# Patient Record
Sex: Male | Born: 1935 | Race: White | Hispanic: No | Smoking: Former smoker
Health system: Southern US, Community
[De-identification: ages and names within clinical notes are randomized; demographics above are authoritative.]

## PROBLEM LIST (undated history)

## (undated) DIAGNOSIS — I639 Cerebral infarction, unspecified: Secondary | ICD-10-CM

## (undated) DIAGNOSIS — M1990 Unspecified osteoarthritis, unspecified site: Secondary | ICD-10-CM

## (undated) DIAGNOSIS — C61 Malignant neoplasm of prostate: Secondary | ICD-10-CM

## (undated) DIAGNOSIS — F0390 Unspecified dementia without behavioral disturbance: Secondary | ICD-10-CM

## (undated) DIAGNOSIS — K819 Cholecystitis, unspecified: Secondary | ICD-10-CM

## (undated) DIAGNOSIS — K810 Acute cholecystitis: Secondary | ICD-10-CM

## (undated) DIAGNOSIS — C4491 Basal cell carcinoma of skin, unspecified: Secondary | ICD-10-CM

## (undated) DIAGNOSIS — S0990XA Unspecified injury of head, initial encounter: Secondary | ICD-10-CM

## (undated) DIAGNOSIS — F329 Major depressive disorder, single episode, unspecified: Secondary | ICD-10-CM

## (undated) DIAGNOSIS — D4989 Neoplasm of unspecified behavior of other specified sites: Secondary | ICD-10-CM

## (undated) DIAGNOSIS — F32A Depression, unspecified: Secondary | ICD-10-CM

## (undated) DIAGNOSIS — D150 Benign neoplasm of thymus: Secondary | ICD-10-CM

## (undated) DIAGNOSIS — E213 Hyperparathyroidism, unspecified: Secondary | ICD-10-CM

## (undated) HISTORY — DX: Benign neoplasm of thymus: D15.0

## (undated) HISTORY — DX: Neoplasm of unspecified behavior of other specified sites: D49.89

## (undated) HISTORY — PX: PROSTATECTOMY: SHX69

## (undated) HISTORY — DX: Acute cholecystitis: K81.0

---

## 1992-07-26 HISTORY — PX: COLON SURGERY: SHX602

## 2007-10-29 ENCOUNTER — Inpatient Hospital Stay (HOSPITAL_COMMUNITY): Admission: EM | Admit: 2007-10-29 | Discharge: 2007-11-01 | Payer: Self-pay | Admitting: Emergency Medicine

## 2007-10-29 ENCOUNTER — Ambulatory Visit: Payer: Self-pay | Admitting: Psychiatry

## 2007-10-30 ENCOUNTER — Encounter (INDEPENDENT_AMBULATORY_CARE_PROVIDER_SITE_OTHER): Payer: Self-pay | Admitting: Internal Medicine

## 2007-11-01 ENCOUNTER — Ambulatory Visit: Payer: Self-pay | Admitting: Psychiatry

## 2010-12-08 NOTE — Discharge Summary (Signed)
NAME:  Jose Crawford, Jose Crawford NO.:  [id number]   MEDICAL RECORD NO.:  [id number]          PATIENT TYPE:  INP   LOCATION:  1436                         FACILITY:  [facility]   PHYSICIAN:  Madaline Savage, MD        DATE OF BIRTH:  1925/12/16   DATE OF ADMISSION:  10/29/2007  DATE OF DISCHARGE:  11/01/2007                               DISCHARGE SUMMARY   ADDENDUM:  Please see the last discharge summary for a complete list of  discharge diagnoses and discharge medications.   Mr. Rodgers was admitted here with altered mental status and depression.  He was initially seen by psychiatry, Dr. Electa Sniff, and at that time he  agreed for a voluntary inpatient psych admission, but at this time the  patient has changed his mind and he would like to go home.  I discussed  this with Dr. Electa Sniff, the psychiatrist, who recommended that he can be  discharged home if he is not suicidal.  I have discussed it with the  patient.  He states his depression has improved tremendously.  He states  he has a lot of support at home and he will be watched closely at home.  He denies being suicidal.  He feels he will be all right if he goes  home.  I have also discussed it with his daughter, [name], and she also  agrees to keep a close eye on him.  He has an appointment to see the  psychiatrist on April 14.  He is now being discharged to home in stable  condition.      Madaline Savage, MD  Electronically Signed     PKN/MEDQ  D:  11/01/2007  T:  11/01/2007  Job:  [id number]

## 2010-12-08 NOTE — Discharge Summary (Signed)
NAMEMarland Kitchen  Jose Crawford, Jose Crawford NO.:  [id number]   MEDICAL RECORD NO.:  [id number]          PATIENT TYPE:  INP   LOCATION:  1436                         FACILITY:  [facility]   PHYSICIAN:  Ladell Pier, M.D.   DATE OF BIRTH:  05-Aug-1925   DATE OF ADMISSION:  10/29/2007  DATE OF DISCHARGE:  11/01/2007                               DISCHARGE SUMMARY   DISCHARGE DIAGNOSIS:  1. Confusion - altered mental status.  2. Severe depression.  3. History of prostate cancer in the past.  4. Previous head injury.  5. Mildly elevated relative index on the cardiac enzymes.  No chest      pain, no shortness of breath.   DISCHARGE MEDICATIONS:  1. Lexapro discontinued. Discharge psych meds to be determined by      psychiatry.  2. Oxybutynin 5 mg t.i.d.  3. Zantac 150 mg daily.  4. Aspirin 81 mg daily.  5. Trazodone 100 mg q.h.s.   FOLLOW-UP APPOINTMENTS:  The patient had an appointment to follow up  with his psychiatrist on April 14. The patient to follow up with primary  care physician in 1 week.  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   PROCEDURES:  None.   CONSULTANTS:  Psychiatry.   HISTORY OF PRESENT ILLNESS:  The patient is an 77 year old white male  that was brought to the emergency department secondary to worsening  confusion and depressed mood.  His family reports that he has had  progressive symptoms since he was placed on Lexapro when he was seen at  [facility] in [place].  The patient reports feeling  severely depressed and reports hurting all over and cannot localize the  pain.  The patient's wife is very ill with cancer and since then the  patient was the primary caretaker for his wife and his wife is now in  the hospital at [facility].  Please see admission note for remainder of  history, past medical history, family history, social history, meds,  allergies, review of systems per admission H&P.   PHYSICAL EXAMINATION ON DISCHARGE:  Temperature  98.1, pulse 80,  respirations 20, blood pressure 106/69, pulse ox 95% on room air.  HEENT:  Normocephalic atraumatic.  Pupils reactive to light. Throat  without erythema.  CARDIOVASCULAR:  Regular rate and rhythm.  LUNGS:  Clear bilaterally.  ABDOMEN:  Positive bowel sounds.  EXTREMITIES:  No edema.  NEUROLOGIC:  Nonfocal.   HOSPITAL COURSE:  Confusion/severe depression:  The patient was admitted  to the hospital and he had labs work done to rule out metabolic causes  for his mental status changes.  He also had a CT scan that recommended  follow-up MRI.  The followup MRI/MRA did not show any acute stroke but  old cerebellar infarct.  He had mild __________ .  Discussed with  daughter to follow up with this as outpatient.  The patient does not  have any chest pain or shortness of breath or any cardiac symptoms.  The  patient will also continue taking his oxybutynin.  Will continue the  trazodone.  Will defer  what psych meds to discharge the patient on with  psychiatry. Urinalysis shows 20,000 colonies of multi bacterial  morphogens present. Folate level 938, PT 12.9, INR 1.0.  Alcohol level  less than 5.  CMP, sodium 135, potassium 3.5, chloride 101,  CO2 27,  glucose 114, BUN 14, creatinine 0.95.  LFTs normal.  WBC 6.8, hemoglobin  13.3, platelets 260.  TSH 1.029, troponin 0.03, CK 111, MB 3.3, relative  index of 3.0, RPR nonreactive.  Vitamin B12 level 302.  Troponin level  0.02. CK 126, MB 4.4, relative index 3.5. First set of enzymes, CK 121,  MB 4.6. Relative index 3.8. Urinalysis negative. MRI/MRA of the brain  showed no acute abnormality, remote infarct involving the right  cerebellum.      Ladell Pier, M.D.  Electronically Signed     NJ/MEDQ  D:  10/31/2007  T:  10/31/2007  Job:  [id number]

## 2010-12-08 NOTE — H&P (Signed)
NAME:  Jose Crawford, Jose Crawford NO.:  [id number]   MEDICAL RECORD NO.:  [id number]          PATIENT TYPE:  INP   LOCATION:  1436                         FACILITY:  [facility]   PHYSICIAN:  Della Goo, M.D. DATE OF BIRTH:  [birth year]   DATE OF ADMISSION:  10/29/2007  DATE OF DISCHARGE:                              HISTORY & PHYSICAL   PRIMARY CARE PHYSICIAN:  Unassigned.  This patient receives his medical  care at the [facility].   CHIEF COMPLAINT:  Increased confusion.   HISTORY OF PRESENT ILLNESS:  This is an 77 year old male who was brought  to the emergency department secondary to worsening confusion and  depressed mood.  His family reports he has had progressive symptoms  since he was placed on Lexapro medication when he was seen at a  [facility] in [place].  The patient, himself,  reports feeling severely depressed and reports hurting all over and he  cannot localized pain, but reports he hurts in the inside.  Also of  note, the patient's wife has become severely ill recently and this  patient was the primary caretaker for his wife, who is now hospitalized  in [facility].  The patient also reports having decreased p.o.  intake of food and fluids secondary to his depressed mood.  The patient  denies being suicidal or having any kind of suicidal ideation.   PAST MEDICAL HISTORY:  Significant for:  1. History of prostate cancer in the past.  2. Previous head injury.  3. History of depression.   MEDICATIONS:  Will be further verified.   ALLERGIES:  NO KNOWN DRUG ALLERGIES.   SOCIAL HISTORY:  The patient is a nonsmoker, nondrinker   FAMILY HISTORY:  Noncontributory.   REVIEW OF SYSTEMS:  The patient denies having any shortness of breath.  He does report having weakness.  He denies having any nausea, vomiting,  diarrhea or constipation.  He denies chest pain, but does report having  pain all over which has not been quantified or  qualified.   PHYSICAL EXAMINATION:  GENERAL:  This is an 77 year old, well-nourished,  well-developed male who is tearful, but in no acute distress.  VITAL SIGNS:  Temperature 97.6, blood pressure 118/65, heart rate 68,  respirations 20, O2 saturations 97% on room air. HEENT:  Normocephalic,  atraumatic.  There is no scleral icterus.  Pupils are equally round and  reactive to light.  Extraocular muscles are intact.  Funduscopic benign.  Oropharynx is clear.  NECK:  Supple.  Full range of motion.  No thyromegaly, adenopathy or  jugulovenous distention.  CARDIOVASCULAR:  Regular rate and rhythm.  No murmurs, gallops or rubs.  LUNGS:  Clear to auscultation bilaterally.  ABDOMEN:  Positive bowel sounds.  Soft, nontender and nondistended.  EXTREMITIES:  Without cyanosis, clubbing or edema.  NEUROLOGIC:  The patient's mood is severely depressed.  His speech is  clear.  There are signs of psychomotor retardation, but otherwise his  examination is nonfocal.   LABORATORY STUDIES:  Chemistry with a sodium of 135, potassium 3.5,  chloride 101, bicarb 27, BUN 14,  creatinine 0.95 and glucose 114.  Albumin 3.6, AST 21, ALT 14.  Pro time 12.9, INR 1.0.  Urinalysis  negative.  Cardiac enzymes with a myoglobin of 114, CK-MB of 2.2 and  troponin less than 0.05.   ASSESSMENT:  This is an 77 year old male being admitted with:  1. Confusion/altered mental status.  2. Severe depression.  3. Mild dehydration.   PLAN:  The patient will be admitted to a telemetry area and cardiac  enzymes will continue to be performed.  IV fluids have been ordered for  rehydration and maintenance therapy.  His medications will be placed on  hold for now.  No further Lexapro will be ordered until further  psychiatric evaluation.  A delirium dementia workup will also be  initiated.  DVT and GI prophylaxis have been ordered.      Della Goo, M.D.  Electronically Signed     HJ/MEDQ  D:  10/29/2007  T:   10/30/2007  Job:  [id number]

## 2010-12-08 NOTE — Consult Note (Signed)
NAME:  Jose Crawford, Jose Crawford NO.:  [id number]   MEDICAL RECORD NO.:  [id number]          PATIENT TYPE:  INP   LOCATION:  1436                         FACILITY:  [facility]   PHYSICIAN:  Anselm Jungling, MD  DATE OF BIRTH:  November 14, 1925   DATE OF CONSULTATION:  11/01/2007  DATE OF DISCHARGE:                                 CONSULTATION   IDENTIFYING DATA AND REASON FOR REFERRAL:  The patient is an 77 year old  married Caucasian male currently being cared for by the [facility]  here at [facility].  He was admitted due to confusion and  altered mental status.  Psychiatric consultation is requested to assess  mental status and make recommendations.   HISTORY OF THE PRESENTING PROBLEMS:  The patient, for unclear reasons,  was started on a trial of Lexapro, the antidepressant, some time in the  recent past, at a behavioral health center in [facility].  This may  have been the [employer].  He had been feeling depressed in  relation to his wife being hospitalized with a severe illness.  He had  had decreased intake of food and fluid.  Apparently, the patient  experienced further deterioration in mental status changes in relation  to Lexapro therapy.  He was admitted here to [facility]  because of his alterations in mental status, and failure to thrive.   He also has a history of prostate cancer in the past, old head injury,  but otherwise is in reasonably good health for a gentleman his age.  Consultation was requested to assess mental status and make  recommendations around the issue of depression and medication for  depression.   MENTAL STATUS AND OBSERVATIONS:  I met the patient on the third hospital  day, as he was preparing to be discharged home.  He was getting dressed,  and a sitter and was nearby.  He is alert, fully oriented, and very  pleasant and friendly.  He was an extremely pleasant gentleman to talk  to.  He appeared to be  in good spirits, and stated that he felt very  well.  He talked about how he believes that the Lexapro was a medication  that had detrimental effects, and that he feels much better now that he  is off it.   We talked about his background and his life.  He lives with his wife and  other family.  He is retired, but still has a lot of energy, and wishes  he could still work.   There was nothing to suggest any underlying psychosis, thought disorder,  confusion, delirium or any other cognitive abnormality.  He is looking  forward to going home.  He denies current depression and suicidal  ideation.   Gentleman who apparently became somewhat depressed in relation to his  wife's severe illness.  He then seemed to have a adverse response to  Lexapro, with alterations in mental status and failure to thrive.  He  appears to be doing very well right now.  I do not see any indication  for any further medication for  depression, both on the basis of his  clinical appearance, and the worrisome reaction that he had to Lexapro.   He does not really appear to need any particular outpatient psychiatric  followup at this time, although it could certainly be an option if his  depression were to return.   CT and MRI scan did show evidence of old cerebellar infarct, but no  acute changes.  There is nothing to suggest any kind of acute process  neurological or psychological.  As such, I am comfortable with the  patient proceeding with his discharge home today.   DIAGNOSTIC IMPRESSION:  AXIS I:  Adjustment disorder with depressed  mood, status post confusion, secondary to medication.  AXIS II:  Deferred.  AXIS III:  History of prostate cancer.  AXIS IV:  Stressors, severe.  AXIS V:  GAF 75.   RECOMMENDATIONS:  As above.  Thank you for involving me in this  patient's care.  Cell phone [phone].      Anselm Jungling, MD  Electronically Signed     SPB/MEDQ  D:  11/01/2007  T:  11/01/2007  Job:   [id number]

## 2011-04-20 LAB — COMPREHENSIVE METABOLIC PANEL
ALT: 14
AST: 21
Albumin: 3.6
Alkaline Phosphatase: 60
GFR calc Af Amer: >60
Potassium: 3.5
Sodium: 135
Total Protein: 5.8 — ABNORMAL LOW

## 2011-04-20 LAB — CK TOTAL AND CKMB (NOT AT ARMC)
CK, MB: 3.3
CK, MB: 4.4 — ABNORMAL HIGH
Relative Index: 3 — ABNORMAL HIGH
Relative Index: 3.8 — ABNORMAL HIGH

## 2011-04-20 LAB — FOLATE RBC: RBC Folate: 938 — ABNORMAL HIGH

## 2011-04-20 LAB — DIFFERENTIAL
Basophils Relative: 0
Monocytes Absolute: 0.5
Monocytes Relative: 8
Neutro Abs: 5.1

## 2011-04-20 LAB — ETHANOL: Alcohol, Ethyl (B): <5

## 2011-04-20 LAB — URINALYSIS, ROUTINE W REFLEX MICROSCOPIC
Protein, ur: NEGATIVE
Urobilinogen, UA: 1

## 2011-04-20 LAB — URINE CULTURE

## 2011-04-20 LAB — CBC
Platelets: 260
RDW: 13.1

## 2011-04-20 LAB — POCT CARDIAC MARKERS: CKMB, poc: 2.2

## 2011-04-20 LAB — TROPONIN I
Troponin I: 0.02
Troponin I: 0.03

## 2011-04-20 LAB — RPR: RPR Ser Ql: NONREACTIVE

## 2011-11-24 DIAGNOSIS — K810 Acute cholecystitis: Secondary | ICD-10-CM

## 2011-11-24 HISTORY — DX: Acute cholecystitis: K81.0

## 2011-12-02 ENCOUNTER — Emergency Department (HOSPITAL_COMMUNITY): Payer: Medicare Other

## 2011-12-02 ENCOUNTER — Inpatient Hospital Stay (HOSPITAL_COMMUNITY)
Admission: EM | Admit: 2011-12-02 | Discharge: 2011-12-09 | DRG: 444 | Disposition: A | Discharge status: Home / Self Care | Payer: Medicare Other | Attending: Internal Medicine | Admitting: Internal Medicine

## 2011-12-02 ENCOUNTER — Inpatient Hospital Stay (HOSPITAL_COMMUNITY): Payer: Medicare Other

## 2011-12-02 ENCOUNTER — Encounter (HOSPITAL_COMMUNITY): Payer: Self-pay

## 2011-12-02 DIAGNOSIS — N200 Calculus of kidney: Secondary | ICD-10-CM | POA: Diagnosis present

## 2011-12-02 DIAGNOSIS — K810 Acute cholecystitis: Secondary | ICD-10-CM

## 2011-12-02 DIAGNOSIS — I509 Heart failure, unspecified: Secondary | ICD-10-CM | POA: Diagnosis present

## 2011-12-02 DIAGNOSIS — R222 Localized swelling, mass and lump, trunk: Secondary | ICD-10-CM | POA: Diagnosis present

## 2011-12-02 DIAGNOSIS — C61 Malignant neoplasm of prostate: Secondary | ICD-10-CM | POA: Diagnosis present

## 2011-12-02 DIAGNOSIS — R918 Other nonspecific abnormal finding of lung field: Secondary | ICD-10-CM | POA: Diagnosis present

## 2011-12-02 DIAGNOSIS — A419 Sepsis, unspecified organism: Secondary | ICD-10-CM

## 2011-12-02 DIAGNOSIS — F0390 Unspecified dementia without behavioral disturbance: Secondary | ICD-10-CM | POA: Diagnosis present

## 2011-12-02 DIAGNOSIS — I5042 Chronic combined systolic (congestive) and diastolic (congestive) heart failure: Secondary | ICD-10-CM | POA: Diagnosis present

## 2011-12-02 DIAGNOSIS — D72829 Elevated white blood cell count, unspecified: Secondary | ICD-10-CM | POA: Diagnosis present

## 2011-12-02 DIAGNOSIS — Z8546 Personal history of malignant neoplasm of prostate: Secondary | ICD-10-CM

## 2011-12-02 DIAGNOSIS — K8000 Calculus of gallbladder with acute cholecystitis without obstruction: Secondary | ICD-10-CM

## 2011-12-02 DIAGNOSIS — Z87891 Personal history of nicotine dependence: Secondary | ICD-10-CM

## 2011-12-02 HISTORY — DX: Basal cell carcinoma of skin, unspecified: C44.91

## 2011-12-02 HISTORY — DX: Malignant neoplasm of prostate: C61

## 2011-12-02 LAB — CBC
HCT: 40.2 % (ref 39.0–52.0)
Hemoglobin: 13.7 g/dL (ref 13.0–17.0)
MCH: 31.4 pg (ref 26.0–34.0)
MCHC: 34.1 g/dL (ref 30.0–36.0)
MCV: 92 fL (ref 78.0–100.0)

## 2011-12-02 LAB — URINALYSIS, ROUTINE W REFLEX MICROSCOPIC
Glucose, UA: NEGATIVE mg/dL
Leukocytes, UA: NEGATIVE
Protein, ur: 30 mg/dL — AB

## 2011-12-02 LAB — DIFFERENTIAL
Basophils Relative: 0 % (ref 0–1)
Eosinophils Absolute: 0 10*3/uL (ref 0.0–0.7)
Eosinophils Relative: 0 % (ref 0–5)
Monocytes Absolute: 1.4 10*3/uL — ABNORMAL HIGH (ref 0.1–1.0)
Monocytes Relative: 7 % (ref 3–12)

## 2011-12-02 LAB — COMPREHENSIVE METABOLIC PANEL
Albumin: 3.6 g/dL (ref 3.5–5.2)
BUN: 18 mg/dL (ref 6–23)
Chloride: 99 mEq/L (ref 96–112)
Creatinine, Ser: 1.05 mg/dL (ref 0.50–1.35)
Total Bilirubin: 1.3 mg/dL — ABNORMAL HIGH (ref 0.3–1.2)
Total Protein: 6.6 g/dL (ref 6.0–8.3)

## 2011-12-02 LAB — URINE MICROSCOPIC-ADD ON

## 2011-12-02 LAB — LACTIC ACID, PLASMA: Lactic Acid, Venous: 1.5 mmol/L (ref 0.5–2.2)

## 2011-12-02 LAB — LIPASE, BLOOD: Lipase: 13 U/L (ref 11–59)

## 2011-12-02 MED ORDER — ONDANSETRON HCL 4 MG/2ML IJ SOLN: 4.0000 mg | Freq: Four times a day (QID) | INTRAMUSCULAR | Status: DC | PRN

## 2011-12-02 MED ORDER — KCL IN DEXTROSE-NACL 20-5-0.45 MEQ/L-%-% IV SOLN
INTRAVENOUS | Status: DC
  Administered 2011-12-02 – 2011-12-05 (×3): via INTRAVENOUS
  Filled 2011-12-02 (×9): qty 1000

## 2011-12-02 MED ORDER — DIPHENHYDRAMINE HCL 12.5 MG/5ML PO ELIX
12.5000 mg | ORAL_SOLUTION | Freq: Four times a day (QID) | ORAL | Status: DC | PRN
  Filled 2011-12-02: qty 5

## 2011-12-02 MED ORDER — DIPHENHYDRAMINE HCL 50 MG/ML IJ SOLN: 12.5000 mg | Freq: Four times a day (QID) | INTRAMUSCULAR | Status: DC | PRN

## 2011-12-02 MED ORDER — SODIUM CHLORIDE 0.9 % IV BOLUS (SEPSIS)
1000.0000 mL | Freq: Once | INTRAVENOUS | Status: AC
  Administered 2011-12-02: 1000 mL via INTRAVENOUS

## 2011-12-02 MED ORDER — PANTOPRAZOLE SODIUM 40 MG IV SOLR
40.0000 mg | Freq: Every day | INTRAVENOUS | Status: DC
  Administered 2011-12-02 – 2011-12-05 (×4): 40 mg via INTRAVENOUS
  Filled 2011-12-02 (×6): qty 40

## 2011-12-02 MED ORDER — PIPERACILLIN-TAZOBACTAM 3.375 G IVPB
3.3750 g | Freq: Once | INTRAVENOUS | Status: AC
  Administered 2011-12-02: 3.375 g via INTRAVENOUS
  Filled 2011-12-02: qty 50

## 2011-12-02 MED ORDER — ACETAMINOPHEN 325 MG PO TABS
650.0000 mg | ORAL_TABLET | Freq: Once | ORAL | Status: AC
  Administered 2011-12-02: 650 mg via ORAL
  Filled 2011-12-02: qty 2

## 2011-12-02 MED ORDER — ONDANSETRON HCL 4 MG/2ML IJ SOLN
4.0000 mg | Freq: Once | INTRAMUSCULAR | Status: AC
  Administered 2011-12-02: 4 mg via INTRAVENOUS
  Filled 2011-12-02: qty 2

## 2011-12-02 MED ORDER — IOHEXOL 300 MG/ML  SOLN
100.0000 mL | Freq: Once | INTRAMUSCULAR | Status: AC | PRN
  Administered 2011-12-02: 100 mL via INTRAVENOUS

## 2011-12-02 MED ORDER — MORPHINE SULFATE 2 MG/ML IJ SOLN
1.0000 mg | INTRAMUSCULAR | Status: DC | PRN
  Administered 2011-12-02 – 2011-12-08 (×8): 2 mg via INTRAVENOUS
  Filled 2011-12-02 (×8): qty 1

## 2011-12-02 MED ORDER — IOHEXOL 300 MG/ML  SOLN
20.0000 mL | INTRAMUSCULAR | Status: AC
  Administered 2011-12-02 (×2): 20 mL via ORAL

## 2011-12-02 MED ORDER — SODIUM CHLORIDE 0.9 % IV SOLN
Freq: Once | INTRAVENOUS | Status: AC
  Administered 2011-12-02: 17:00:00 via INTRAVENOUS

## 2011-12-02 MED ORDER — PIPERACILLIN-TAZOBACTAM 3.375 G IVPB
3.3750 g | Freq: Three times a day (TID) | INTRAVENOUS | Status: DC
  Administered 2011-12-02 – 2011-12-05 (×9): 3.375 g via INTRAVENOUS
  Filled 2011-12-02 (×11): qty 50

## 2011-12-02 NOTE — ED Provider Notes (Signed)
History     CSN: [id number]  Arrival date & time 12/02/11  1231   First MD Initiated Contact with Patient 12/02/11 1243      Chief Complaint  Patient presents with  . Shortness of Breath    (Consider location/radiation/quality/duration/timing/severity/associated sxs/prior treatment) HPI Comments: Patient presents with 2 days of abdominal pain, shortness of breath, vomiting. Had 2 episodes of vomiting yesterday morning but none since. He complains of right-sided abdominal pain shortness of breath and cough. He denies any chest pain. Do not think any fevers at home. He is a history of prostate cancer and skin cancers. He denies any leg pain or swelling. He admits to poor by mouth intake denies any diarrhea or constipation.  The history is provided by the patient and a relative.    Past Medical History  Diagnosis Date  . Basal cell carcinoma   . Prostate cancer     History reviewed. No pertinent past surgical history.  History reviewed. No pertinent family history.  History  Substance Use Topics  . Smoking status: Never Smoker   . Smokeless tobacco: Not on file  . Alcohol Use: No      Review of Systems  Constitutional: Positive for fever, activity change, appetite change and fatigue.  HENT: Negative for congestion and rhinorrhea.   Eyes: Negative for visual disturbance.  Respiratory: Positive for cough, chest tightness and shortness of breath.   Cardiovascular: Negative for chest pain.  Gastrointestinal: Positive for nausea, vomiting and abdominal pain.  Genitourinary: Negative for dysuria and hematuria.  Musculoskeletal: Negative for back pain.  Skin: Negative for rash.  Neurological: Negative for dizziness and headaches.    Allergies  Review of patient's allergies indicates no known allergies.  Home Medications   Current Outpatient Rx  Name Route Sig Dispense Refill  . ACETAMINOPHEN 500 MG PO TABS Oral Take 1,000 mg by mouth every 6 (six) hours as needed. For  pain      BP 110/62  Pulse 89  Temp(Src) 98 F (36.7 C) (Oral)  Resp 20  SpO2 95%  Physical Exam  Constitutional: He is oriented to person, place, and time. He appears well-developed and well-nourished. No distress.  HENT:  Head: Normocephalic and atraumatic.  Mouth/Throat: Oropharynx is clear and moist. No oropharyngeal exudate.  Eyes: Conjunctivae are normal. Pupils are equal, round, and reactive to light.  Neck: Normal range of motion. Neck supple.       No meningismus  Cardiovascular: Normal rate, regular rhythm and normal heart sounds.   Pulmonary/Chest: Effort normal and breath sounds normal. No respiratory distress.  Abdominal: Soft. There is tenderness. There is guarding. There is no rebound.       TTP RUQ, periumbilical, RLQ  Musculoskeletal: He exhibits no edema and no tenderness.  Neurological: He is alert and oriented to person, place, and time. No cranial nerve deficit.  Skin: Skin is warm.    ED Course  Procedures (including critical care time)  Labs Reviewed  CBC - Abnormal; Notable for the following:    WBC 20.9 (*)    All other components within normal limits  DIFFERENTIAL - Abnormal; Notable for the following:    Neutrophils Relative 89 (*)    Neutro Abs 18.5 (*)    Lymphocytes Relative 5 (*)    Monocytes Absolute 1.4 (*)    All other components within normal limits  COMPREHENSIVE METABOLIC PANEL - Abnormal; Notable for the following:    Sodium 134 (*)    Glucose, Bld 118 (*)  Calcium 11.5 (*)    Total Bilirubin 1.3 (*)    GFR calc non Af Amer 63 (*)    GFR calc Af Amer 73 (*)    All other components within normal limits  URINALYSIS, ROUTINE W REFLEX MICROSCOPIC - Abnormal; Notable for the following:    Color, Urine AMBER (*) BIOCHEMICALS MAY BE AFFECTED BY COLOR   APPearance HAZY (*)    Hgb urine dipstick LARGE (*)    Ketones, ur 15 (*)    Protein, ur 30 (*)    All other components within normal limits  URINE MICROSCOPIC-ADD ON - Abnormal;  Notable for the following:    Casts HYALINE CASTS (*)    All other components within normal limits  TROPONIN I  LACTIC ACID, PLASMA  PROTIME-INR  LIPASE, BLOOD   US Abdomen Complete  12/02/2011  *RADIOLOGY REPORT*  Clinical Data:  Right upper quadrant pain and fever  ABDOMINAL ULTRASOUND COMPLETE  Comparison:  None.  Findings:  Gallbladder:   There are multiple small gallstones and tumefactive sludge within the gallbladder lumen.  There is gallbladder wall thickening and pericholecystic fluid.  There is dirty shadowing from a portion of the gallbladder wall which is worrisome for gas.  Common Bile Duct:  Within normal limits in caliber.  Measures 4 mm.  Liver: No focal mass lesion identified.  Within normal limits in parenchymal echogenicity.  IVC:  Appears normal.  Pancreas:  No abnormality identified.  Spleen:  Within normal limits in size and echotexture.  Right kidney:  Normal in size and parenchymal echogenicity.  No evidence of mass or hydronephrosis.  Left kidney:  Normal in size and parenchymal echogenicity.  No evidence of mass or hydronephrosis.  Abdominal Aorta:  No aneurysm identified.  IMPRESSION: Cholelithiasis with cholecystitis.  Gas within a portion of the gallbladder wall is also suspected, consistent with emphysematous cholecystitis. Discussed with Dr. Manus Gunning.  Original Report Authenticated By: Brandon Melnick, M.D.   Dg Chest Portable 1 View  12/02/2011  *RADIOLOGY REPORT*  Clinical Data: Shortness of breath.  PORTABLE CHEST - 1 VIEW 12/02/2011 1324 hours:  Comparison: None.  Findings: Cardiac silhouette mildly enlarged even allowing for the AP portable technique, with mildly prominent central pulmonary arteries.  Thoracic aorta atherosclerotic.  Elevation of the right hemidiaphragm.  Linear opacities in both lung bases.  Lungs otherwise clear.  Pulmonary vascularity normal.  No visible pleural effusions.  IMPRESSION: Mild cardiomegaly.  Linear scar or atelectasis in the lung bases. No  acute cardiopulmonary disease otherwise.  Original Report Authenticated By: Arnell Sieving, M.D.     1. Emphysematous cholecystitis       MDM  Fever, abdominal pain, shortness of breath and vomiting. Right upper quadrant tenderness on exam.  Abnormal appearing mediastinum on chest x-ray.  Ultrasound shows cholecystitis with gas the gallbladder wall worrisome for emphysematous cholecystitis. Patient is given antibiotics, pain medication, surgical consult.  CT abdomen and chest pending at time of admission.    Date: 12/02/2011  Rate: 93  Rhythm: normal sinus rhythm  QRS Axis: normal  Intervals: normal  ST/T Wave abnormalities: normal  Conduction Disutrbances:none  Narrative Interpretation:   Old EKG Reviewed: unchanged  CRITICAL CARE Performed by: Glynn Octave   Total critical care time: 40  Critical care time was exclusive of separately billable procedures and treating other patients.  Critical care was necessary to treat or prevent imminent or life-threatening deterioration.  Critical care was time spent personally by me on the following activities: development  of treatment plan with patient and/or surrogate as well as nursing, discussions with consultants, evaluation of patient's response to treatment, examination of patient, obtaining history from patient or surrogate, ordering and performing treatments and interventions, ordering and review of laboratory studies, ordering and review of radiographic studies, pulse oximetry and re-evaluation of patient's condition.   Glynn Octave, MD 12/03/11 [phone]

## 2011-12-02 NOTE — Progress Notes (Signed)
ANTIBIOTIC CONSULT NOTE - INITIAL  Pharmacy Consult for Zosyn Indication: Cholecystitis  No Known Allergies  Patient Measurements:   Adjusted Body Weight  Vital Signs: Temp: 99 F (37.2 C) (05/09 1920) Temp src: Oral (05/09 1920) BP: 124/70 mmHg (05/09 1920) Pulse Rate: 91  (05/09 1920) Intake/Output from previous day:   Intake/Output from this shift:    Labs:  Basename 12/02/11 1300  WBC 20.9*  HGB 13.7  PLT 206  LABCREA --  CREATININE 1.05   CrCl is unknown because there is no height on file for the current visit. No results found for this basename: VANCOTROUGH:2,VANCOPEAK:2,VANCORANDOM:2,GENTTROUGH:2,GENTPEAK:2,GENTRANDOM:2,TOBRATROUGH:2,TOBRAPEAK:2,TOBRARND:2,AMIKACINPEAK:2,AMIKACINTROU:2,AMIKACIN:2, in the last 72 hours  Est CrCl ~50-55 ml/min  Microbiology: No results found for this or any previous visit (from the past 720 hour(s)).  Medical History: Past Medical History  Diagnosis Date  . Basal cell carcinoma   . Prostate cancer     Medications:  Anti-infectives     Start     Dose/Rate Route Frequency Ordered Stop   12/02/11 1545   piperacillin-tazobactam (ZOSYN) IVPB 3.375 g        3.375 g 12.5 mL/hr over 240 Minutes Intravenous  Once 12/02/11 1543           Assessment: Cholecystitis:  To continue empiric antibiotic therapy with Zosyn.  Renal function is normal and no dosage adjustments are anticipated.  Goal of Therapy:  Appropriate antimicrobial therapy  Plan:  Zosyn 3.375gm IV q8h extended infusion As no dosage adjustments are anticipated pharmacy will sign off.  Thank you for the consult.  Estella Husk, Pharm.D., BCPS Clinical Pharmacist  Pager [phone] 12/02/2011, 8:36 PM

## 2011-12-02 NOTE — ED Notes (Signed)
[id number] Ready 

## 2011-12-02 NOTE — ED Notes (Signed)
Complains of abd pain, labored breathing, vomiting onset Tuesday morning.

## 2011-12-02 NOTE — ED Notes (Signed)
Family at bedside. 

## 2011-12-02 NOTE — ED Notes (Signed)
MD at bedside. General Surgeon MD at bedside

## 2011-12-02 NOTE — ED Notes (Signed)
Patient transported to CT 

## 2011-12-02 NOTE — H&P (Signed)
Jose Crawford is an 78 y.o. male.   Chief Complaint: stomach pain HPI: This is an 78 year old Caucasian male who was in his normal state of health until around midnight on Wednesday. At that time he complained of stomach pains, nausea, and vomiting. His son-in-law also states that the patient had decreased mobility secondary to the abdominal discomfort. The patient denies any prior symptoms. He denies any fevers or chills. He denies any melena or hematochezia. He reports having a loose bowel movement today. He also reported some shortness of breath. He denies any chest pressure. He denies any weight loss. He lives with his daughter and son-in-law. He generally gets around with a walker. He denies any jaundice. He denies any difficulty swallowing. He denies any heartburn. He doesn't take any scheduled daily medication. Because the patient continued to complain of stomach pains as well as having intermittent episodes of nausea he was brought to the emergency room for further evaluation.  Past Medical History  Diagnosis Date  . Basal cell carcinoma   . Prostate cancer     Past Surgical History  Procedure Date  . Prostatectomy     History reviewed. No pertinent family history. Social History:  reports that he has never smoked. He does not have any smokeless tobacco history on file. He reports that he uses illicit drugs. He reports that he does not drink alcohol.  Allergies: No Known Allergies   (Not in a hospital admission)  Results for orders placed during the hospital encounter of 12/02/11 (from the past 48 hour(s))  TROPONIN I     Status: Normal   Collection Time   12/02/11 12:55 PM      Component Value Range Comment   Troponin I <0.30  <0.30 (ng/mL)   LACTIC ACID, PLASMA     Status: Normal   Collection Time   12/02/11 12:56 PM      Component Value Range Comment   Lactic Acid, Venous 1.5  0.5 - 2.2 (mmol/L)   CBC     Status: Abnormal   Collection Time   12/02/11  1:00 PM      Component  Value Range Comment   WBC 20.9 (*) 4.0 - 10.5 (K/uL)    RBC 4.37  4.22 - 5.81 (MIL/uL)    Hemoglobin 13.7  13.0 - 17.0 (g/dL)    HCT 91.4  78.2 - 95.6 (%)    MCV 92.0  78.0 - 100.0 (fL)    MCH 31.4  26.0 - 34.0 (pg)    MCHC 34.1  30.0 - 36.0 (g/dL)    RDW 21.3  08.6 - 57.8 (%)    Platelets 206  150 - 400 (K/uL)   DIFFERENTIAL     Status: Abnormal   Collection Time   12/02/11  1:00 PM      Component Value Range Comment   Neutrophils Relative 89 (*) 43 - 77 (%)    Neutro Abs 18.5 (*) 1.7 - 7.7 (K/uL)    Lymphocytes Relative 5 (*) 12 - 46 (%)    Lymphs Abs 1.0  0.7 - 4.0 (K/uL)    Monocytes Relative 7  3 - 12 (%)    Monocytes Absolute 1.4 (*) 0.1 - 1.0 (K/uL)    Eosinophils Relative 0  0 - 5 (%)    Eosinophils Absolute 0.0  0.0 - 0.7 (K/uL)    Basophils Relative 0  0 - 1 (%)    Basophils Absolute 0.0  0.0 - 0.1 (K/uL)   COMPREHENSIVE METABOLIC PANEL  Status: Abnormal   Collection Time   12/02/11  1:00 PM      Component Value Range Comment   Sodium 134 (*) 135 - 145 (mEq/L)    Potassium 4.7  3.5 - 5.1 (mEq/L)    Chloride 99  96 - 112 (mEq/L)    CO2 24  19 - 32 (mEq/L)    Glucose, Bld 118 (*) 70 - 99 (mg/dL)    BUN 18  6 - 23 (mg/dL)    Creatinine, Ser 1.61  0.50 - 1.35 (mg/dL)    Calcium 09.6 (*) 8.4 - 10.5 (mg/dL)    Total Protein 6.6  6.0 - 8.3 (g/dL)    Albumin 3.6  3.5 - 5.2 (g/dL)    AST 23  0 - 37 (U/L)    ALT 10  0 - 53 (U/L)    Alkaline Phosphatase 60  39 - 117 (U/L)    Total Bilirubin 1.3 (*) 0.3 - 1.2 (mg/dL)    GFR calc non Af Amer 63 (*) >90 (mL/min)    GFR calc Af Amer 73 (*) >90 (mL/min)   PROTIME-INR     Status: Normal   Collection Time   12/02/11  1:00 PM      Component Value Range Comment   Prothrombin Time 15.0  11.6 - 15.2 (seconds)    INR 1.16  0.00 - 1.49    LIPASE, BLOOD     Status: Normal   Collection Time   12/02/11  1:00 PM      Component Value Range Comment   Lipase 13  11 - 59 (U/L)   URINALYSIS, ROUTINE W REFLEX MICROSCOPIC     Status:  Abnormal   Collection Time   12/02/11  2:42 PM      Component Value Range Comment   Color, Urine AMBER (*) YELLOW  BIOCHEMICALS MAY BE AFFECTED BY COLOR   APPearance HAZY (*) CLEAR     Specific Gravity, Urine 1.019  1.005 - 1.030     pH 6.0  5.0 - 8.0     Glucose, UA NEGATIVE  NEGATIVE (mg/dL)    Hgb urine dipstick LARGE (*) NEGATIVE     Bilirubin Urine NEGATIVE  NEGATIVE     Ketones, ur 15 (*) NEGATIVE (mg/dL)    Protein, ur 30 (*) NEGATIVE (mg/dL)    Urobilinogen, UA 1.0  0.0 - 1.0 (mg/dL)    Nitrite NEGATIVE  NEGATIVE     Leukocytes, UA NEGATIVE  NEGATIVE    URINE MICROSCOPIC-ADD ON     Status: Abnormal   Collection Time   12/02/11  2:42 PM      Component Value Range Comment   Squamous Epithelial / LPF RARE  RARE     WBC, UA 0-2  <3 (WBC/hpf)    RBC / HPF 7-10  <3 (RBC/hpf)    Bacteria, UA RARE  RARE     Casts HYALINE CASTS (*) NEGATIVE     Urine-Other MUCOUS PRESENT      US Abdomen Complete  12/02/2011  *RADIOLOGY REPORT*  Clinical Data:  Right upper quadrant pain and fever  ABDOMINAL ULTRASOUND COMPLETE  Comparison:  None.  Findings:  Gallbladder:   There are multiple small gallstones and tumefactive sludge within the gallbladder lumen.  There is gallbladder wall thickening and pericholecystic fluid.  There is dirty shadowing from a portion of the gallbladder wall which is worrisome for gas.  Common Bile Duct:  Within normal limits in caliber.  Measures 4 mm.  Liver: No focal  mass lesion identified.  Within normal limits in parenchymal echogenicity.  IVC:  Appears normal.  Pancreas:  No abnormality identified.  Spleen:  Within normal limits in size and echotexture.  Right kidney:  Normal in size and parenchymal echogenicity.  No evidence of mass or hydronephrosis.  Left kidney:  Normal in size and parenchymal echogenicity.  No evidence of mass or hydronephrosis.  Abdominal Aorta:  No aneurysm identified.  IMPRESSION: Cholelithiasis with cholecystitis.  Gas within a portion of the  gallbladder wall is also suspected, consistent with emphysematous cholecystitis. Discussed with Dr. Manus Gunning.  Original Report Authenticated By: Brandon Melnick, M.D.   Dg Chest Portable 1 View  12/02/2011  *RADIOLOGY REPORT*  Clinical Data: Shortness of breath.  PORTABLE CHEST - 1 VIEW 12/02/2011 1324 hours:  Comparison: None.  Findings: Cardiac silhouette mildly enlarged even allowing for the AP portable technique, with mildly prominent central pulmonary arteries.  Thoracic aorta atherosclerotic.  Elevation of the right hemidiaphragm.  Linear opacities in both lung bases.  Lungs otherwise clear.  Pulmonary vascularity normal.  No visible pleural effusions.  IMPRESSION: Mild cardiomegaly.  Linear scar or atelectasis in the lung bases. No acute cardiopulmonary disease otherwise.  Original Report Authenticated By: Arnell Sieving, M.D.    Review of Systems  Constitutional: Negative for fever, chills and weight loss.       Lives with daughter and son-in-law  HENT: Negative for hearing loss.   Eyes: Negative for double vision.  Respiratory: Positive for shortness of breath. Negative for cough and wheezing.   Cardiovascular: Negative for palpitations, orthopnea, claudication, leg swelling and PND.  Gastrointestinal: Positive for nausea, vomiting and abdominal pain. Negative for constipation and blood in stool.  Genitourinary:       Wears diaper. +incontience. Weak stream.  Musculoskeletal: Negative for back pain and joint pain.  Skin: Negative for rash.  Neurological: Negative for tremors, speech change, seizures, loss of consciousness and headaches.       Denies amaurosis fugax, TIAs  Psychiatric/Behavioral: Negative for substance abuse.    Blood pressure 110/62, pulse 89, temperature 98 F (36.7 C), temperature source Oral, resp. rate 20, SpO2 95.00%. Physical Exam  Vitals reviewed. Constitutional: He appears well-developed and well-nourished. No distress.  HENT:  Head: Normocephalic and  atraumatic.  Right Ear: External ear normal.  Left Ear: External ear normal.  Eyes: Conjunctivae are normal. Pupils are equal, round, and reactive to light. No scleral icterus.  Neck: Normal range of motion. Neck supple. No tracheal deviation present.  Cardiovascular: Normal rate, regular rhythm, normal heart sounds and intact distal pulses.   Respiratory: Effort normal and breath sounds normal. No respiratory distress. He has no wheezes. He exhibits no tenderness.  GI: Soft. Bowel sounds are normal. He exhibits no distension. There is tenderness in the right upper quadrant. There is no rigidity, no rebound and no guarding. No hernia.         No crepitus  Musculoskeletal: Normal range of motion. He exhibits no edema and no tenderness.  Lymphadenopathy:    He has no cervical adenopathy.  Neurological: He is alert. No cranial nerve deficit. He exhibits normal muscle tone.       Knows person, year, president, month, "[place]". Has to think for several secs before answering  Skin: Skin is warm and dry. No rash noted. He is not diaphoretic. No erythema.  Psychiatric: He has a normal mood and affect. His behavior is normal.     Assessment/Plan Acute cholecystitis with calculous, possible emphysematous  cholecystitis  Will admit pt.  Start broad spectrum IV abx NPO, IVF Pt has already drank contrast, (ED has ordered a CT a/p) - will proceed with CT to confirm whether or not there is air in gallbladder - which will help determine timing of surgery. Pt will need cholecystectomy soon. Will discuss case with Dr Janee Morn who is my partner on call tonight.   We discussed gallbladder disease.  We discussed non-operative and operative management.   I discussed laparoscopic cholecystectomy with ioc and OPEN cholecystectomy in detail with the pt and his sons-in-laws..  The patient was shown a diagram detailing the procedure.  We discussed the risks and benefits of a cholecystectomy including, but not  limited to bleeding, infection, injury to surrounding structures such as the intestine or liver, bile leak, retained gallstones, need to convert to an open procedure, prolonged diarrhea, blood clots such as  DVT, common bile duct injury, anesthesia risks (such as heart and lung), and possible need for additional procedures.  We discussed the typical post-operative recovery course. I explained that given the nature of his infection he is higher risk for complications. Mary Sella. Andrey Campanile, MD, FACS General, Bariatric, & Minimally Invasive Surgery [facility], [place]   [facility] M 12/02/2011, 5:31 PM

## 2011-12-03 ENCOUNTER — Inpatient Hospital Stay (HOSPITAL_COMMUNITY): Payer: Medicare Other

## 2011-12-03 ENCOUNTER — Encounter (HOSPITAL_COMMUNITY): Payer: Self-pay | Admitting: Radiology

## 2011-12-03 DIAGNOSIS — F0390 Unspecified dementia without behavioral disturbance: Secondary | ICD-10-CM

## 2011-12-03 DIAGNOSIS — C61 Malignant neoplasm of prostate: Secondary | ICD-10-CM | POA: Diagnosis present

## 2011-12-03 DIAGNOSIS — R918 Other nonspecific abnormal finding of lung field: Secondary | ICD-10-CM | POA: Diagnosis present

## 2011-12-03 DIAGNOSIS — F05 Delirium due to known physiological condition: Secondary | ICD-10-CM

## 2011-12-03 DIAGNOSIS — K810 Acute cholecystitis: Secondary | ICD-10-CM

## 2011-12-03 DIAGNOSIS — K8000 Calculus of gallbladder with acute cholecystitis without obstruction: Secondary | ICD-10-CM | POA: Diagnosis present

## 2011-12-03 DIAGNOSIS — D381 Neoplasm of uncertain behavior of trachea, bronchus and lung: Secondary | ICD-10-CM

## 2011-12-03 LAB — CBC
HCT: 35.6 % — ABNORMAL LOW (ref 39.0–52.0)
Hemoglobin: 12 g/dL — ABNORMAL LOW (ref 13.0–17.0)
MCHC: 33.7 g/dL (ref 30.0–36.0)
MCV: 92 fL (ref 78.0–100.0)
RDW: 14.1 % (ref 11.5–15.5)

## 2011-12-03 LAB — COMPREHENSIVE METABOLIC PANEL
ALT: 10 U/L (ref 0–53)
AST: 25 U/L (ref 0–37)
Alkaline Phosphatase: 62 U/L (ref 39–117)
CO2: 22 mEq/L (ref 19–32)
Chloride: 101 mEq/L (ref 96–112)
GFR calc non Af Amer: 74 mL/min — ABNORMAL LOW (ref >90)
Potassium: 3.9 mEq/L (ref 3.5–5.1)
Sodium: 131 mEq/L — ABNORMAL LOW (ref 135–145)
Total Bilirubin: 0.9 mg/dL (ref 0.3–1.2)

## 2011-12-03 MED ORDER — IOHEXOL 300 MG/ML  SOLN
80.0000 mL | Freq: Once | INTRAMUSCULAR | Status: AC | PRN
  Administered 2011-12-03: 80 mL via INTRAVENOUS

## 2011-12-03 MED ORDER — IOHEXOL 300 MG/ML  SOLN
50.0000 mL | Freq: Once | INTRAMUSCULAR | Status: AC | PRN
  Administered 2011-12-03: 5 mL

## 2011-12-03 MED ORDER — BIOTENE DRY MOUTH MT LIQD
15.0000 mL | Freq: Two times a day (BID) | OROMUCOSAL | Status: DC
  Administered 2011-12-03 – 2011-12-09 (×11): 15 mL via OROMUCOSAL

## 2011-12-03 NOTE — Care Management Note (Signed)
    Page 1 of 2   12/09/2011     1:44:11 PM   CARE MANAGEMENT NOTE 12/09/2011  Patient:  Jose Crawford, Jose Crawford   Account Number:  [id number]  Date Initiated:  12/03/2011  Documentation initiated by:  Donn Pierini  Subjective/Objective Assessment:   Pt admitted with abd pain, cholecystitis     Action/Plan:   PTA pt lived at home with children, surgical consult for lung mass   Anticipated DC Date:  12/09/2011   Anticipated DC Plan:  HOME W HOME HEALTH SERVICES      DC Planning Services  CM consult      [facility] Choice  HOME HEALTH   Choice offered to / List presented to:  C-4 Adult Children   DME arranged  HOSPITAL BED  WALKER - ROLLING  WHEELCHAIR - MANUAL      DME agency  [facility]     HH arranged  HH-1 RN  HH-2 PT  HH-3 OT  HH-4 NURSE'S AIDE  HH-6 SOCIAL WORKER      HH agency  [facility]   Status of service:  Completed, signed off Medicare Important Message given?   (If response is "NO", the following Medicare IM given date fields will be blank) Date Medicare IM given:   Date Additional Medicare IM given:    Discharge Disposition:  HOME W HOME HEALTH SERVICES  Per UR Regulation:    If discussed at Long Length of Stay Meetings, dates discussed:    Comments:  PCP- Benedetto Goad  12/09/11 13:43 Letha Cape RN, BSN [phone] patient dc to home today.  12/08/11 11:48 Letha Cape RN, BSN [phone] patient for dc tomorrow via ambulance transport, CSW aware for am transport.  [facility] will deliver equipment to daughter's home today.  NCM will continue to follow for dc needs. Daughter called PA and would like to see if patient would be candidate for LTAC.  Boneta Lucks with [facility] will come to take a look and let me know if patient would be a candidate.  12/07/11 16:46 Letha Cape RN, BSN [phone] Daughter has decided for patient to go home with [facility] services, she chose [facility] for RN, PT, OT, Aide , SW and rolling walker, w/chair and hospital bed.  Referral  givent to [facility], [facility] and [facility] notified.  Soc will begin 24-48 hrs post dc.  NCM will continue to follow for dc needs.  12/03/11- 1045- Donn Pierini RN, BSN [phone] Attempted to speak with pt at bedside- pt very sleepy. Pt did confirm he lives at home. NCM will continue to follow for potential d/c needs as pt progresses through workups.

## 2011-12-03 NOTE — H&P (View-Only) (Signed)
Subjective: No major issues overnight. C/o of some abd pain  Objective: Vital signs in last 24 hours: Temp:  [97.5 F (36.4 C)-101.1 F (38.4 C)] 97.5 F (36.4 C) (05/10 0351) Pulse Rate:  [83-102] 91  (05/10 0351) Resp:  [16-22] 20  (05/10 0351) BP: (101-130)/(59-75) 116/61 mmHg (05/10 0351) SpO2:  [92 %-97 %] 94 % (05/10 0351) Weight:  [173 lb 8 oz (78.7 kg)] 173 lb 8 oz (78.7 kg) (05/09 2010) Last BM Date: 12/02/11  Intake/Output from previous day: 05/09 0701 - 05/10 0700 In: 1000 [I.V.:1000] Out: 525 [Urine:525] Intake/Output this shift:    Alert, nad. Appriopate. Takes a little while to answer questions.  cta Reg Soft, nd, +RUQ TTP.  Lab Results:   Basename 12/03/11 0420 12/02/11 1300  WBC 17.3* 20.9*  HGB 12.0* 13.7  HCT 35.6* 40.2  PLT 196 206   BMET  Basename 12/03/11 0420 12/02/11 1300  NA 131* 134*  K 3.9 4.7  CL 101 99  CO2 22 24  GLUCOSE 112* 118*  BUN 15 18  CREATININE 0.97 1.05  CALCIUM 10.5 11.5*   PT/INR  Basename 12/02/11 1300  LABPROT 15.0  INR 1.16   ABG No results found for this basename: PHART:2,PCO2:2,PO2:2,HCO3:2 in the last 72 hours  Studies/Results: Us Abdomen Complete  12/02/2011  *RADIOLOGY REPORT*  Clinical Data:  Right upper quadrant pain and fever  ABDOMINAL ULTRASOUND COMPLETE  Comparison:  None.  Findings:  Gallbladder:   There are multiple small gallstones and tumefactive sludge within the gallbladder lumen.  There is gallbladder wall thickening and pericholecystic fluid.  There is dirty shadowing from a portion of the gallbladder wall which is worrisome for gas.  Common Bile Duct:  Within normal limits in caliber.  Measures 4 mm.  Liver: No focal mass lesion identified.  Within normal limits in parenchymal echogenicity.  IVC:  Appears normal.  Pancreas:  No abnormality identified.  Spleen:  Within normal limits in size and echotexture.  Right kidney:  Normal in size and parenchymal echogenicity.  No evidence of mass or  hydronephrosis.  Left kidney:  Normal in size and parenchymal echogenicity.  No evidence of mass or hydronephrosis.  Abdominal Aorta:  No aneurysm identified.  IMPRESSION: Cholelithiasis with cholecystitis.  Gas within a portion of the gallbladder wall is also suspected, consistent with emphysematous cholecystitis. Discussed with Dr. Rancour.  Original Report Authenticated By: CARON B. DOVER, Crawford.D.   Ct Abdomen Pelvis W Contrast  12/02/2011  *RADIOLOGY REPORT*  Clinical Data: Short of breath.  Pain all over.  History of prostate cancer.  CT ABDOMEN AND PELVIS WITH CONTRAST  Technique:  Multidetector CT imaging of the abdomen and pelvis was performed following the standard protocol during bolus administration of intravenous contrast.  Contrast: 100mL OMNIPAQUE IOHEXOL 300 MG/ML  SOLN  Comparison: 12/02/2011 abdominal ultrasound.  Findings: Lung Bases: There is a mass in the inferior left chest abutting the pericardium.  Fat plane between the mass and the left ventricle is effaced.  Severe coronary artery atherosclerosis is present.  The dependent atelectasis is present in the right lung.  Liver:  Tiny left hepatic lobe low density lesions probably representing cysts or hemangioma.  No mass lesion.  Small diaphragmatic lymph node incidentally noted (image 20 series 2).  Spleen:  Normal.  Gallbladder:  Inflamed.  Dependently layering high-density material most compatible with cholelithiasis.  Pericholecystic fluid and fat stranding.  Likely reactive mural thickening of the hepatic flexure of the colon.  Common bile duct:    No intra or extrahepatic biliary ductal dilation.  No common duct stone.  Pancreas:  Normal.  Adrenal glands:  Mild thickening of the left adrenal gland, likely representing hyperplasia.  Kidneys:  Nonobstructing bilateral renal collecting system calculi. Normal renal enhancement.  Largest cluster of calculi in the right inferior renal pole measuring 10 mm x 9 mm.  13 mm left interpolar simple renal  cyst.  Retroaortic left renal vein incidentally noted.  Stomach:  Within normal limits.  Small bowel:  No small bowel obstruction. Penile prosthesis reservoir in the right lower quadrant.  Colon:   Diverticulosis.  Inflammatory changes of the hepatic flexure.  Pelvic Genitourinary:  Urinary bladder appears within normal limits.  Surgical clips in the anatomic pelvis compatible with prostatectomy.  Tubing is present in the right inguinal region compatible with a prosthesis.  Bones:  Lumbar spondylosis and scoliosis.  Degenerative endplate changes.  Lower lumbar facet arthrosis.  No aggressive osseous lesions are identified.  There is no retroperitoneal adenopathy.  No inguinal adenopathy.  Vasculature: There are right iliac atherosclerosis without aneurysm.  No acute vascular abnormality.  IMPRESSION: 1.  Acute cholecystitis with inflammatory changes extending to the hepatic flexure of the colon.  Cholelithiasis.  2.  Nephrolithiasis.  No hydronephrosis.  Renal cyst.  3.  Atherosclerosis and coronary artery disease.  4.  9.5 cm x 6.8 cm mass is partially visualized in the inferior chest.  This would be atypical presentation for metastatic prostate cancer in the absence of bone lesions or retroperitoneal adenopathy.  Primary bronchogenic carcinoma is the top consideration although the appearance is nonspecific.  Original Report Authenticated By: GEOFFREY LAMKE, Crawford.D.   Dg Chest Portable 1 View  12/02/2011  *RADIOLOGY REPORT*  Clinical Data: Shortness of breath.  PORTABLE CHEST - 1 VIEW 12/02/2011 1324 hours:  Comparison: None.  Findings: Cardiac silhouette mildly enlarged even allowing for the AP portable technique, with mildly prominent central pulmonary arteries.  Thoracic aorta atherosclerotic.  Elevation of the right hemidiaphragm.  Linear opacities in both lung bases.  Lungs otherwise clear.  Pulmonary vascularity normal.  No visible pleural effusions.  IMPRESSION: Mild cardiomegaly.  Linear scar or atelectasis  in the lung bases. No acute cardiopulmonary disease otherwise.  Original Report Authenticated By: THOMAS E. LAWRENCE, Crawford.D.    Anti-infectives: Anti-infectives     Start     Dose/Rate Route Frequency Ordered Stop   12/02/11 2200   piperacillin-tazobactam (ZOSYN) IVPB 3.375 g        3.375 g 12.5 mL/hr over 240 Minutes Intravenous 3 times per day 12/02/11 2036     12/02/11 1545   piperacillin-tazobactam (ZOSYN) IVPB 3.375 g        3.375 g 12.5 mL/hr over 240 Minutes Intravenous  Once 12/02/11 1543 12/02/11 2102          Assessment/Plan: Acute cholecystitis - iv abx; given new lung mass, will place cholecystostomy tube to work up lung mass L Lung mass - thoracic surgery consult. Will ask what type of ct they want  Spoke with daughter on phone & informed her lung mass and new plan.   Jose Shellhammer Crawford. Tyresha Fede, Jose Crawford, Jose Crawford General, Bariatric, & Minimally Invasive Surgery [facility]   LOS: 1 day    Jose Crawford 12/03/2011  

## 2011-12-03 NOTE — Progress Notes (Signed)
Patient ID: Jose Crawford  male  MVH:[id number]    DOB: 1926/04/12    DOA: 12/02/2011  PCP: Pamelia Hoit, MD, MD  Interim summary and history of present illness:  Per history of present illness:  This is an 78 year old Caucasian male who was in his normal state of health until around midnight on Wednesday. At that time he complained of stomach pains, nausea, and vomiting. His son-in-law also states that the patient had decreased mobility secondary to the abdominal discomfort. The patient denies any prior symptoms. He denies any fevers or chills. He denies any melena or hematochezia. He reports having a loose bowel movement today. He also reported some shortness of breath. He denies any chest pressure. He denies any weight loss. He lives with his daughter and son-in-law. He generally gets around with a walker. He denies any jaundice. He denies any difficulty swallowing. He denies any heartburn. He doesn't take any scheduled daily medication. Because the patient continued to complain of stomach pains as well as having intermittent episodes of nausea he was brought to the emergency room for further evaluation. Patient was admitted by surgery service on 12/02/2011, he was placed on NPO status, IV fluids and broad-spectrum IV antibiotics. Initial abdominal ultrasound showed calculus cholecystitis with possible emphysematous cholecystitis. CT abdomen pelvis was obtained which confirmed acute cholecystitis however also showed new finding of 9.5 cm x 6.8 cm mass in the inferior chest. Due to new finding of the large mass in the chest, IR was consulted and rather than open or laparoscopic cholecystectomy, patient underwent percutaneous cholecystostomy today. Called by general surgery service, Dr. Andrey Campanile, for hospitalist service to take over care given the new findings of a lung mass and further workup needed.  Subjective: Status post perc cholecystostomy, patient stable, currently denies any complaints, somewhat  confused (per daughter, at baseline)  Objective: Weight change:   Intake/Output Summary (Last 24 hours) at 12/03/11 1458 Last data filed at 12/03/11 0600  Gross per 24 hour  Intake   1000 ml  Output    525 ml  Net    475 ml   Blood pressure 106/65, pulse 86, temperature 98.7 F (37.1 C), temperature source Oral, resp. rate 17, height 6\' 1"  (1.854 m), weight 78.7 kg (173 lb 8 oz), SpO2 100.00%.  Physical Exam: General: Alert and awake, not in any acute distress. HEENT: anicteric sclera, pupils reactive to light and accommodation, EOMI CVS: S1-S2 clear, no murmur rubs or gallops Chest: Decreased breath sound at the bases, no wheezing, rales or rhonchi Abdomen: Right upper quadrant perc drain + Extremities: no cyanosis, clubbing or edema noted bilaterally   Lab Results: Basic Metabolic Panel:  Lab 12/03/11 8413 12/02/11 1300  NA 131* 134*  K 3.9 4.7  CL 101 99  CO2 22 24  GLUCOSE 112* 118*  BUN 15 18  CREATININE 0.97 1.05  CALCIUM 10.5 11.5*  MG 1.6 --  PHOS 1.8* --   Liver Function Tests:  Lab 12/03/11 0420 12/02/11 1300  AST 25 23  ALT 10 10  ALKPHOS 62 60  BILITOT 0.9 1.3*  PROT 5.6* 6.6  ALBUMIN 2.9* 3.6    Lab 12/02/11 1300  LIPASE 13  AMYLASE --   No results found for this basename: AMMONIA:2 in the last 168 hours CBC:  Lab 12/03/11 0420 12/02/11 1300  WBC 17.3* 20.9*  NEUTROABS -- 18.5*  HGB 12.0* 13.7  HCT 35.6* 40.2  MCV 92.0 92.0  PLT 196 206   Cardiac Enzymes:  Lab 12/02/11  1255  CKTOTAL --  CKMB --  CKMBINDEX --  TROPONINI <0.30     Micro Results: Recent Results (from the past 240 hour(s))  MRSA PCR SCREENING     Status: Normal   Collection Time   12/02/11 11:36 PM      Component Value Range Status Comment   MRSA by PCR NEGATIVE  NEGATIVE  Final     Studies/Results: US Abdomen Complete  12/02/2011  *RADIOLOGY REPORT*  Clinical Data:  Right upper quadrant pain and fever  ABDOMINAL ULTRASOUND COMPLETE  Comparison:  None.   Findings:  Gallbladder:   There are multiple small gallstones and tumefactive sludge within the gallbladder lumen.  There is gallbladder wall thickening and pericholecystic fluid.  There is dirty shadowing from a portion of the gallbladder wall which is worrisome for gas.  Common Bile Duct:  Within normal limits in caliber.  Measures 4 mm.  Liver: No focal mass lesion identified.  Within normal limits in parenchymal echogenicity.  IVC:  Appears normal.  Pancreas:  No abnormality identified.  Spleen:  Within normal limits in size and echotexture.  Right kidney:  Normal in size and parenchymal echogenicity.  No evidence of mass or hydronephrosis.  Left kidney:  Normal in size and parenchymal echogenicity.  No evidence of mass or hydronephrosis.  Abdominal Aorta:  No aneurysm identified.  IMPRESSION: Cholelithiasis with cholecystitis.  Gas within a portion of the gallbladder wall is also suspected, consistent with emphysematous cholecystitis. Discussed with Dr. Manus Gunning.  Original Report Authenticated By: Brandon Melnick, M.D.   Ct Abdomen Pelvis W Contrast  12/02/2011  *RADIOLOGY REPORT*  Clinical Data: Short of breath.  Pain all over.  History of prostate cancer.  CT ABDOMEN AND PELVIS WITH CONTRAST  Technique:  Multidetector CT imaging of the abdomen and pelvis was performed following the standard protocol during bolus administration of intravenous contrast.  Contrast: OMNIPAQUE IOHEXOL 300 MG/ML  SOLN  Comparison: 12/02/2011 abdominal ultrasound.  Findings: Lung Bases: There is a mass in the inferior left chest abutting the pericardium.  Fat plane between the mass and the left ventricle is effaced.  Severe coronary artery atherosclerosis is present.  The dependent atelectasis is present in the right lung.  Liver:  Tiny left hepatic lobe low density lesions probably representing cysts or hemangioma.  No mass lesion.  Small diaphragmatic lymph node incidentally noted (image 20 series 2).  Spleen:  Normal.   Gallbladder:  Inflamed.  Dependently layering high-density material most compatible with cholelithiasis.  Pericholecystic fluid and fat stranding.  Likely reactive mural thickening of the hepatic flexure of the colon.  Common bile duct:  No intra or extrahepatic biliary ductal dilation.  No common duct stone.  Pancreas:  Normal.  Adrenal glands:  Mild thickening of the left adrenal gland, likely representing hyperplasia.  Kidneys:  Nonobstructing bilateral renal collecting system calculi. Normal renal enhancement.  Largest cluster of calculi in the right inferior renal pole measuring 10 mm x 9 mm.  13 mm left interpolar simple renal cyst.  Retroaortic left renal vein incidentally noted.  Stomach:  Within normal limits.  Small bowel:  No small bowel obstruction. Penile prosthesis reservoir in the right lower quadrant.  Colon:   Diverticulosis.  Inflammatory changes of the hepatic flexure.  Pelvic Genitourinary:  Urinary bladder appears within normal limits.  Surgical clips in the anatomic pelvis compatible with prostatectomy.  Tubing is present in the right inguinal region compatible with a prosthesis.  Bones:  Lumbar spondylosis and scoliosis.  Degenerative endplate changes.  Lower lumbar facet arthrosis.  No aggressive osseous lesions are identified.  There is no retroperitoneal adenopathy.  No inguinal adenopathy.  Vasculature: There are right iliac atherosclerosis without aneurysm.  No acute vascular abnormality.  IMPRESSION: 1.  Acute cholecystitis with inflammatory changes extending to the hepatic flexure of the colon.  Cholelithiasis.  2.  Nephrolithiasis.  No hydronephrosis.  Renal cyst.  3.  Atherosclerosis and coronary artery disease.  4.  9.5 cm x 6.8 cm mass is partially visualized in the inferior chest.  This would be atypical presentation for metastatic prostate cancer in the absence of bone lesions or retroperitoneal adenopathy.  Primary bronchogenic carcinoma is the top consideration although the  appearance is nonspecific.  Original Report Authenticated By: Andreas Newport, M.D.   Dg Chest Portable 1 View  12/02/2011  *RADIOLOGY REPORT*  Clinical Data: Shortness of breath.  PORTABLE CHEST - 1 VIEW 12/02/2011 1324 hours:  Comparison: None.  Findings: Cardiac silhouette mildly enlarged even allowing for the AP portable technique, with mildly prominent central pulmonary arteries.  Thoracic aorta atherosclerotic.  Elevation of the right hemidiaphragm.  Linear opacities in both lung bases.  Lungs otherwise clear.  Pulmonary vascularity normal.  No visible pleural effusions.  IMPRESSION: Mild cardiomegaly.  Linear scar or atelectasis in the lung bases. No acute cardiopulmonary disease otherwise.  Original Report Authenticated By: Arnell Sieving, M.D.    Medications: Scheduled Meds:    . sodium chloride   Intravenous Once  . antiseptic oral rinse  15 mL Mouth Rinse BID  . pantoprazole (PROTONIX) IV  40 mg Intravenous QHS  . piperacillin-tazobactam (ZOSYN)  IV  3.375 g Intravenous Once  . piperacillin-tazobactam (ZOSYN)  IV  3.375 g Intravenous Q8H   Continuous Infusions:    . dextrose 5 % and 0.45 % NaCl with KCl 20 mEq/L 100 mL (12/03/11 0819)     Assessment/Plan: Principal Problem:  *Cholecystitis, acute - Status post percutaneous cholecystostomy done today by IR, on n.p.o. status, IV fluids, Zosyn. - Management per general surgery and IR, advance diet per surgery recommendations  Active Problems:  Lung mass: New diagnosis - I discussed in detail with the patient (who does appear to be somewhat confused) and patient's daughter, Hurley Cisco [facility]) on the phone who was not aware of known lung mass. She did relate to me that patient had a history of prostrate cancer 17 years ago, patient had followed urology at [facility]  and had completed the treatment  - Per surgery, Dr. Andrey Campanile, he had consulted CT surgery, Dr. Laneta Simmers today, patient will likely need biopsy for further workup. Given  the mass is also abutting the pericardium and the patient had dyspnea on presentation with severe coronary artery atherosclerosis on the CT, I will also order 2-D echo for further workup. - I ordered a PSA and CEA, CT chest has been ordered as well. Pending biopsy results, will consult oncology.    History of Prostate cancer: - Obtain PSA,  No bony metastasis on the CT abdomen pelvis, follow CT chest   Dementia: Stable  DVT Prophylaxis: SCDs  Code Status: Full code   LOS: 1 day   Keyana Guevara M.D. [facility] 12/03/2011, 2:58 PM Pager: [phone]

## 2011-12-03 NOTE — Procedures (Signed)
Successful RUQ transhepatic cholecystostomy No comp Sample for GS CX Full report in PACS

## 2011-12-03 NOTE — Progress Notes (Signed)
Subjective: No major issues overnight. C/o of some abd pain  Objective: Vital signs in last 24 hours: Temp:  [97.5 F (36.4 C)-101.1 F (38.4 C)] 97.5 F (36.4 C) (05/10 0351) Pulse Rate:  [83-102] 91  (05/10 0351) Resp:  [16-22] 20  (05/10 0351) BP: (101-130)/(59-75) 116/61 mmHg (05/10 0351) SpO2:  [92 %-97 %] 94 % (05/10 0351) Weight:  [173 lb 8 oz (78.7 kg)] 173 lb 8 oz (78.7 kg) (05/09 2010) Last BM Date: 12/02/11  Intake/Output from previous day: 05/09 0701 - 05/10 0700 In: 1000 [I.V.:1000] Out: 525 [Urine:525] Intake/Output this shift:    Alert, nad. Appriopate. Takes a little while to answer questions.  cta Reg Soft, nd, +RUQ TTP.  Lab Results:   Basename 12/03/11 0420 12/02/11 1300  WBC 17.3* 20.9*  HGB 12.0* 13.7  HCT 35.6* 40.2  PLT 196 206   BMET  Basename 12/03/11 0420 12/02/11 1300  NA 131* 134*  K 3.9 4.7  CL 101 99  CO2 22 24  GLUCOSE 112* 118*  BUN 15 18  CREATININE 0.97 1.05  CALCIUM 10.5 11.5*   PT/INR  Basename 12/02/11 1300  LABPROT 15.0  INR 1.16   ABG No results found for this basename: PHART:2,PCO2:2,PO2:2,HCO3:2 in the last 72 hours  Studies/Results: US Abdomen Complete  12/02/2011  *RADIOLOGY REPORT*  Clinical Data:  Right upper quadrant pain and fever  ABDOMINAL ULTRASOUND COMPLETE  Comparison:  None.  Findings:  Gallbladder:   There are multiple small gallstones and tumefactive sludge within the gallbladder lumen.  There is gallbladder wall thickening and pericholecystic fluid.  There is dirty shadowing from a portion of the gallbladder wall which is worrisome for gas.  Common Bile Duct:  Within normal limits in caliber.  Measures 4 mm.  Liver: No focal mass lesion identified.  Within normal limits in parenchymal echogenicity.  IVC:  Appears normal.  Pancreas:  No abnormality identified.  Spleen:  Within normal limits in size and echotexture.  Right kidney:  Normal in size and parenchymal echogenicity.  No evidence of mass or  hydronephrosis.  Left kidney:  Normal in size and parenchymal echogenicity.  No evidence of mass or hydronephrosis.  Abdominal Aorta:  No aneurysm identified.  IMPRESSION: Cholelithiasis with cholecystitis.  Gas within a portion of the gallbladder wall is also suspected, consistent with emphysematous cholecystitis. Discussed with Dr. Manus Gunning.  Original Report Authenticated By: Brandon Melnick, M.D.   Ct Abdomen Pelvis W Contrast  12/02/2011  *RADIOLOGY REPORT*  Clinical Data: Short of breath.  Pain all over.  History of prostate cancer.  CT ABDOMEN AND PELVIS WITH CONTRAST  Technique:  Multidetector CT imaging of the abdomen and pelvis was performed following the standard protocol during bolus administration of intravenous contrast.  Contrast: OMNIPAQUE IOHEXOL 300 MG/ML  SOLN  Comparison: 12/02/2011 abdominal ultrasound.  Findings: Lung Bases: There is a mass in the inferior left chest abutting the pericardium.  Fat plane between the mass and the left ventricle is effaced.  Severe coronary artery atherosclerosis is present.  The dependent atelectasis is present in the right lung.  Liver:  Tiny left hepatic lobe low density lesions probably representing cysts or hemangioma.  No mass lesion.  Small diaphragmatic lymph node incidentally noted (image 20 series 2).  Spleen:  Normal.  Gallbladder:  Inflamed.  Dependently layering high-density material most compatible with cholelithiasis.  Pericholecystic fluid and fat stranding.  Likely reactive mural thickening of the hepatic flexure of the colon.  Common bile duct:  No intra or extrahepatic biliary ductal dilation.  No common duct stone.  Pancreas:  Normal.  Adrenal glands:  Mild thickening of the left adrenal gland, likely representing hyperplasia.  Kidneys:  Nonobstructing bilateral renal collecting system calculi. Normal renal enhancement.  Largest cluster of calculi in the right inferior renal pole measuring 10 mm x 9 mm.  13 mm left interpolar simple renal  cyst.  Retroaortic left renal vein incidentally noted.  Stomach:  Within normal limits.  Small bowel:  No small bowel obstruction. Penile prosthesis reservoir in the right lower quadrant.  Colon:   Diverticulosis.  Inflammatory changes of the hepatic flexure.  Pelvic Genitourinary:  Urinary bladder appears within normal limits.  Surgical clips in the anatomic pelvis compatible with prostatectomy.  Tubing is present in the right inguinal region compatible with a prosthesis.  Bones:  Lumbar spondylosis and scoliosis.  Degenerative endplate changes.  Lower lumbar facet arthrosis.  No aggressive osseous lesions are identified.  There is no retroperitoneal adenopathy.  No inguinal adenopathy.  Vasculature: There are right iliac atherosclerosis without aneurysm.  No acute vascular abnormality.  IMPRESSION: 1.  Acute cholecystitis with inflammatory changes extending to the hepatic flexure of the colon.  Cholelithiasis.  2.  Nephrolithiasis.  No hydronephrosis.  Renal cyst.  3.  Atherosclerosis and coronary artery disease.  4.  9.5 cm x 6.8 cm mass is partially visualized in the inferior chest.  This would be atypical presentation for metastatic prostate cancer in the absence of bone lesions or retroperitoneal adenopathy.  Primary bronchogenic carcinoma is the top consideration although the appearance is nonspecific.  Original Report Authenticated By: Andreas Newport, M.D.   Dg Chest Portable 1 View  12/02/2011  *RADIOLOGY REPORT*  Clinical Data: Shortness of breath.  PORTABLE CHEST - 1 VIEW 12/02/2011 1324 hours:  Comparison: None.  Findings: Cardiac silhouette mildly enlarged even allowing for the AP portable technique, with mildly prominent central pulmonary arteries.  Thoracic aorta atherosclerotic.  Elevation of the right hemidiaphragm.  Linear opacities in both lung bases.  Lungs otherwise clear.  Pulmonary vascularity normal.  No visible pleural effusions.  IMPRESSION: Mild cardiomegaly.  Linear scar or atelectasis  in the lung bases. No acute cardiopulmonary disease otherwise.  Original Report Authenticated By: Arnell Sieving, M.D.    Anti-infectives: Anti-infectives     Start     Dose/Rate Route Frequency Ordered Stop   12/02/11 2200   piperacillin-tazobactam (ZOSYN) IVPB 3.375 g        3.375 g 12.5 mL/hr over 240 Minutes Intravenous 3 times per day 12/02/11 2036     12/02/11 1545   piperacillin-tazobactam (ZOSYN) IVPB 3.375 g        3.375 g 12.5 mL/hr over 240 Minutes Intravenous  Once 12/02/11 1543 12/02/11 2102          Assessment/Plan: Acute cholecystitis - iv abx; given new lung mass, will place cholecystostomy tube to work up lung mass L Lung mass - thoracic surgery consult. Will ask what type of ct they want  Spoke with daughter on phone & informed her lung mass and new plan.   Jose Crawford. Andrey Campanile, MD, FACS General, Bariatric, & Minimally Invasive Surgery [facility], [place]   LOS: 1 day    Jose Crawford 12/03/2011

## 2011-12-03 NOTE — Progress Notes (Signed)
Utilization review completed.  

## 2011-12-03 NOTE — Interval H&P Note (Cosign Needed)
History and Physical Interval Note:  12/03/2011 10:57 AM  Jose Crawford  Is scheduled for a percutaneous cholecystostomy today secondary to cholecystitis. The various methods of treatment have been discussed with the pt's daughter, Hurley Cisco (pt confused). After consideration of risks, benefits and other options for treatment, the patient's daughter has consented to the above procedure for her father.  The patients' history has been reviewed, patient examined, no change in status, stable for the above procedure.Chest- CTA ant., heart-RRR, abd.- soft, few BS, + RUQ tenderness to palpation.  I have reviewed the patients' chart and labs.  Questions were answered to the patient's satisfaction.   Past Medical History  Diagnosis Date  . Basal cell carcinoma   . Prostate cancer    Past Surgical History  Procedure Date  . Prostatectomy    US Abdomen Complete  12/02/2011  *RADIOLOGY REPORT*  Clinical Data:  Right upper quadrant pain and fever  ABDOMINAL ULTRASOUND COMPLETE  Comparison:  None.  Findings:  Gallbladder:   There are multiple small gallstones and tumefactive sludge within the gallbladder lumen.  There is gallbladder wall thickening and pericholecystic fluid.  There is dirty shadowing from a portion of the gallbladder wall which is worrisome for gas.  Common Bile Duct:  Within normal limits in caliber.  Measures 4 mm.  Liver: No focal mass lesion identified.  Within normal limits in parenchymal echogenicity.  IVC:  Appears normal.  Pancreas:  No abnormality identified.  Spleen:  Within normal limits in size and echotexture.  Right kidney:  Normal in size and parenchymal echogenicity.  No evidence of mass or hydronephrosis.  Left kidney:  Normal in size and parenchymal echogenicity.  No evidence of mass or hydronephrosis.  Abdominal Aorta:  No aneurysm identified.  IMPRESSION: Cholelithiasis with cholecystitis.  Gas within a portion of the gallbladder wall is also suspected, consistent with  emphysematous cholecystitis. Discussed with Dr. Manus Gunning.  Original Report Authenticated By: Brandon Melnick, M.D.   Ct Abdomen Pelvis W Contrast  12/02/2011  *RADIOLOGY REPORT*  Clinical Data: Short of breath.  Pain all over.  History of prostate cancer.  CT ABDOMEN AND PELVIS WITH CONTRAST  Technique:  Multidetector CT imaging of the abdomen and pelvis was performed following the standard protocol during bolus administration of intravenous contrast.  Contrast: OMNIPAQUE IOHEXOL 300 MG/ML  SOLN  Comparison: 12/02/2011 abdominal ultrasound.  Findings: Lung Bases: There is a mass in the inferior left chest abutting the pericardium.  Fat plane between the mass and the left ventricle is effaced.  Severe coronary artery atherosclerosis is present.  The dependent atelectasis is present in the right lung.  Liver:  Tiny left hepatic lobe low density lesions probably representing cysts or hemangioma.  No mass lesion.  Small diaphragmatic lymph node incidentally noted (image 20 series 2).  Spleen:  Normal.  Gallbladder:  Inflamed.  Dependently layering high-density material most compatible with cholelithiasis.  Pericholecystic fluid and fat stranding.  Likely reactive mural thickening of the hepatic flexure of the colon.  Common bile duct:  No intra or extrahepatic biliary ductal dilation.  No common duct stone.  Pancreas:  Normal.  Adrenal glands:  Mild thickening of the left adrenal gland, likely representing hyperplasia.  Kidneys:  Nonobstructing bilateral renal collecting system calculi. Normal renal enhancement.  Largest cluster of calculi in the right inferior renal pole measuring 10 mm x 9 mm.  13 mm left interpolar simple renal cyst.  Retroaortic left renal vein incidentally noted.  Stomach:  Within  normal limits.  Small bowel:  No small bowel obstruction. Penile prosthesis reservoir in the right lower quadrant.  Colon:   Diverticulosis.  Inflammatory changes of the hepatic flexure.  Pelvic Genitourinary:   Urinary bladder appears within normal limits.  Surgical clips in the anatomic pelvis compatible with prostatectomy.  Tubing is present in the right inguinal region compatible with a prosthesis.  Bones:  Lumbar spondylosis and scoliosis.  Degenerative endplate changes.  Lower lumbar facet arthrosis.  No aggressive osseous lesions are identified.  There is no retroperitoneal adenopathy.  No inguinal adenopathy.  Vasculature: There are right iliac atherosclerosis without aneurysm.  No acute vascular abnormality.  IMPRESSION: 1.  Acute cholecystitis with inflammatory changes extending to the hepatic flexure of the colon.  Cholelithiasis.  2.  Nephrolithiasis.  No hydronephrosis.  Renal cyst.  3.  Atherosclerosis and coronary artery disease.  4.  9.5 cm x 6.8 cm mass is partially visualized in the inferior chest.  This would be atypical presentation for metastatic prostate cancer in the absence of bone lesions or retroperitoneal adenopathy.  Primary bronchogenic carcinoma is the top consideration although the appearance is nonspecific.  Original Report Authenticated By: Andreas Newport, M.D.   Dg Chest Portable 1 View  12/02/2011  *RADIOLOGY REPORT*  Clinical Data: Shortness of breath.  PORTABLE CHEST - 1 VIEW 12/02/2011 1324 hours:  Comparison: None.  Findings: Cardiac silhouette mildly enlarged even allowing for the AP portable technique, with mildly prominent central pulmonary arteries.  Thoracic aorta atherosclerotic.  Elevation of the right hemidiaphragm.  Linear opacities in both lung bases.  Lungs otherwise clear.  Pulmonary vascularity normal.  No visible pleural effusions.  IMPRESSION: Mild cardiomegaly.  Linear scar or atelectasis in the lung bases. No acute cardiopulmonary disease otherwise.  Original Report Authenticated By: Arnell Sieving, M.D.  Results for orders placed during the hospital encounter of 12/02/11  TROPONIN I      Component Value Range   Troponin I <0.30  <0.30 (ng/mL)  CBC       Component Value Range   WBC 20.9 (*) 4.0 - 10.5 (K/uL)   RBC 4.37  4.22 - 5.81 (MIL/uL)   Hemoglobin 13.7  13.0 - 17.0 (g/dL)   HCT 91.4  78.2 - 95.6 (%)   MCV 92.0  78.0 - 100.0 (fL)   MCH 31.4  26.0 - 34.0 (pg)   MCHC 34.1  30.0 - 36.0 (g/dL)   RDW 21.3  08.6 - 57.8 (%)   Platelets 206  150 - 400 (K/uL)  DIFFERENTIAL      Component Value Range   Neutrophils Relative 89 (*) 43 - 77 (%)   Neutro Abs 18.5 (*) 1.7 - 7.7 (K/uL)   Lymphocytes Relative 5 (*) 12 - 46 (%)   Lymphs Abs 1.0  0.7 - 4.0 (K/uL)   Monocytes Relative 7  3 - 12 (%)   Monocytes Absolute 1.4 (*) 0.1 - 1.0 (K/uL)   Eosinophils Relative 0  0 - 5 (%)   Eosinophils Absolute 0.0  0.0 - 0.7 (K/uL)   Basophils Relative 0  0 - 1 (%)   Basophils Absolute 0.0  0.0 - 0.1 (K/uL)  COMPREHENSIVE METABOLIC PANEL      Component Value Range   Sodium 134 (*) 135 - 145 (mEq/L)   Potassium 4.7  3.5 - 5.1 (mEq/L)   Chloride 99  96 - 112 (mEq/L)   CO2 24  19 - 32 (mEq/L)   Glucose, Bld 118 (*) 70 - 99 (mg/dL)  BUN 18  6 - 23 (mg/dL)   Creatinine, Ser 1.61  0.50 - 1.35 (mg/dL)   Calcium 09.6 (*) 8.4 - 10.5 (mg/dL)   Total Protein 6.6  6.0 - 8.3 (g/dL)   Albumin 3.6  3.5 - 5.2 (g/dL)   AST 23  0 - 37 (U/L)   ALT 10  0 - 53 (U/L)   Alkaline Phosphatase 60  39 - 117 (U/L)   Total Bilirubin 1.3 (*) 0.3 - 1.2 (mg/dL)   GFR calc non Af Amer 63 (*) >90 (mL/min)   GFR calc Af Amer 73 (*) >90 (mL/min)  URINALYSIS, ROUTINE W REFLEX MICROSCOPIC      Component Value Range   Color, Urine AMBER (*) YELLOW    APPearance HAZY (*) CLEAR    Specific Gravity, Urine 1.019  1.005 - 1.030    pH 6.0  5.0 - 8.0    Glucose, UA NEGATIVE  NEGATIVE (mg/dL)   Hgb urine dipstick LARGE (*) NEGATIVE    Bilirubin Urine NEGATIVE  NEGATIVE    Ketones, ur 15 (*) NEGATIVE (mg/dL)   Protein, ur 30 (*) NEGATIVE (mg/dL)   Urobilinogen, UA 1.0  0.0 - 1.0 (mg/dL)   Nitrite NEGATIVE  NEGATIVE    Leukocytes, UA NEGATIVE  NEGATIVE   LACTIC ACID, PLASMA       Component Value Range   Lactic Acid, Venous 1.5  0.5 - 2.2 (mmol/L)  PROTIME-INR      Component Value Range   Prothrombin Time 15.0  11.6 - 15.2 (seconds)   INR 1.16  0.00 - 1.49   LIPASE, BLOOD      Component Value Range   Lipase 13  11 - 59 (U/L)  URINE MICROSCOPIC-ADD ON      Component Value Range   Squamous Epithelial / LPF RARE  RARE    WBC, UA 0-2  <3 (WBC/hpf)   RBC / HPF 7-10  <3 (RBC/hpf)   Bacteria, UA RARE  RARE    Casts HYALINE CASTS (*) NEGATIVE    Urine-Other MUCOUS PRESENT    COMPREHENSIVE METABOLIC PANEL      Component Value Range   Sodium 131 (*) 135 - 145 (mEq/L)   Potassium 3.9  3.5 - 5.1 (mEq/L)   Chloride 101  96 - 112 (mEq/L)   CO2 22  19 - 32 (mEq/L)   Glucose, Bld 112 (*) 70 - 99 (mg/dL)   BUN 15  6 - 23 (mg/dL)   Creatinine, Ser 0.45  0.50 - 1.35 (mg/dL)   Calcium 40.9  8.4 - 10.5 (mg/dL)   Total Protein 5.6 (*) 6.0 - 8.3 (g/dL)   Albumin 2.9 (*) 3.5 - 5.2 (g/dL)   AST 25  0 - 37 (U/L)   ALT 10  0 - 53 (U/L)   Alkaline Phosphatase 62  39 - 117 (U/L)   Total Bilirubin 0.9  0.3 - 1.2 (mg/dL)   GFR calc non Af Amer 74 (*) >90 (mL/min)   GFR calc Af Amer 85 (*) >90 (mL/min)  MAGNESIUM      Component Value Range   Magnesium 1.6  1.5 - 2.5 (mg/dL)  PHOSPHORUS      Component Value Range   Phosphorus 1.8 (*) 2.3 - 4.6 (mg/dL)  CBC      Component Value Range   WBC 17.3 (*) 4.0 - 10.5 (K/uL)   RBC 3.87 (*) 4.22 - 5.81 (MIL/uL)   Hemoglobin 12.0 (*) 13.0 - 17.0 (g/dL)   HCT 81.1 (*) 91.4 -  52.0 (%)   MCV 92.0  78.0 - 100.0 (fL)   MCH 31.0  26.0 - 34.0 (pg)   MCHC 33.7  30.0 - 36.0 (g/dL)   RDW 62.1  30.8 - 65.7 (%)   Platelets 196  150 - 400 (K/uL)  MRSA PCR SCREENING      Component Value Range   MRSA by PCR NEGATIVE  NEGATIVE      Gabrielle Mester,D [facility]

## 2011-12-04 DIAGNOSIS — R222 Localized swelling, mass and lump, trunk: Secondary | ICD-10-CM

## 2011-12-04 DIAGNOSIS — K810 Acute cholecystitis: Secondary | ICD-10-CM

## 2011-12-04 DIAGNOSIS — A413 Sepsis due to Hemophilus influenzae: Secondary | ICD-10-CM

## 2011-12-04 DIAGNOSIS — N200 Calculus of kidney: Secondary | ICD-10-CM | POA: Diagnosis present

## 2011-12-04 DIAGNOSIS — C61 Malignant neoplasm of prostate: Secondary | ICD-10-CM

## 2011-12-04 DIAGNOSIS — D381 Neoplasm of uncertain behavior of trachea, bronchus and lung: Secondary | ICD-10-CM

## 2011-12-04 LAB — CBC
HCT: 34.3 % — ABNORMAL LOW (ref 39.0–52.0)
MCH: 31 pg (ref 26.0–34.0)
MCHC: 33.5 g/dL (ref 30.0–36.0)
MCV: 92.5 fL (ref 78.0–100.0)
Platelets: 195 10*3/uL (ref 150–400)
RDW: 13.9 % (ref 11.5–15.5)
WBC: 10.9 10*3/uL — ABNORMAL HIGH (ref 4.0–10.5)

## 2011-12-04 LAB — COMPREHENSIVE METABOLIC PANEL
ALT: 13 U/L (ref 0–53)
Albumin: 2.7 g/dL — ABNORMAL LOW (ref 3.5–5.2)
Alkaline Phosphatase: 85 U/L (ref 39–117)
BUN: 11 mg/dL (ref 6–23)
Chloride: 103 mEq/L (ref 96–112)
Potassium: 4 mEq/L (ref 3.5–5.1)
Sodium: 133 mEq/L — ABNORMAL LOW (ref 135–145)
Total Bilirubin: 0.8 mg/dL (ref 0.3–1.2)
Total Protein: 5.6 g/dL — ABNORMAL LOW (ref 6.0–8.3)

## 2011-12-04 LAB — DIFFERENTIAL
Basophils Absolute: 0 10*3/uL (ref 0.0–0.1)
Basophils Relative: 0 % (ref 0–1)
Eosinophils Absolute: 0.3 10*3/uL (ref 0.0–0.7)
Eosinophils Relative: 3 % (ref 0–5)
Lymphocytes Relative: 10 % — ABNORMAL LOW (ref 12–46)
Monocytes Absolute: 0.8 10*3/uL (ref 0.1–1.0)

## 2011-12-04 LAB — PSA: PSA: <0.01 ng/mL — ABNORMAL LOW (ref <=4.00)

## 2011-12-04 NOTE — Progress Notes (Signed)
  Echocardiogram 2D Echocardiogram has been performed.  Cathie Beams Deneen 12/04/2011, 9:39 AM

## 2011-12-04 NOTE — Progress Notes (Signed)
[facility]  Interim history: 78 y/o presenting with abd pain and found to have acute cholecystitis and admitted to surgery. Also, found to have a large mass in left chest. Therefore, a cholecystectomy was not done. A cholecystostomy tube was placed by IR on 5/10. He was transferred over to the hospitalist service on 5/10.   Subjective: Abd pain is much improved. Wants to eat. Anxious to know what the lung mass is.   Objective: Blood pressure 129/69, pulse 73, temperature 98 F (36.7 C), temperature source Oral, resp. rate 18, height 6\' 1"  (1.854 m), weight 78.7 kg (173 lb 8 oz), SpO2 97.00%. Weight change:   Intake/Output Summary (Last 24 hours) at 12/04/11 1612 Last data filed at 12/04/11 1453  Gross per 24 hour  Intake 3476.25 ml  Output   2456 ml  Net 1020.25 ml    Physical Exam: General appearance: alert, cooperative and no distress Lungs: clear to auscultation bilaterally and decreased breath sounds in left lower chest Heart: regular rate and rhythm, S1, S2 normal, no murmur, click, rub or gallop Abdomen: chole tube draining bilious fluid, mild tenderness in RUQ, bowel sounds postitive Extremities: extremities normal, atraumatic, no cyanosis or edema  Lab Results:  [facility] 12/04/11 0422 12/03/11 0420  NA 133* 131*  K 4.0 3.9  CL 103 101  CO2 23 22  GLUCOSE 109* 112*  BUN 11 15  CREATININE 1.01 0.97  CALCIUM 10.7* 10.5  MG -- 1.6  PHOS -- 1.8*    Basename 12/04/11 0422 12/03/11 0420  AST 22 25  ALT 13 10  ALKPHOS 85 62  BILITOT 0.8 0.9  PROT 5.6* 5.6*  ALBUMIN 2.7* 2.9*    Basename 12/02/11 1300  LIPASE 13  AMYLASE --    Basename 12/04/11 0422 12/03/11 0420 12/02/11 1300  WBC 10.9* 17.3* --  NEUTROABS 8.7* -- 18.5*  HGB 11.5* 12.0* --  HCT 34.3* 35.6* --  MCV 92.5 92.0 --  PLT 195 196 --    Basename 12/02/11 1255  CKTOTAL --  CKMB --  CKMBINDEX --  TROPONINI <0.30   No components found with this basename: POCBNP:3 No results found  for this basename: DDIMER:2 in the last 72 hours No results found for this basename: HGBA1C:2 in the last 72 hours No results found for this basename: CHOL:2,HDL:2,LDLCALC:2,TRIG:2,CHOLHDL:2,LDLDIRECT:2 in the last 72 hours No results found for this basename: TSH,T4TOTAL,FREET3,T3FREE,THYROIDAB in the last 72 hours No results found for this basename: VITAMINB12:2,FOLATE:2,FERRITIN:2,TIBC:2,IRON:2,RETICCTPCT:2 in the last 72 hours  Micro Results: Recent Results (from the past 240 hour(s))  MRSA PCR SCREENING     Status: Normal   Collection Time   12/02/11 11:36 PM      Component Value Range Status Comment   MRSA by PCR NEGATIVE  NEGATIVE  Final   BODY FLUID CULTURE     Status: Normal (Preliminary result)   Collection Time   12/03/11  1:58 PM      Component Value Range Status Comment   Specimen Description FLUID GALL BLADDER   Final    Special Requests NONE   Final    Gram Stain     Final    Value: NO WBC SEEN     MODERATE GRAM POSITIVE COCCI IN PAIRS     IN CLUSTERS   Culture Culture reincubated for better growth   Final    Report Status PENDING   Incomplete     Studies/Results: Ct Chest W Contrast  12/03/2011  *RADIOLOGY REPORT*  Clinical Data: Chest mass.  CT CHEST WITH CONTRAST  Technique:  Multidetector CT imaging of the chest was performed following the standard protocol during bolus administration of intravenous contrast.  Contrast: 80mL OMNIPAQUE IOHEXOL 300 MG/ML  SOLN  Comparison: CT abdomen and pelvis 12/02/2011.  Findings: There is a large mass immediately adjacent to the left heart border measuring 6.2 cm transverse by 9.3 cm AP by 8.7 cm cranial-caudal. It is contiguous with the left ventricle but there appears to be a fat plane between the lesion and the pulmonary outflow tract and left ventricle.  The lesion appears to within the mediastinum and could be lymphoma or less likely thymoma. Bronchogenic carcinoma cannot be excluded.  The patient has a very small right pleural  effusion and basilar airspace disease.  Heart size is normal.  Coronary and aortic atherosclerotic vascular disease is noted.  Dependent atelectasis is seen on the right.  Incidentally imaged upper abdomen shows partial visualization of a drain within the gallbladder.  No focal bony abnormality.  IMPRESSION: Large mass along the left heart border appears to be within the mediastinum and could be due to lymphoma or less likely thymoma. The lesion could be within lung and due to bronchogenic carcinoma.  Original Report Authenticated By: Bernadene Bell. Maricela Curet, M.D.   US Abdomen Complete  12/02/2011  *RADIOLOGY REPORT*  Clinical Data:  Right upper quadrant pain and fever  ABDOMINAL ULTRASOUND COMPLETE  Comparison:  None.  Findings:  Gallbladder:   There are multiple small gallstones and tumefactive sludge within the gallbladder lumen.  There is gallbladder wall thickening and pericholecystic fluid.  There is dirty shadowing from a portion of the gallbladder wall which is worrisome for gas.  Common Bile Duct:  Within normal limits in caliber.  Measures 4 mm.  Liver: No focal mass lesion identified.  Within normal limits in parenchymal echogenicity.  IVC:  Appears normal.  Pancreas:  No abnormality identified.  Spleen:  Within normal limits in size and echotexture.  Right kidney:  Normal in size and parenchymal echogenicity.  No evidence of mass or hydronephrosis.  Left kidney:  Normal in size and parenchymal echogenicity.  No evidence of mass or hydronephrosis.  Abdominal Aorta:  No aneurysm identified.  IMPRESSION: Cholelithiasis with cholecystitis.  Gas within a portion of the gallbladder wall is also suspected, consistent with emphysematous cholecystitis. Discussed with Dr. Manus Gunning.  Original Report Authenticated By: Brandon Melnick, M.D.   Ct Abdomen Pelvis W Contrast  12/02/2011  *RADIOLOGY REPORT*  Clinical Data: Short of breath.  Pain all over.  History of prostate cancer.  CT ABDOMEN AND PELVIS WITH CONTRAST   Technique:  Multidetector CT imaging of the abdomen and pelvis was performed following the standard protocol during bolus administration of intravenous contrast.  Contrast: OMNIPAQUE IOHEXOL 300 MG/ML  SOLN  Comparison: 12/02/2011 abdominal ultrasound.  Findings: Lung Bases: There is a mass in the inferior left chest abutting the pericardium.  Fat plane between the mass and the left ventricle is effaced.  Severe coronary artery atherosclerosis is present.  The dependent atelectasis is present in the right lung.  Liver:  Tiny left hepatic lobe low density lesions probably representing cysts or hemangioma.  No mass lesion.  Small diaphragmatic lymph node incidentally noted (image 20 series 2).  Spleen:  Normal.  Gallbladder:  Inflamed.  Dependently layering high-density material most compatible with cholelithiasis.  Pericholecystic fluid and fat stranding.  Likely reactive mural thickening of the hepatic flexure of the colon.  Common bile duct:  No intra  or extrahepatic biliary ductal dilation.  No common duct stone.  Pancreas:  Normal.  Adrenal glands:  Mild thickening of the left adrenal gland, likely representing hyperplasia.  Kidneys:  Nonobstructing bilateral renal collecting system calculi. Normal renal enhancement.  Largest cluster of calculi in the right inferior renal pole measuring 10 mm x 9 mm.  13 mm left interpolar simple renal cyst.  Retroaortic left renal vein incidentally noted.  Stomach:  Within normal limits.  Small bowel:  No small bowel obstruction. Penile prosthesis reservoir in the right lower quadrant.  Colon:   Diverticulosis.  Inflammatory changes of the hepatic flexure.  Pelvic Genitourinary:  Urinary bladder appears within normal limits.  Surgical clips in the anatomic pelvis compatible with prostatectomy.  Tubing is present in the right inguinal region compatible with a prosthesis.  Bones:  Lumbar spondylosis and scoliosis.  Degenerative endplate changes.  Lower lumbar facet  arthrosis.  No aggressive osseous lesions are identified.  There is no retroperitoneal adenopathy.  No inguinal adenopathy.  Vasculature: There are right iliac atherosclerosis without aneurysm.  No acute vascular abnormality.  IMPRESSION: 1.  Acute cholecystitis with inflammatory changes extending to the hepatic flexure of the colon.  Cholelithiasis.  2.  Nephrolithiasis.  No hydronephrosis.  Renal cyst.  3.  Atherosclerosis and coronary artery disease.  4.  9.5 cm x 6.8 cm mass is partially visualized in the inferior chest.  This would be atypical presentation for metastatic prostate cancer in the absence of bone lesions or retroperitoneal adenopathy.  Primary bronchogenic carcinoma is the top consideration although the appearance is nonspecific.  Original Report Authenticated By: Andreas Newport, M.D.   Ir Perc Cholecystostomy  12/03/2011  *RADIOLOGY REPORT*  Clinical Data: Acute cholecystitis, nonoperative candidate  ULTRASOUND FLUOROSCOPIC PERCUTANEOUS TRANSHEPATIC CHOLECYSTOSTOMY  Date:  12/03/2011 14:15:00  Radiologist:  M. Ruel Favors, M.D.  Medications:  The patient is already receiving daily antibiotics, 1% lidocaine locally.  Conscious sedation was not necessary.  Guidance:  Ultrasound fluoroscopic  Fluoroscopy time:  0.5 minutes  Sedation time:  None.  Contrast volume:  5 ml Omnipaque-300  Complications:  No immediate  PROCEDURE/FINDINGS:  Informed consent was obtained from the patient following explanation of the procedure, risks, benefits and alternatives. The patient understands, agrees and consents for the procedure. All questions were addressed.  A time out was performed.  Maximal barrier sterile technique utilized including caps, mask, sterile gowns, sterile gloves, large sterile drape, hand hygiene, and betadine  Previous CT reviewed.  Acute calculus cholecystitis noted. Preliminary ultrasound performed demonstrating a distended thick walled gallbladder containing gallstones.  Under sterile  conditions and local anesthesia, a percutaneous access needle was advanced from a right upper quadrant transhepatic approach into the gallbladder with ultrasound.  Images obtained for documentation. There was return of bile.  Guide wire advanced followed by the Accustick dilator.  This allowed exchange for an Amplatz guide wire.  10-French drain advanced over the Amplatz guide wire. Retention loop formed in the gallbladder.  Syringe aspiration yielded 80 ml bile.  Sample sent for Gram stain and culture. Contrast injection confirms position.  Filling defects in the gallbladder noted compatible with gallstones.  Catheter secured with a Prolene suture and connected to external drainage.  Sterile dressing applied.  No immediate complication.  The patient tolerated the procedure well.  IMPRESSION: Successful ultrasound and fluoroscopic transhepatic 10-French cholecystostomy.  Original Report Authenticated By: Judie Petit. Ruel Favors, M.D.   Dg Chest Portable 1 View  12/02/2011  *RADIOLOGY REPORT*  Clinical Data: Shortness of  breath.  PORTABLE CHEST - 1 VIEW 12/02/2011 1324 hours:  Comparison: None.  Findings: Cardiac silhouette mildly enlarged even allowing for the AP portable technique, with mildly prominent central pulmonary arteries.  Thoracic aorta atherosclerotic.  Elevation of the right hemidiaphragm.  Linear opacities in both lung bases.  Lungs otherwise clear.  Pulmonary vascularity normal.  No visible pleural effusions.  IMPRESSION: Mild cardiomegaly.  Linear scar or atelectasis in the lung bases. No acute cardiopulmonary disease otherwise.  Original Report Authenticated By: Arnell Sieving, M.D.    Medications: Scheduled Meds:   . antiseptic oral rinse  15 mL Mouth Rinse BID  . pantoprazole (PROTONIX) IV  40 mg Intravenous QHS  . piperacillin-tazobactam (ZOSYN)  IV  3.375 g Intravenous Q8H   Continuous Infusions:   . dextrose 5 % and 0.45 % NaCl with KCl 20 mEq/L 100 mL/hr at 12/04/11 1453   PRN  Meds:.diphenhydrAMINE, diphenhydrAMINE, iohexol, morphine injection, ondansetron  Assessment/Plan:  Acute calculous cholecystitis S/p cholecystostomy tube.    Lung mass Appreciate CVTS consult- biopsy by IR next week   Prostate cancer PSA has been checked and is quite low   Nephrolithiasis without hydronephrosis Noted on imaging  Dementia?   LOS: 2 days   [facility] [phone] 12/04/2011, 4:12 PM

## 2011-12-04 NOTE — Progress Notes (Signed)
[facility] Progress Note    Subjective: Patient has lots of questions.  Seems a bit confused about what is going on.  Objective: Vital signs in last 24 hours: Temp:  [97.4 F (36.3 C)-98.7 F (37.1 C)] 98 F (36.7 C) (05/11 1245) Pulse Rate:  [66-81] 73  (05/11 0800) Resp:  [14-18] 18  (05/11 0800) BP: (97-129)/(57-69) 129/69 mmHg (05/11 1245) SpO2:  [92 %-97 %] 97 % (05/11 0800) Last BM Date: 12/03/11  Intake/Output from previous day: 05/10 0701 - 05/11 0700 In: 2706.3 [I.V.:2656.3; IV Piggyback:50] Out: 2180 [Urine:1950; Drains:230] Intake/Output this shift: Total I/O In: 720 [P.O.:120; I.V.:600] Out: 575 [Urine:500; Drains:75]  General: No acute distress  Lungs: Clear  Abd: soft, nontender, good bowel sounds.  RUQ percutaneous drain in place.  Draining bile and some debris  Extremities: No complaints  Neuro: Intact  Lab Results:  @LABLAST2 (wbc:2,hgb:2,hct:2,plt:2) BMET  Basename 12/04/11 0422 12/03/11 0420  NA 133* 131*  K 4.0 3.9  CL 103 101  CO2 23 22  GLUCOSE 109* 112*  BUN 11 15  CREATININE 1.01 0.97  CALCIUM 10.7* 10.5   PT/INR  Basename 12/02/11 1300  LABPROT 15.0  INR 1.16   ABG No results found for this basename: PHART:2,PCO2:2,PO2:2,HCO3:2 in the last 72 hours  Studies/Results: Ct Chest W Contrast  12/03/2011  *RADIOLOGY REPORT*  Clinical Data: Chest mass.  CT CHEST WITH CONTRAST  Technique:  Multidetector CT imaging of the chest was performed following the standard protocol during bolus administration of intravenous contrast.  Contrast: 80mL OMNIPAQUE IOHEXOL 300 MG/ML  SOLN  Comparison: CT abdomen and pelvis 12/02/2011.  Findings: There is a large mass immediately adjacent to the left heart border measuring 6.2 cm transverse by 9.3 cm AP by 8.7 cm cranial-caudal. It is contiguous with the left ventricle but there appears to be a fat plane between the lesion and the pulmonary outflow tract and left ventricle.  The lesion appears to within the  mediastinum and could be lymphoma or less likely thymoma. Bronchogenic carcinoma cannot be excluded.  The patient has a very small right pleural effusion and basilar airspace disease.  Heart size is normal.  Coronary and aortic atherosclerotic vascular disease is noted.  Dependent atelectasis is seen on the right.  Incidentally imaged upper abdomen shows partial visualization of a drain within the gallbladder.  No focal bony abnormality.  IMPRESSION: Large mass along the left heart border appears to be within the mediastinum and could be due to lymphoma or less likely thymoma. The lesion could be within lung and due to bronchogenic carcinoma.  Original Report Authenticated By: Bernadene Bell. Maricela Curet, M.D.   US Abdomen Complete  12/02/2011  *RADIOLOGY REPORT*  Clinical Data:  Right upper quadrant pain and fever  ABDOMINAL ULTRASOUND COMPLETE  Comparison:  None.  Findings:  Gallbladder:   There are multiple small gallstones and tumefactive sludge within the gallbladder lumen.  There is gallbladder wall thickening and pericholecystic fluid.  There is dirty shadowing from a portion of the gallbladder wall which is worrisome for gas.  Common Bile Duct:  Within normal limits in caliber.  Measures 4 mm.  Liver: No focal mass lesion identified.  Within normal limits in parenchymal echogenicity.  IVC:  Appears normal.  Pancreas:  No abnormality identified.  Spleen:  Within normal limits in size and echotexture.  Right kidney:  Normal in size and parenchymal echogenicity.  No evidence of mass or hydronephrosis.  Left kidney:  Normal in size and parenchymal echogenicity.  No evidence  of mass or hydronephrosis.  Abdominal Aorta:  No aneurysm identified.  IMPRESSION: Cholelithiasis with cholecystitis.  Gas within a portion of the gallbladder wall is also suspected, consistent with emphysematous cholecystitis. Discussed with Dr. Manus Gunning.  Original Report Authenticated By: Brandon Melnick, M.D.   Ct Abdomen Pelvis W  Contrast  12/02/2011  *RADIOLOGY REPORT*  Clinical Data: Short of breath.  Pain all over.  History of prostate cancer.  CT ABDOMEN AND PELVIS WITH CONTRAST  Technique:  Multidetector CT imaging of the abdomen and pelvis was performed following the standard protocol during bolus administration of intravenous contrast.  Contrast: OMNIPAQUE IOHEXOL 300 MG/ML  SOLN  Comparison: 12/02/2011 abdominal ultrasound.  Findings: Lung Bases: There is a mass in the inferior left chest abutting the pericardium.  Fat plane between the mass and the left ventricle is effaced.  Severe coronary artery atherosclerosis is present.  The dependent atelectasis is present in the right lung.  Liver:  Tiny left hepatic lobe low density lesions probably representing cysts or hemangioma.  No mass lesion.  Small diaphragmatic lymph node incidentally noted (image 20 series 2).  Spleen:  Normal.  Gallbladder:  Inflamed.  Dependently layering high-density material most compatible with cholelithiasis.  Pericholecystic fluid and fat stranding.  Likely reactive mural thickening of the hepatic flexure of the colon.  Common bile duct:  No intra or extrahepatic biliary ductal dilation.  No common duct stone.  Pancreas:  Normal.  Adrenal glands:  Mild thickening of the left adrenal gland, likely representing hyperplasia.  Kidneys:  Nonobstructing bilateral renal collecting system calculi. Normal renal enhancement.  Largest cluster of calculi in the right inferior renal pole measuring 10 mm x 9 mm.  13 mm left interpolar simple renal cyst.  Retroaortic left renal vein incidentally noted.  Stomach:  Within normal limits.  Small bowel:  No small bowel obstruction. Penile prosthesis reservoir in the right lower quadrant.  Colon:   Diverticulosis.  Inflammatory changes of the hepatic flexure.  Pelvic Genitourinary:  Urinary bladder appears within normal limits.  Surgical clips in the anatomic pelvis compatible with prostatectomy.  Tubing is present in the  right inguinal region compatible with a prosthesis.  Bones:  Lumbar spondylosis and scoliosis.  Degenerative endplate changes.  Lower lumbar facet arthrosis.  No aggressive osseous lesions are identified.  There is no retroperitoneal adenopathy.  No inguinal adenopathy.  Vasculature: There are right iliac atherosclerosis without aneurysm.  No acute vascular abnormality.  IMPRESSION: 1.  Acute cholecystitis with inflammatory changes extending to the hepatic flexure of the colon.  Cholelithiasis.  2.  Nephrolithiasis.  No hydronephrosis.  Renal cyst.  3.  Atherosclerosis and coronary artery disease.  4.  9.5 cm x 6.8 cm mass is partially visualized in the inferior chest.  This would be atypical presentation for metastatic prostate cancer in the absence of bone lesions or retroperitoneal adenopathy.  Primary bronchogenic carcinoma is the top consideration although the appearance is nonspecific.  Original Report Authenticated By: Andreas Newport, M.D.   Ir Perc Cholecystostomy  12/03/2011  *RADIOLOGY REPORT*  Clinical Data: Acute cholecystitis, nonoperative candidate  ULTRASOUND FLUOROSCOPIC PERCUTANEOUS TRANSHEPATIC CHOLECYSTOSTOMY  Date:  12/03/2011 14:15:00  Radiologist:  M. Ruel Favors, M.D.  Medications:  The patient is already receiving daily antibiotics, 1% lidocaine locally.  Conscious sedation was not necessary.  Guidance:  Ultrasound fluoroscopic  Fluoroscopy time:  0.5 minutes  Sedation time:  None.  Contrast volume:  5 ml Omnipaque-300  Complications:  No immediate  PROCEDURE/FINDINGS:  Informed  consent was obtained from the patient following explanation of the procedure, risks, benefits and alternatives. The patient understands, agrees and consents for the procedure. All questions were addressed.  A time out was performed.  Maximal barrier sterile technique utilized including caps, mask, sterile gowns, sterile gloves, large sterile drape, hand hygiene, and betadine  Previous CT reviewed.  Acute calculus  cholecystitis noted. Preliminary ultrasound performed demonstrating a distended thick walled gallbladder containing gallstones.  Under sterile conditions and local anesthesia, a percutaneous access needle was advanced from a right upper quadrant transhepatic approach into the gallbladder with ultrasound.  Images obtained for documentation. There was return of bile.  Guide wire advanced followed by the Accustick dilator.  This allowed exchange for an Amplatz guide wire.  10-French drain advanced over the Amplatz guide wire. Retention loop formed in the gallbladder.  Syringe aspiration yielded 80 ml bile.  Sample sent for Gram stain and culture. Contrast injection confirms position.  Filling defects in the gallbladder noted compatible with gallstones.  Catheter secured with a Prolene suture and connected to external drainage.  Sterile dressing applied.  No immediate complication.  The patient tolerated the procedure well.  IMPRESSION: Successful ultrasound and fluoroscopic transhepatic 10-French cholecystostomy.  Original Report Authenticated By: Judie Petit. Ruel Favors, M.D.    Anti-infectives: Anti-infectives     Start     Dose/Rate Route Frequency Ordered Stop   12/02/11 2200  piperacillin-tazobactam (ZOSYN) IVPB 3.375 g       3.375 g 12.5 mL/hr over 240 Minutes Intravenous 3 times per day 12/02/11 2036     12/02/11 1545  piperacillin-tazobactam (ZOSYN) IVPB 3.375 g       3.375 g 12.5 mL/hr over 240 Minutes Intravenous  Once 12/02/11 1543 12/02/11 2102          Assessment/Plan: s/p  Advance diet Continue ABX therapy due to Post-op infection The patient will need his drain in place for at least 6 weeks prior to consider ation of Cholecystectomy.  Will need outpatient follow-up with Dr. Gaynelle Adu.  Diet as tolerated.  LOS: 2 days   Marta Lamas. Gae Bon, MD, FACS [phone] [phone] [facility] 12/04/2011

## 2011-12-04 NOTE — Consult Note (Signed)
[address]            [place] [address]          [phone]      Reason for Consult: Left chest mass Referring Physician: Gaynelle Adu, MD  Jose Crawford is an 78 y.o. male.  HPI:  The patient is an 78 year old gentleman who was admitted with nausea, vomiting, abdominal pain and diagnosed with acute cholecystitis. His initial chest x-ray was not read as showing a lung mass but when he had an abdominal CT scan performed the uppermost cuts clearly showed a large mass adjacent to the left ventricle. His acute cholecystitis was treated with a percutaneous cholecystostomy tube with quick resolution of his leukocytosis. A complete chest CT scan showed a 6.2 x 9.3 x 8.7 cm mass adjacent to the left heart border. This looked like it ma be within the mediastinum but is not possible to completely rule out that it is a bronchogenic carcinoma.  Past Medical History  Diagnosis Date  . Basal cell carcinoma   . Prostate cancer     Past Surgical History  Procedure Date  . Prostatectomy     History reviewed. No pertinent family history.  Social History:  reports that he was a remote smoker but quit in the 1950's. He does not have any smokeless tobacco history on file. He reports that he uses illicit drugs. He reports that he does not drink alcohol.  Allergies: No Known Allergies  Medications:  I have reviewed the patient's current medications. Prior to Admission:  Prescriptions prior to admission  Medication Sig Dispense Refill  . acetaminophen (TYLENOL) 500 MG tablet Take 1,000 mg by mouth every 6 (six) hours as needed. For pain       Scheduled:   . antiseptic oral rinse  15 mL Mouth Rinse BID  . pantoprazole (PROTONIX) IV  40 mg Intravenous QHS  . piperacillin-tazobactam (ZOSYN)  IV  3.375 g Intravenous Q8H   Continuous:   . dextrose 5 % and 0.45 % NaCl with KCl 20 mEq/L 100 mL/hr at 12/04/11 1453   BJY:[id number], diphenhydrAMINE, iohexol,  morphine injection, ondansetron  Results for orders placed during the hospital encounter of 12/02/11 (from the past 48 hour(s))  MRSA PCR SCREENING     Status: Normal   Collection Time   12/02/11 11:36 PM      Component Value Range Comment   MRSA by PCR NEGATIVE  NEGATIVE    COMPREHENSIVE METABOLIC PANEL     Status: Abnormal   Collection Time   12/03/11  4:20 AM      Component Value Range Comment   Sodium 131 (*) 135 - 145 (mEq/L)    Potassium 3.9  3.5 - 5.1 (mEq/L)    Chloride 101  96 - 112 (mEq/L)    CO2 22  19 - 32 (mEq/L)    Glucose, Bld 112 (*) 70 - 99 (mg/dL)    BUN 15  6 - 23 (mg/dL)    Creatinine, Ser 4.69  0.50 - 1.35 (mg/dL)    Calcium 62.9  8.4 - 10.5 (mg/dL)    Total Protein 5.6 (*) 6.0 - 8.3 (g/dL)    Albumin 2.9 (*) 3.5 - 5.2 (g/dL)    AST 25  0 - 37 (U/L)    ALT 10  0 - 53 (U/L)    Alkaline Phosphatase 62  39 - 117 (U/L)  Total Bilirubin 0.9  0.3 - 1.2 (mg/dL)    GFR calc non Af Amer 74 (*) >90 (mL/min)    GFR calc Af Amer 85 (*) >90 (mL/min)   MAGNESIUM     Status: Normal   Collection Time   12/03/11  4:20 AM      Component Value Range Comment   Magnesium 1.6  1.5 - 2.5 (mg/dL)   PHOSPHORUS     Status: Abnormal   Collection Time   12/03/11  4:20 AM      Component Value Range Comment   Phosphorus 1.8 (*) 2.3 - 4.6 (mg/dL)   CBC     Status: Abnormal   Collection Time   12/03/11  4:20 AM      Component Value Range Comment   WBC 17.3 (*) 4.0 - 10.5 (K/uL)    RBC 3.87 (*) 4.22 - 5.81 (MIL/uL)    Hemoglobin 12.0 (*) 13.0 - 17.0 (g/dL)    HCT 16.1 (*) 09.6 - 52.0 (%)    MCV 92.0  78.0 - 100.0 (fL)    MCH 31.0  26.0 - 34.0 (pg)    MCHC 33.7  30.0 - 36.0 (g/dL)    RDW 04.5  40.9 - 81.1 (%)    Platelets 196  150 - 400 (K/uL)   BODY FLUID CULTURE     Status: Normal (Preliminary result)   Collection Time   12/03/11  1:58 PM      Component Value Range Comment   Specimen Description FLUID GALL BLADDER      Special Requests NONE      Gram Stain        Value: NO  WBC SEEN     MODERATE GRAM POSITIVE COCCI IN PAIRS     IN CLUSTERS   Culture Culture reincubated for better growth      Report Status PENDING     PSA     Status: Abnormal   Collection Time   12/03/11  2:58 PM      Component Value Range Comment   PSA <0.01 (*) <=4.00 (ng/mL)   CEA     Status: Normal   Collection Time   12/03/11  2:58 PM      Component Value Range Comment   CEA <0.5  0.0 - 5.0 (ng/mL)   COMPREHENSIVE METABOLIC PANEL     Status: Abnormal   Collection Time   12/04/11  4:22 AM      Component Value Range Comment   Sodium 133 (*) 135 - 145 (mEq/L)    Potassium 4.0  3.5 - 5.1 (mEq/L)    Chloride 103  96 - 112 (mEq/L)    CO2 23  19 - 32 (mEq/L)    Glucose, Bld 109 (*) 70 - 99 (mg/dL)    BUN 11  6 - 23 (mg/dL)    Creatinine, Ser 9.14  0.50 - 1.35 (mg/dL)    Calcium 78.2 (*) 8.4 - 10.5 (mg/dL)    Total Protein 5.6 (*) 6.0 - 8.3 (g/dL)    Albumin 2.7 (*) 3.5 - 5.2 (g/dL)    AST 22  0 - 37 (U/L)    ALT 13  0 - 53 (U/L)    Alkaline Phosphatase 85  39 - 117 (U/L)    Total Bilirubin 0.8  0.3 - 1.2 (mg/dL)    GFR calc non Af Amer 66 (*) >90 (mL/min)    GFR calc Af Amer 77 (*) >90 (mL/min)   CBC     Status: Abnormal  Collection Time   12/04/11  4:22 AM      Component Value Range Comment   WBC 10.9 (*) 4.0 - 10.5 (K/uL)    RBC 3.71 (*) 4.22 - 5.81 (MIL/uL)    Hemoglobin 11.5 (*) 13.0 - 17.0 (g/dL)    HCT 19.1 (*) 47.8 - 52.0 (%)    MCV 92.5  78.0 - 100.0 (fL)    MCH 31.0  26.0 - 34.0 (pg)    MCHC 33.5  30.0 - 36.0 (g/dL)    RDW 29.5  62.1 - 30.8 (%)    Platelets 195  150 - 400 (K/uL)   DIFFERENTIAL     Status: Abnormal   Collection Time   12/04/11  4:22 AM      Component Value Range Comment   Neutrophils Relative 80 (*) 43 - 77 (%)    Neutro Abs 8.7 (*) 1.7 - 7.7 (K/uL)    Lymphocytes Relative 10 (*) 12 - 46 (%)    Lymphs Abs 1.1  0.7 - 4.0 (K/uL)    Monocytes Relative 7  3 - 12 (%)    Monocytes Absolute 0.8  0.1 - 1.0 (K/uL)    Eosinophils Relative 3  0 - 5 (%)      Eosinophils Absolute 0.3  0.0 - 0.7 (K/uL)    Basophils Relative 0  0 - 1 (%)    Basophils Absolute 0.0  0.0 - 0.1 (K/uL)     Ct Chest W Contrast  12/03/2011  *RADIOLOGY REPORT*  Clinical Data: Chest mass.  CT CHEST WITH CONTRAST  Technique:  Multidetector CT imaging of the chest was performed following the standard protocol during bolus administration of intravenous contrast.  Contrast: 80mL OMNIPAQUE IOHEXOL 300 MG/ML  SOLN  Comparison: CT abdomen and pelvis 12/02/2011.  Findings: There is a large mass immediately adjacent to the left heart border measuring 6.2 cm transverse by 9.3 cm AP by 8.7 cm cranial-caudal. It is contiguous with the left ventricle but there appears to be a fat plane between the lesion and the pulmonary outflow tract and left ventricle.  The lesion appears to within the mediastinum and could be lymphoma or less likely thymoma. Bronchogenic carcinoma cannot be excluded.  The patient has a very small right pleural effusion and basilar airspace disease.  Heart size is normal.  Coronary and aortic atherosclerotic vascular disease is noted.  Dependent atelectasis is seen on the right.  Incidentally imaged upper abdomen shows partial visualization of a drain within the gallbladder.  No focal bony abnormality.  IMPRESSION: Large mass along the left heart border appears to be within the mediastinum and could be due to lymphoma or less likely thymoma. The lesion could be within lung and due to bronchogenic carcinoma.  Original Report Authenticated By: Bernadene Bell. Maricela Curet, M.D.   US Abdomen Complete  12/02/2011  *RADIOLOGY REPORT*  Clinical Data:  Right upper quadrant pain and fever  ABDOMINAL ULTRASOUND COMPLETE  Comparison:  None.  Findings:  Gallbladder:   There are multiple small gallstones and tumefactive sludge within the gallbladder lumen.  There is gallbladder wall thickening and pericholecystic fluid.  There is dirty shadowing from a portion of the gallbladder wall which is  worrisome for gas.  Common Bile Duct:  Within normal limits in caliber.  Measures 4 mm.  Liver: No focal mass lesion identified.  Within normal limits in parenchymal echogenicity.  IVC:  Appears normal.  Pancreas:  No abnormality identified.  Spleen:  Within normal limits in size and echotexture.  Right kidney:  Normal in size and parenchymal echogenicity.  No evidence of mass or hydronephrosis.  Left kidney:  Normal in size and parenchymal echogenicity.  No evidence of mass or hydronephrosis.  Abdominal Aorta:  No aneurysm identified.  IMPRESSION: Cholelithiasis with cholecystitis.  Gas within a portion of the gallbladder wall is also suspected, consistent with emphysematous cholecystitis. Discussed with Dr. Manus Gunning.  Original Report Authenticated By: Brandon Melnick, M.D.   Ct Abdomen Pelvis W Contrast  12/02/2011  *RADIOLOGY REPORT*  Clinical Data: Short of breath.  Pain all over.  History of prostate cancer.  CT ABDOMEN AND PELVIS WITH CONTRAST  Technique:  Multidetector CT imaging of the abdomen and pelvis was performed following the standard protocol during bolus administration of intravenous contrast.  Contrast: OMNIPAQUE IOHEXOL 300 MG/ML  SOLN  Comparison: 12/02/2011 abdominal ultrasound.  Findings: Lung Bases: There is a mass in the inferior left chest abutting the pericardium.  Fat plane between the mass and the left ventricle is effaced.  Severe coronary artery atherosclerosis is present.  The dependent atelectasis is present in the right lung.  Liver:  Tiny left hepatic lobe low density lesions probably representing cysts or hemangioma.  No mass lesion.  Small diaphragmatic lymph node incidentally noted (image 20 series 2).  Spleen:  Normal.  Gallbladder:  Inflamed.  Dependently layering high-density material most compatible with cholelithiasis.  Pericholecystic fluid and fat stranding.  Likely reactive mural thickening of the hepatic flexure of the colon.  Common bile duct:  No intra or  extrahepatic biliary ductal dilation.  No common duct stone.  Pancreas:  Normal.  Adrenal glands:  Mild thickening of the left adrenal gland, likely representing hyperplasia.  Kidneys:  Nonobstructing bilateral renal collecting system calculi. Normal renal enhancement.  Largest cluster of calculi in the right inferior renal pole measuring 10 mm x 9 mm.  13 mm left interpolar simple renal cyst.  Retroaortic left renal vein incidentally noted.  Stomach:  Within normal limits.  Small bowel:  No small bowel obstruction. Penile prosthesis reservoir in the right lower quadrant.  Colon:   Diverticulosis.  Inflammatory changes of the hepatic flexure.  Pelvic Genitourinary:  Urinary bladder appears within normal limits.  Surgical clips in the anatomic pelvis compatible with prostatectomy.  Tubing is present in the right inguinal region compatible with a prosthesis.  Bones:  Lumbar spondylosis and scoliosis.  Degenerative endplate changes.  Lower lumbar facet arthrosis.  No aggressive osseous lesions are identified.  There is no retroperitoneal adenopathy.  No inguinal adenopathy.  Vasculature: There are right iliac atherosclerosis without aneurysm.  No acute vascular abnormality.  IMPRESSION: 1.  Acute cholecystitis with inflammatory changes extending to the hepatic flexure of the colon.  Cholelithiasis.  2.  Nephrolithiasis.  No hydronephrosis.  Renal cyst.  3.  Atherosclerosis and coronary artery disease.  4.  9.5 cm x 6.8 cm mass is partially visualized in the inferior chest.  This would be atypical presentation for metastatic prostate cancer in the absence of bone lesions or retroperitoneal adenopathy.  Primary bronchogenic carcinoma is the top consideration although the appearance is nonspecific.  Original Report Authenticated By: Andreas Newport, M.D.   Ir Perc Cholecystostomy  12/03/2011  *RADIOLOGY REPORT*  Clinical Data: Acute cholecystitis, nonoperative candidate  ULTRASOUND FLUOROSCOPIC PERCUTANEOUS TRANSHEPATIC  CHOLECYSTOSTOMY  Date:  12/03/2011 14:15:00  Radiologist:  M. Ruel Favors, M.D.  Medications:  The patient is already receiving daily antibiotics, 1% lidocaine locally.  Conscious sedation was not necessary.  Guidance:  Ultrasound fluoroscopic  Fluoroscopy time:  0.5 minutes  Sedation time:  None.  Contrast volume:  5 ml Omnipaque-300  Complications:  No immediate  PROCEDURE/FINDINGS:  Informed consent was obtained from the patient following explanation of the procedure, risks, benefits and alternatives. The patient understands, agrees and consents for the procedure. All questions were addressed.  A time out was performed.  Maximal barrier sterile technique utilized including caps, mask, sterile gowns, sterile gloves, large sterile drape, hand hygiene, and betadine  Previous CT reviewed.  Acute calculus cholecystitis noted. Preliminary ultrasound performed demonstrating a distended thick walled gallbladder containing gallstones.  Under sterile conditions and local anesthesia, a percutaneous access needle was advanced from a right upper quadrant transhepatic approach into the gallbladder with ultrasound.  Images obtained for documentation. There was return of bile.  Guide wire advanced followed by the Accustick dilator.  This allowed exchange for an Amplatz guide wire.  10-French drain advanced over the Amplatz guide wire. Retention loop formed in the gallbladder.  Syringe aspiration yielded 80 ml bile.  Sample sent for Gram stain and culture. Contrast injection confirms position.  Filling defects in the gallbladder noted compatible with gallstones.  Catheter secured with a Prolene suture and connected to external drainage.  Sterile dressing applied.  No immediate complication.  The patient tolerated the procedure well.  IMPRESSION: Successful ultrasound and fluoroscopic transhepatic 10-French cholecystostomy.  Original Report Authenticated By: Judie Petit. Ruel Favors, M.D.    Review of Systems  Constitutional: Positive  for malaise/fatigue and diaphoresis. Negative for fever, chills and weight loss.  HENT: Negative.   Eyes: Negative.   Respiratory: Negative for cough, hemoptysis and shortness of breath.   Cardiovascular: Positive for chest pain. Negative for palpitations, orthopnea, claudication, leg swelling and PND.  Gastrointestinal: Positive for nausea, vomiting, abdominal pain and diarrhea. Negative for blood in stool and melena.  Genitourinary: Negative.   Musculoskeletal: Negative.   Skin: Negative.   Neurological: Negative.  Negative for weakness.  Endo/Heme/Allergies: Negative.   Psychiatric/Behavioral: Negative.    Blood pressure 129/69, pulse 73, temperature 98 F (36.7 C), temperature source Oral, resp. rate 18, height 6\' 1"  (1.854 m), weight 78.7 kg (173 lb 8 oz), SpO2 97.00%. Physical Exam  Constitutional: He is oriented to person, place, and time. He appears well-developed and well-nourished. No distress.  HENT:  Head: Normocephalic and atraumatic.  Mouth/Throat: Oropharynx is clear and moist.  Eyes: Conjunctivae and EOM are normal. Pupils are equal, round, and reactive to light.  Neck: Neck supple. No JVD present. No tracheal deviation present. No thyromegaly present.  Cardiovascular: Regular rhythm, normal heart sounds and intact distal pulses.  Exam reveals no gallop and no friction rub.   No murmur heard. Respiratory: Effort normal and breath sounds normal. He exhibits no tenderness.  GI: Soft. Bowel sounds are normal. He exhibits no distension and no mass. There is no tenderness.  Musculoskeletal: Normal range of motion. He exhibits no edema.  Lymphadenopathy:    He has no cervical adenopathy.  Neurological: He is alert and oriented to person, place, and time. He has normal strength. No cranial nerve deficit or sensory deficit.       Slow to respond to questions. Acts like he has some degree of dementia.  Skin: Skin is warm and dry.  Psychiatric: His affect is blunt. His speech is  delayed. He is slowed.   CT CHEST WITH CONTRAST   Technique:  Multidetector CT imaging of the chest was performed following the standard protocol during bolus administration  of intravenous contrast.   Contrast: 80mL OMNIPAQUE IOHEXOL 300 MG/ML  SOLN   Comparison: CT abdomen and pelvis 12/02/2011.   Findings: There is a large mass immediately adjacent to the left heart border measuring 6.2 cm transverse by 9.3 cm AP by 8.7 cm cranial-caudal. It is contiguous with the left ventricle but there appears to be a fat plane between the lesion and the pulmonary outflow tract and left ventricle.  The lesion appears to within the mediastinum and could be lymphoma or less likely thymoma. Bronchogenic carcinoma cannot be excluded.  The patient has a very small right pleural effusion and basilar airspace disease.  Heart size is normal.  Coronary and aortic atherosclerotic vascular disease is noted.  Dependent atelectasis is seen on the right.   Incidentally imaged upper abdomen shows partial visualization of a drain within the gallbladder.  No focal bony abnormality.   IMPRESSION: Large mass along the left heart border appears to be within the mediastinum and could be due to lymphoma or less likely thymoma. The lesion could be within lung and due to bronchogenic carcinoma.   Original Report Authenticated By: Bernadene Bell. Maricela Curet, M.D.  Assessment/Plan:  Patient has a large mass in his left chest adjacent to the left ventricle that could be a mediastinal tumor but could also be within the lung itself and could be a bronchogenic carcinoma. This should be amenable to CT-guided needle biopsy and I think that would be the best initial method of biopsy and would avoid general anesthesia. If it turns out that this looks like it may be a lymphoma the he may need an open biopsy to get enough tissue for further testing. Interventional radiology should be able to biopsy this early next week. I will put in  the order for that. He is 63 and it is not clear to me what his functional status is so I would be as conservative as possible for now.  Eulalio Reamy K 12/04/2011, 2:54 PM

## 2011-12-05 DIAGNOSIS — K810 Acute cholecystitis: Secondary | ICD-10-CM

## 2011-12-05 DIAGNOSIS — C61 Malignant neoplasm of prostate: Secondary | ICD-10-CM

## 2011-12-05 DIAGNOSIS — A413 Sepsis due to Hemophilus influenzae: Secondary | ICD-10-CM

## 2011-12-05 DIAGNOSIS — R222 Localized swelling, mass and lump, trunk: Secondary | ICD-10-CM

## 2011-12-05 LAB — CBC
MCV: 92.2 fL (ref 78.0–100.0)
Platelets: 256 10*3/uL (ref 150–400)
RBC: 4.1 MIL/uL — ABNORMAL LOW (ref 4.22–5.81)
RDW: 13.4 % (ref 11.5–15.5)
WBC: 6.7 10*3/uL (ref 4.0–10.5)

## 2011-12-05 LAB — BASIC METABOLIC PANEL
CO2: 21 mEq/L (ref 19–32)
Calcium: 11.3 mg/dL — ABNORMAL HIGH (ref 8.4–10.5)
Creatinine, Ser: 0.86 mg/dL (ref 0.50–1.35)
GFR calc Af Amer: 90 mL/min — ABNORMAL LOW (ref >90)
GFR calc non Af Amer: 77 mL/min — ABNORMAL LOW (ref >90)
Sodium: 135 mEq/L (ref 135–145)

## 2011-12-05 MED ORDER — SIMETHICONE 40 MG/0.6ML PO SUSP: 40.0000 mg | Freq: Four times a day (QID) | ORAL | Status: DC

## 2011-12-05 MED ORDER — AMOXICILLIN-POT CLAVULANATE 875-125 MG PO TABS
1.0000 | ORAL_TABLET | Freq: Two times a day (BID) | ORAL | Status: DC
  Administered 2011-12-05 – 2011-12-09 (×8): 1 via ORAL
  Filled 2011-12-05 (×10): qty 1

## 2011-12-05 MED ORDER — SODIUM CHLORIDE 0.9 % IJ SOLN
INTRAMUSCULAR | Status: AC
  Administered 2011-12-05: 10 mL
  Filled 2011-12-05: qty 10

## 2011-12-05 MED ORDER — SIMETHICONE 40 MG/0.6ML PO SUSP
40.0000 mg | Freq: Three times a day (TID) | ORAL | Status: DC
  Administered 2011-12-05 – 2011-12-06 (×5): 40 mg via ORAL
  Filled 2011-12-05 (×9): qty 0.6

## 2011-12-05 NOTE — Progress Notes (Signed)
[facility]  Interim history: 78 y/o with h/o prostate cancer presenting with abd pain (no vomiting) and found to have acute cholecystitis and admitted to surgery. Also, found to have a large mass in left chest. Therefore, a cholecystectomy was not done. A cholecystostomy tube was placed by IR on 5/10. He was transferred over to the hospitalist service on 5/10.   Consultants: Surgery  Antibiotics Zosyn  Subjective: C/o pain in RUQ- Just given Morphine- slightly confused today- per RN he did not sleep last night and was restless.  Tolerating diet without exacerbation in pain after meals.   Objective: Blood pressure 117/66, pulse 71, temperature 98.2 F (36.8 C), temperature source Oral, resp. rate 17, height 6\' 1"  (1.854 m), weight 78.7 kg (173 lb 8 oz), SpO2 96.00%. Weight change:   Intake/Output Summary (Last 24 hours) at 12/05/11 1735 Last data filed at 12/05/11 1600  Gross per 24 hour  Intake   2320 ml  Output   3495 ml  Net  -1175 ml    Physical Exam: General appearance: alert, cooperative and no distress- confused Lungs: clear to auscultation bilaterally and decreased breath sounds in left lower chest Heart: regular rate and rhythm, S1, S2 normal, no murmur, click, rub or gallop Abdomen: chole tube draining bilious fluid, mild tenderness in RUQ- not around tube insertion but lower- bowel sounds positive abdomen mildly distended and tympanic Extremities: extremities normal, atraumatic, no cyanosis or edema  Lab Results:  Basename 12/05/11 1000 12/04/11 0422 12/03/11 0420  NA 135 133* --  K 4.0 4.0 --  CL 104 103 --  CO2 21 23 --  GLUCOSE 138* 109* --  BUN 10 11 --  CREATININE 0.86 1.01 --  CALCIUM 11.3* 10.7* --  MG -- -- 1.6  PHOS -- -- 1.8*    Basename 12/04/11 0422 12/03/11 0420  AST 22 25  ALT 13 10  ALKPHOS 85 62  BILITOT 0.8 0.9  PROT 5.6* 5.6*  ALBUMIN 2.7* 2.9*   No results found for this basename: LIPASE:2,AMYLASE:2 in the last 72  hours  Basename 12/05/11 1000 12/04/11 0422  WBC 6.7 10.9*  NEUTROABS -- 8.7*  HGB 12.6* 11.5*  HCT 37.8* 34.3*  MCV 92.2 92.5  PLT 256 195   No results found for this basename: CKTOTAL:3,CKMB:3,CKMBINDEX:3,TROPONINI:3 in the last 72 hours No components found with this basename: POCBNP:3 No results found for this basename: DDIMER:2 in the last 72 hours No results found for this basename: HGBA1C:2 in the last 72 hours No results found for this basename: CHOL:2,HDL:2,LDLCALC:2,TRIG:2,CHOLHDL:2,LDLDIRECT:2 in the last 72 hours No results found for this basename: TSH,T4TOTAL,FREET3,T3FREE,THYROIDAB in the last 72 hours No results found for this basename: VITAMINB12:2,FOLATE:2,FERRITIN:2,TIBC:2,IRON:2,RETICCTPCT:2 in the last 72 hours  Micro Results: Recent Results (from the past 240 hour(s))  MRSA PCR SCREENING     Status: Normal   Collection Time   12/02/11 11:36 PM      Component Value Range Status Comment   MRSA by PCR NEGATIVE  NEGATIVE  Final   BODY FLUID CULTURE     Status: Normal (Preliminary result)   Collection Time   12/03/11  1:58 PM      Component Value Range Status Comment   Specimen Description FLUID GALL BLADDER   Final    Special Requests NONE   Final    Gram Stain     Final    Value: NO WBC SEEN     MODERATE GRAM POSITIVE COCCI IN PAIRS     IN CLUSTERS  Culture ABUNDANT GROUP B STREP(S.AGALACTIAE)ISOLATED   Final    Report Status PENDING   Incomplete     Studies/Results: Ct Chest W Contrast  12/03/2011  *RADIOLOGY REPORT*  Clinical Data: Chest mass.  CT CHEST WITH CONTRAST  Technique:  Multidetector CT imaging of the chest was performed following the standard protocol during bolus administration of intravenous contrast.  Contrast: 80mL OMNIPAQUE IOHEXOL 300 MG/ML  SOLN  Comparison: CT abdomen and pelvis 12/02/2011.  Findings: There is a large mass immediately adjacent to the left heart border measuring 6.2 cm transverse by 9.3 cm AP by 8.7 cm cranial-caudal. It is  contiguous with the left ventricle but there appears to be a fat plane between the lesion and the pulmonary outflow tract and left ventricle.  The lesion appears to within the mediastinum and could be lymphoma or less likely thymoma. Bronchogenic carcinoma cannot be excluded.  The patient has a very small right pleural effusion and basilar airspace disease.  Heart size is normal.  Coronary and aortic atherosclerotic vascular disease is noted.  Dependent atelectasis is seen on the right.  Incidentally imaged upper abdomen shows partial visualization of a drain within the gallbladder.  No focal bony abnormality.  IMPRESSION: Large mass along the left heart border appears to be within the mediastinum and could be due to lymphoma or less likely thymoma. The lesion could be within lung and due to bronchogenic carcinoma.  Original Report Authenticated By: Bernadene Bell. Maricela Curet, M.D.   US Abdomen Complete  12/02/2011  *RADIOLOGY REPORT*  Clinical Data:  Right upper quadrant pain and fever  ABDOMINAL ULTRASOUND COMPLETE  Comparison:  None.  Findings:  Gallbladder:   There are multiple small gallstones and tumefactive sludge within the gallbladder lumen.  There is gallbladder wall thickening and pericholecystic fluid.  There is dirty shadowing from a portion of the gallbladder wall which is worrisome for gas.  Common Bile Duct:  Within normal limits in caliber.  Measures 4 mm.  Liver: No focal mass lesion identified.  Within normal limits in parenchymal echogenicity.  IVC:  Appears normal.  Pancreas:  No abnormality identified.  Spleen:  Within normal limits in size and echotexture.  Right kidney:  Normal in size and parenchymal echogenicity.  No evidence of mass or hydronephrosis.  Left kidney:  Normal in size and parenchymal echogenicity.  No evidence of mass or hydronephrosis.  Abdominal Aorta:  No aneurysm identified.  IMPRESSION: Cholelithiasis with cholecystitis.  Gas within a portion of the gallbladder wall is also  suspected, consistent with emphysematous cholecystitis. Discussed with Dr. Manus Gunning.  Original Report Authenticated By: Brandon Melnick, M.D.   Ct Abdomen Pelvis W Contrast  12/02/2011  *RADIOLOGY REPORT*  Clinical Data: Short of breath.  Pain all over.  History of prostate cancer.  CT ABDOMEN AND PELVIS WITH CONTRAST  Technique:  Multidetector CT imaging of the abdomen and pelvis was performed following the standard protocol during bolus administration of intravenous contrast.  Contrast: OMNIPAQUE IOHEXOL 300 MG/ML  SOLN  Comparison: 12/02/2011 abdominal ultrasound.  Findings: Lung Bases: There is a mass in the inferior left chest abutting the pericardium.  Fat plane between the mass and the left ventricle is effaced.  Severe coronary artery atherosclerosis is present.  The dependent atelectasis is present in the right lung.  Liver:  Tiny left hepatic lobe low density lesions probably representing cysts or hemangioma.  No mass lesion.  Small diaphragmatic lymph node incidentally noted (image 20 series 2).  Spleen:  Normal.  Gallbladder:  Inflamed.  Dependently layering high-density material most compatible with cholelithiasis.  Pericholecystic fluid and fat stranding.  Likely reactive mural thickening of the hepatic flexure of the colon.  Common bile duct:  No intra or extrahepatic biliary ductal dilation.  No common duct stone.  Pancreas:  Normal.  Adrenal glands:  Mild thickening of the left adrenal gland, likely representing hyperplasia.  Kidneys:  Nonobstructing bilateral renal collecting system calculi. Normal renal enhancement.  Largest cluster of calculi in the right inferior renal pole measuring 10 mm x 9 mm.  13 mm left interpolar simple renal cyst.  Retroaortic left renal vein incidentally noted.  Stomach:  Within normal limits.  Small bowel:  No small bowel obstruction. Penile prosthesis reservoir in the right lower quadrant.  Colon:   Diverticulosis.  Inflammatory changes of the hepatic flexure.   Pelvic Genitourinary:  Urinary bladder appears within normal limits.  Surgical clips in the anatomic pelvis compatible with prostatectomy.  Tubing is present in the right inguinal region compatible with a prosthesis.  Bones:  Lumbar spondylosis and scoliosis.  Degenerative endplate changes.  Lower lumbar facet arthrosis.  No aggressive osseous lesions are identified.  There is no retroperitoneal adenopathy.  No inguinal adenopathy.  Vasculature: There are right iliac atherosclerosis without aneurysm.  No acute vascular abnormality.  IMPRESSION: 1.  Acute cholecystitis with inflammatory changes extending to the hepatic flexure of the colon.  Cholelithiasis.  2.  Nephrolithiasis.  No hydronephrosis.  Renal cyst.  3.  Atherosclerosis and coronary artery disease.  4.  9.5 cm x 6.8 cm mass is partially visualized in the inferior chest.  This would be atypical presentation for metastatic prostate cancer in the absence of bone lesions or retroperitoneal adenopathy.  Primary bronchogenic carcinoma is the top consideration although the appearance is nonspecific.  Original Report Authenticated By: Andreas Newport, M.D.   Ir Perc Cholecystostomy  12/03/2011  *RADIOLOGY REPORT*  Clinical Data: Acute cholecystitis, nonoperative candidate  ULTRASOUND FLUOROSCOPIC PERCUTANEOUS TRANSHEPATIC CHOLECYSTOSTOMY  Date:  12/03/2011 14:15:00  Radiologist:  M. Ruel Favors, M.D.  Medications:  The patient is already receiving daily antibiotics, 1% lidocaine locally.  Conscious sedation was not necessary.  Guidance:  Ultrasound fluoroscopic  Fluoroscopy time:  0.5 minutes  Sedation time:  None.  Contrast volume:  5 ml Omnipaque-300  Complications:  No immediate  PROCEDURE/FINDINGS:  Informed consent was obtained from the patient following explanation of the procedure, risks, benefits and alternatives. The patient understands, agrees and consents for the procedure. All questions were addressed.  A time out was performed.  Maximal barrier  sterile technique utilized including caps, mask, sterile gowns, sterile gloves, large sterile drape, hand hygiene, and betadine  Previous CT reviewed.  Acute calculus cholecystitis noted. Preliminary ultrasound performed demonstrating a distended thick walled gallbladder containing gallstones.  Under sterile conditions and local anesthesia, a percutaneous access needle was advanced from a right upper quadrant transhepatic approach into the gallbladder with ultrasound.  Images obtained for documentation. There was return of bile.  Guide wire advanced followed by the Accustick dilator.  This allowed exchange for an Amplatz guide wire.  10-French drain advanced over the Amplatz guide wire. Retention loop formed in the gallbladder.  Syringe aspiration yielded 80 ml bile.  Sample sent for Gram stain and culture. Contrast injection confirms position.  Filling defects in the gallbladder noted compatible with gallstones.  Catheter secured with a Prolene suture and connected to external drainage.  Sterile dressing applied.  No immediate complication.  The patient tolerated the  procedure well.  IMPRESSION: Successful ultrasound and fluoroscopic transhepatic 10-French cholecystostomy.  Original Report Authenticated By: Judie Petit. Ruel Favors, M.D.   Dg Chest Portable 1 View  12/02/2011  *RADIOLOGY REPORT*  Clinical Data: Shortness of breath.  PORTABLE CHEST - 1 VIEW 12/02/2011 1324 hours:  Comparison: None.  Findings: Cardiac silhouette mildly enlarged even allowing for the AP portable technique, with mildly prominent central pulmonary arteries.  Thoracic aorta atherosclerotic.  Elevation of the right hemidiaphragm.  Linear opacities in both lung bases.  Lungs otherwise clear.  Pulmonary vascularity normal.  No visible pleural effusions.  IMPRESSION: Mild cardiomegaly.  Linear scar or atelectasis in the lung bases. No acute cardiopulmonary disease otherwise.  Original Report Authenticated By: Arnell Sieving, M.D.     Medications: Scheduled Meds:    . antiseptic oral rinse  15 mL Mouth Rinse BID  . pantoprazole (PROTONIX) IV  40 mg Intravenous QHS  . piperacillin-tazobactam (ZOSYN)  IV  3.375 g Intravenous Q8H  . simethicone  40 mg Oral TID WC & HS  . DISCONTD: simethicone  40 mg Oral QID   Continuous Infusions:    . dextrose 5 % and 0.45 % NaCl with KCl 20 mEq/L 100 mL/hr at 12/05/11 1143   PRN Meds:.diphenhydrAMINE, diphenhydrAMINE, morphine injection, ondansetron  Assessment/Plan:  Acute calculous cholecystitis with sepsis S/p cholecystostomy tube. Group B strep(agalactiae) growing from bile Can change Zosyn to Augmentin Tolerating low fat diet- abd pain may be gas- add simethicone Appreciate IR ans surgical f/u Stop IVF as he is eating well   Lung mass Appreciate CVTS consult- biopsy by IR next week  History of Prostate cancer PSA has been checked and is quite low   Nephrolithiasis without hydronephrosis Noted on imaging  Dementia?  Systolic and diastolic CHF- chronic- ECHO 12/04/11 EF 45-50% and gr 1 diastolic dysf  Mild Hypercalcemia Obtain PTH- intact and ionized calcium  Disposition Pt eval - OOB-  no family noted at bedside.  tx out of SDU today   LOS: 3 days   [facility] [phone] 12/05/2011, 5:35 PM

## 2011-12-05 NOTE — Progress Notes (Signed)
Subjective: Feeling better since drain placed 5/10. Still tender along RUQ.  Wanting to be shaved. States pain meds do ease his pain.   Objective: Vital signs in last 24 hours: Temp:  [97.6 F (36.4 C)-98.5 F (36.9 C)] 98.3 F (36.8 C) (05/12 0754) Pulse Rate:  [63-75] 67  (05/12 0754) Resp:  [16-19] 16  (05/12 0754) BP: (115-142)/(61-80) 139/74 mmHg (05/12 0754) SpO2:  [95 %-97 %] 97 % (05/12 0754) Last BM Date: 12/05/11  Intake/Output from previous day: 05/11 0701 - 05/12 0700 In: 2590 [P.O.:240; I.V.:2300; IV Piggyback:50] Out: 3681 [Urine:3500; Drains:180; Stool:1] Intake/Output this shift: Total I/O In: 100 [I.V.:100] Out: -   PE:  Awake, drain intact draining bilious fluid noted in bag. .  Insertion site clean and dry.  Abdomen soft, non-distended. Positive bowel sounds.  Mildly tender at drain site. Leukocytosis improved since drainage.   Lab Results:   Basename 12/04/11 0422 12/03/11 0420  WBC 10.9* 17.3*  HGB 11.5* 12.0*  HCT 34.3* 35.6*  PLT 195 196   BMET  Basename 12/04/11 0422 12/03/11 0420  NA 133* 131*  K 4.0 3.9  CL 103 101  CO2 23 22  GLUCOSE 109* 112*  BUN 11 15  CREATININE 1.01 0.97  CALCIUM 10.7* 10.5   PT/INR  Basename 12/02/11 1300  LABPROT 15.0  INR 1.16    Studies/Results: Ct Chest W Contrast  12/03/2011  *RADIOLOGY REPORT*  Clinical Data: Chest mass.  CT CHEST WITH CONTRAST  Technique:  Multidetector CT imaging of the chest was performed following the standard protocol during bolus administration of intravenous contrast.  Contrast: 80mL OMNIPAQUE IOHEXOL 300 MG/ML  SOLN  Comparison: CT abdomen and pelvis 12/02/2011.  Findings: There is a large mass immediately adjacent to the left heart border measuring 6.2 cm transverse by 9.3 cm AP by 8.7 cm cranial-caudal. It is contiguous with the left ventricle but there appears to be a fat plane between the lesion and the pulmonary outflow tract and left ventricle.  The lesion appears to  within the mediastinum and could be lymphoma or less likely thymoma. Bronchogenic carcinoma cannot be excluded.  The patient has a very small right pleural effusion and basilar airspace disease.  Heart size is normal.  Coronary and aortic atherosclerotic vascular disease is noted.  Dependent atelectasis is seen on the right.  Incidentally imaged upper abdomen shows partial visualization of a drain within the gallbladder.  No focal bony abnormality.  IMPRESSION: Large mass along the left heart border appears to be within the mediastinum and could be due to lymphoma or less likely thymoma. The lesion could be within lung and due to bronchogenic carcinoma.  Original Report Authenticated By: Bernadene Bell. Maricela Curet, M.D.   Ir Perc Cholecystostomy  12/03/2011  *RADIOLOGY REPORT*  Clinical Data: Acute cholecystitis, nonoperative candidate  ULTRASOUND FLUOROSCOPIC PERCUTANEOUS TRANSHEPATIC CHOLECYSTOSTOMY  Date:  12/03/2011 14:15:00  Radiologist:  M. Ruel Favors, M.D.  Medications:  The patient is already receiving daily antibiotics, 1% lidocaine locally.  Conscious sedation was not necessary.  Guidance:  Ultrasound fluoroscopic  Fluoroscopy time:  0.5 minutes  Sedation time:  None.  Contrast volume:  5 ml Omnipaque-300  Complications:  No immediate  PROCEDURE/FINDINGS:  Informed consent was obtained from the patient following explanation of the procedure, risks, benefits and alternatives. The patient understands, agrees and consents for the procedure. All questions were addressed.  A time out was performed.  Maximal barrier sterile technique utilized including caps, mask, sterile gowns, sterile gloves, large sterile drape,  hand hygiene, and betadine  Previous CT reviewed.  Acute calculus cholecystitis noted. Preliminary ultrasound performed demonstrating a distended thick walled gallbladder containing gallstones.  Under sterile conditions and local anesthesia, a percutaneous access needle was advanced from a right upper  quadrant transhepatic approach into the gallbladder with ultrasound.  Images obtained for documentation. There was return of bile.  Guide wire advanced followed by the Accustick dilator.  This allowed exchange for an Amplatz guide wire.  10-French drain advanced over the Amplatz guide wire. Retention loop formed in the gallbladder.  Syringe aspiration yielded 80 ml bile.  Sample sent for Gram stain and culture. Contrast injection confirms position.  Filling defects in the gallbladder noted compatible with gallstones.  Catheter secured with a Prolene suture and connected to external drainage.  Sterile dressing applied.  No immediate complication.  The patient tolerated the procedure well.  IMPRESSION: Successful ultrasound and fluoroscopic transhepatic 10-French cholecystostomy.  Original Report Authenticated By: Judie Petit. Ruel Favors, M.D.    Anti-infectives: Anti-infectives     Start     Dose/Rate Route Frequency Ordered Stop   12/02/11 2200   piperacillin-tazobactam (ZOSYN) IVPB 3.375 g        3.375 g 12.5 mL/hr over 240 Minutes Intravenous 3 times per day 12/02/11 2036     12/02/11 1545   piperacillin-tazobactam (ZOSYN) IVPB 3.375 g        3.375 g 12.5 mL/hr over 240 Minutes Intravenous  Once 12/02/11 1543 12/02/11 2102          Assessment/Plan: Cholecystis s/p percutaneous drainage 5/10 in IR with improvement in symptoms, leukocytosis and draining well.  Drain to remain minimum of 6 weeks then to follow with surgery.  IR available PRN drain issues.    LOS: 3 days    Shaneca Orne D 12/05/2011

## 2011-12-05 NOTE — Progress Notes (Signed)
Subjective: Pain at drain site  Objective: Vital signs in last 24 hours: Temp:  [97.6 F (36.4 C)-98.5 F (36.9 C)] 98.3 F (36.8 C) (05/12 0754) Pulse Rate:  [63-75] 67  (05/12 0754) Resp:  [16-19] 16  (05/12 0754) BP: (115-142)/(61-80) 139/74 mmHg (05/12 0754) SpO2:  [95 %-97 %] 97 % (05/12 0754) Last BM Date: 12/05/11  Intake/Output from previous day: 05/11 0701 - 05/12 0700 In: 2590 [P.O.:240; I.V.:2300; IV Piggyback:50] Out: 3681 [Urine:3500; Drains:180; Stool:1] Intake/Output this shift: Total I/O In: 100 [I.V.:100] Out: -   perc drain site clean.  sore.  abdomen otherwise benign  Lab Results:   Basename 12/04/11 0422 12/03/11 0420  WBC 10.9* 17.3*  HGB 11.5* 12.0*  HCT 34.3* 35.6*  PLT 195 196   BMET  Basename 12/04/11 0422 12/03/11 0420  NA 133* 131*  K 4.0 3.9  CL 103 101  CO2 23 22  GLUCOSE 109* 112*  BUN 11 15  CREATININE 1.01 0.97  CALCIUM 10.7* 10.5   PT/INR  Basename 12/02/11 1300  LABPROT 15.0  INR 1.16   ABG No results found for this basename: PHART:2,PCO2:2,PO2:2,HCO3:2 in the last 72 hours  Studies/Results: Ct Chest W Contrast  12/03/2011  *RADIOLOGY REPORT*  Clinical Data: Chest mass.  CT CHEST WITH CONTRAST  Technique:  Multidetector CT imaging of the chest was performed following the standard protocol during bolus administration of intravenous contrast.  Contrast: 80mL OMNIPAQUE IOHEXOL 300 MG/ML  SOLN  Comparison: CT abdomen and pelvis 12/02/2011.  Findings: There is a large mass immediately adjacent to the left heart border measuring 6.2 cm transverse by 9.3 cm AP by 8.7 cm cranial-caudal. It is contiguous with the left ventricle but there appears to be a fat plane between the lesion and the pulmonary outflow tract and left ventricle.  The lesion appears to within the mediastinum and could be lymphoma or less likely thymoma. Bronchogenic carcinoma cannot be excluded.  The patient has a very small right pleural effusion and basilar  airspace disease.  Heart size is normal.  Coronary and aortic atherosclerotic vascular disease is noted.  Dependent atelectasis is seen on the right.  Incidentally imaged upper abdomen shows partial visualization of a drain within the gallbladder.  No focal bony abnormality.  IMPRESSION: Large mass along the left heart border appears to be within the mediastinum and could be due to lymphoma or less likely thymoma. The lesion could be within lung and due to bronchogenic carcinoma.  Original Report Authenticated By: Bernadene Bell. Maricela Curet, M.D.   Ir Perc Cholecystostomy  12/03/2011  *RADIOLOGY REPORT*  Clinical Data: Acute cholecystitis, nonoperative candidate  ULTRASOUND FLUOROSCOPIC PERCUTANEOUS TRANSHEPATIC CHOLECYSTOSTOMY  Date:  12/03/2011 14:15:00  Radiologist:  M. Ruel Favors, M.D.  Medications:  The patient is already receiving daily antibiotics, 1% lidocaine locally.  Conscious sedation was not necessary.  Guidance:  Ultrasound fluoroscopic  Fluoroscopy time:  0.5 minutes  Sedation time:  None.  Contrast volume:  5 ml Omnipaque-300  Complications:  No immediate  PROCEDURE/FINDINGS:  Informed consent was obtained from the patient following explanation of the procedure, risks, benefits and alternatives. The patient understands, agrees and consents for the procedure. All questions were addressed.  A time out was performed.  Maximal barrier sterile technique utilized including caps, mask, sterile gowns, sterile gloves, large sterile drape, hand hygiene, and betadine  Previous CT reviewed.  Acute calculus cholecystitis noted. Preliminary ultrasound performed demonstrating a distended thick walled gallbladder containing gallstones.  Under sterile conditions and local anesthesia, a  percutaneous access needle was advanced from a right upper quadrant transhepatic approach into the gallbladder with ultrasound.  Images obtained for documentation. There was return of bile.  Guide wire advanced followed by the Accustick  dilator.  This allowed exchange for an Amplatz guide wire.  10-French drain advanced over the Amplatz guide wire. Retention loop formed in the gallbladder.  Syringe aspiration yielded 80 ml bile.  Sample sent for Gram stain and culture. Contrast injection confirms position.  Filling defects in the gallbladder noted compatible with gallstones.  Catheter secured with a Prolene suture and connected to external drainage.  Sterile dressing applied.  No immediate complication.  The patient tolerated the procedure well.  IMPRESSION: Successful ultrasound and fluoroscopic transhepatic 10-French cholecystostomy.  Original Report Authenticated By: Judie Petit. Ruel Favors, M.D.    Anti-infectives: Anti-infectives     Start     Dose/Rate Route Frequency Ordered Stop   12/02/11 2200   piperacillin-tazobactam (ZOSYN) IVPB 3.375 g        3.375 g 12.5 mL/hr over 240 Minutes Intravenous 3 times per day 12/02/11 2036     12/02/11 1545   piperacillin-tazobactam (ZOSYN) IVPB 3.375 g        3.375 g 12.5 mL/hr over 240 Minutes Intravenous  Once 12/02/11 1543 12/02/11 2102          Assessment/Plan: Acute cholecystitis s/p cholecystostomy drain No change in plan cont ABX and drainage   LOS: 3 days    Naod Sweetland A. 12/05/2011

## 2011-12-06 ENCOUNTER — Encounter (HOSPITAL_COMMUNITY): Payer: Self-pay | Admitting: Radiology

## 2011-12-06 DIAGNOSIS — A413 Sepsis due to Hemophilus influenzae: Secondary | ICD-10-CM

## 2011-12-06 DIAGNOSIS — R222 Localized swelling, mass and lump, trunk: Secondary | ICD-10-CM

## 2011-12-06 DIAGNOSIS — K810 Acute cholecystitis: Secondary | ICD-10-CM

## 2011-12-06 LAB — CBC
HCT: 38.6 % — ABNORMAL LOW (ref 39.0–52.0)
Hemoglobin: 12.7 g/dL — ABNORMAL LOW (ref 13.0–17.0)
MCHC: 32.9 g/dL (ref 30.0–36.0)
RDW: 13.3 % (ref 11.5–15.5)
WBC: 8 10*3/uL (ref 4.0–10.5)

## 2011-12-06 LAB — BODY FLUID CULTURE

## 2011-12-06 LAB — PARATHYROID HORMONE, INTACT (NO CA): PTH: 153.4 pg/mL — ABNORMAL HIGH (ref 14.0–72.0)

## 2011-12-06 LAB — BASIC METABOLIC PANEL
Chloride: 100 mEq/L (ref 96–112)
GFR calc Af Amer: 87 mL/min — ABNORMAL LOW (ref >90)
GFR calc non Af Amer: 75 mL/min — ABNORMAL LOW (ref >90)
Glucose, Bld: 94 mg/dL (ref 70–99)
Potassium: 3.9 mEq/L (ref 3.5–5.1)
Sodium: 131 mEq/L — ABNORMAL LOW (ref 135–145)

## 2011-12-06 MED ORDER — ONDANSETRON HCL 4 MG/2ML IJ SOLN: 4.0000 mg | INTRAMUSCULAR | Status: DC | PRN

## 2011-12-06 MED ORDER — SODIUM CHLORIDE 0.9 % IJ SOLN
INTRAMUSCULAR | Status: AC
  Administered 2011-12-06: 10 mL
  Filled 2011-12-06: qty 10

## 2011-12-06 MED ORDER — SIMETHICONE 40 MG/0.6ML PO SUSP
40.0000 mg | Freq: Four times a day (QID) | ORAL | Status: DC | PRN
  Filled 2011-12-06 (×2): qty 0.6

## 2011-12-06 MED ORDER — PANTOPRAZOLE SODIUM 40 MG PO TBEC: 40.0000 mg | DELAYED_RELEASE_TABLET | Freq: Every day | ORAL | Status: DC

## 2011-12-06 NOTE — Progress Notes (Signed)
Oriented pt to room and call bell. Fall risk bundle discussed. New condom cath placed. Bed alarm placed and new orders acknowledged. Skin intact with no breakdown. No O2.

## 2011-12-06 NOTE — Progress Notes (Signed)
[facility] Progress Note    Subjective: Not putting a lot out of cholecystostomy tube.    Objective: Vital signs in last 24 hours: Temp:  [97.7 F (36.5 C)-98.6 F (37 C)] 98.6 F (37 C) (05/13 0800) Pulse Rate:  [66-80] 71  (05/13 0800) Resp:  [14-18] 15  (05/13 0800) BP: (117-140)/(66-90) 120/71 mmHg (05/13 0800) SpO2:  [93 %-97 %] 93 % (05/13 0800) Last BM Date: 12/05/11  Intake/Output from previous day: 05/12 0701 - 05/13 0700 In: 910 [I.V.:910] Out: 2185 [Urine:2100; Drains:85] Intake/Output this shift: Total I/O In: -  Out: 390 [Urine:350; Drains:40]  General: No acute distress  Lungs: clear  Abd: Soft, mildly tender in RUQ, good bowel sounds.  Eating well  Extremities: No changes  Neuro: Intact  Lab Results:  @LABLAST2 (wbc:2,hgb:2,hct:2,plt:2) BMET  Basename 12/06/11 0359 12/05/11 1000  NA 131* 135  K 3.9 4.0  CL 100 104  CO2 23 21  GLUCOSE 94 138*  BUN 12 10  CREATININE 0.93 0.86  CALCIUM 11.4* 11.3*   PT/INR No results found for this basename: LABPROT:2,INR:2 in the last 72 hours ABG No results found for this basename: PHART:2,PCO2:2,PO2:2,HCO3:2 in the last 72 hours  Studies/Results: No results found.  Anti-infectives: Anti-infectives     Start     Dose/Rate Route Frequency Ordered Stop   12/05/11 2200   amoxicillin-clavulanate (AUGMENTIN) 875-125 MG per tablet 1 tablet        1 tablet Oral 2 times daily 12/05/11 1748     12/02/11 2200   piperacillin-tazobactam (ZOSYN) IVPB 3.375 g  Status:  Discontinued        3.375 g 12.5 mL/hr over 240 Minutes Intravenous 3 times per day 12/02/11 2036 12/05/11 1748   12/02/11 1545  piperacillin-tazobactam (ZOSYN) IVPB 3.375 g       3.375 g 12.5 mL/hr over 240 Minutes Intravenous  Once 12/02/11 1543 12/02/11 2102          Assessment/Plan: s/p  Advance diet We will sign off for now.  Make arrangements for this patient to see Dr. Gaynelle Adu for postoperative outpatient follow up  LOS: 4 days     Marta Lamas. Gae Bon, MD, FACS [phone] [facility] Surgery 12/06/2011

## 2011-12-06 NOTE — Progress Notes (Signed)
CRITICAL VALUE ALERT  Critical value received: Ionized calcium  Date of notification:  13 may 13  Time of notification :0210 Critical value read back:yes  Nurse who received alert:  Camelia Eng  MD notified (1st page):  T callahan  Time of first page: 0220  MD notified (2nd page):  Time of second page:  Responding MD: Benedetto Coons NP  Time MD responded: [phone]

## 2011-12-06 NOTE — Progress Notes (Signed)
12/06/2011 5:32 PM Gerrianne Scale  [id number]  Report called to Raoul Pitch, RN on 5500. VSS. Transferred with belongings to new room. Family informed.  Celesta Gentile 5/13/20135:32 PM

## 2011-12-06 NOTE — H&P (Signed)
Chief Complaint: (L)chest mass HPI: Jose Crawford is an 78 y.o. male who has been admitted and treated for cholecystitis. He had a perc chole drain placed 5/10 and has improved markedly from that. He is no longer febrile and his WBC has normalized, though he still has some mild pain in the RUQ. However, he is now found to have a large (L)sided chest mass adjacent to the heart border. CVTS has seen the pt and requests IR do CT guided biopsy to aid in tissue diagnosis. I have spoken to the primary team and he is doing well medically and he is being moved to the floor.  Past Medical History:  Past Medical History  Diagnosis Date  . Basal cell carcinoma   . Prostate cancer     Past Surgical History:  Past Surgical History  Procedure Date   Perc cholecystostomy drain.   . Prostatectomy     Family History: History reviewed. No pertinent family history.  Social History:  reports that he has never smoked. He does not have any smokeless tobacco history on file. He reports that he uses illicit drugs. He reports that he does not drink alcohol.  Allergies: No Known Allergies  Medications: Medications Prior to Admission  Medication Sig Dispense Refill  . acetaminophen (TYLENOL) 500 MG tablet Take 1,000 mg by mouth every 6 (six) hours as needed. For pain        Please HPI for pertinent positives, otherwise complete 10 system ROS negative.  Blood pressure 116/71, pulse 86, temperature 98.8 F (37.1 C), temperature source Oral, resp. rate 17, height 6\' 1"  (1.854 m), weight 173 lb 8 oz (78.7 kg), SpO2 95.00%. Body mass index is 22.89 kg/(m^2).   General Appearance:  Awake, baseline confusion but answers appropriately.  Head:  Normocephalic, without obvious abnormality, atraumatic  ENT: Unremarkable  Neck: Supple, symmetrical, trachea midline, no adenopathy, thyroid: not enlarged, symmetric, no tenderness/mass/nodules  Lungs:   Clear to auscultation bilaterally, no w/r/r, respirations unlabored  without use of accessory muscles.  Chest Wall:  No tenderness or deformity  Heart:  Regular rate and rhythm, S1, S2 normal, no murmur, rub or gallop. Carotids 2+ without bruit.  Abdomen:   Soft, non-tender, non distended. Bowel sounds active all four quadrants,  no masses, no organomegaly. (R)perc drain intact.   Results for orders placed during the hospital encounter of 12/02/11 (from the past 48 hour(s))  BASIC METABOLIC PANEL     Status: Abnormal   Collection Time   12/05/11 10:00 AM      Component Value Range Comment   Sodium 135  135 - 145 (mEq/L)    Potassium 4.0  3.5 - 5.1 (mEq/L)    Chloride 104  96 - 112 (mEq/L)    CO2 21  19 - 32 (mEq/L)    Glucose, Bld 138 (*) 70 - 99 (mg/dL)    BUN 10  6 - 23 (mg/dL)    Creatinine, Ser 1.61  0.50 - 1.35 (mg/dL)    Calcium 09.6 (*) 8.4 - 10.5 (mg/dL)    GFR calc non Af Amer 77 (*) >90 (mL/min)    GFR calc Af Amer 90 (*) >90 (mL/min)   CBC     Status: Abnormal   Collection Time   12/05/11 10:00 AM      Component Value Range Comment   WBC 6.7  4.0 - 10.5 (K/uL)    RBC 4.10 (*) 4.22 - 5.81 (MIL/uL)    Hemoglobin 12.6 (*) 13.0 - 17.0 (g/dL)  HCT 37.8 (*) 39.0 - 52.0 (%)    MCV 92.2  78.0 - 100.0 (fL)    MCH 30.7  26.0 - 34.0 (pg)    MCHC 33.3  30.0 - 36.0 (g/dL)    RDW 02.7  25.3 - 66.4 (%)    Platelets 256  150 - 400 (K/uL) DELTA CHECK NOTED  CALCIUM, IONIZED     Status: Abnormal   Collection Time   12/05/11  6:24 PM      Component Value Range Comment   Calcium, Ion 1.66 (*) 1.12 - 1.32 (mmol/L)   PARATHYROID HORMONE, INTACT (NO CA)     Status: Abnormal   Collection Time   12/05/11  6:25 PM      Component Value Range Comment   PTH 153.4 (*) 14.0 - 72.0 (pg/mL)   CBC     Status: Abnormal   Collection Time   12/06/11  3:59 AM      Component Value Range Comment   WBC 8.0  4.0 - 10.5 (K/uL)    RBC 4.16 (*) 4.22 - 5.81 (MIL/uL)    Hemoglobin 12.7 (*) 13.0 - 17.0 (g/dL)    HCT 40.3 (*) 47.4 - 52.0 (%)    MCV 92.8  78.0 - 100.0 (fL)      MCH 30.5  26.0 - 34.0 (pg)    MCHC 32.9  30.0 - 36.0 (g/dL)    RDW 25.9  56.3 - 87.5 (%)    Platelets 299  150 - 400 (K/uL)   BASIC METABOLIC PANEL     Status: Abnormal   Collection Time   12/06/11  3:59 AM      Component Value Range Comment   Sodium 131 (*) 135 - 145 (mEq/L)    Potassium 3.9  3.5 - 5.1 (mEq/L)    Chloride 100  96 - 112 (mEq/L)    CO2 23  19 - 32 (mEq/L)    Glucose, Bld 94  70 - 99 (mg/dL)    BUN 12  6 - 23 (mg/dL)    Creatinine, Ser 6.43  0.50 - 1.35 (mg/dL)    Calcium 32.9 (*) 8.4 - 10.5 (mg/dL)    GFR calc non Af Amer 75 (*) >90 (mL/min)    GFR calc Af Amer 87 (*) >90 (mL/min)    No results found.  Assessment/Plan (L)chest mass For CT biopsy tomorrow. Spoke with dtr [name] and consent obtained. Stable perc chole drain-to follow up with [facility] after Discharge.  Brayton El PA-C 12/06/2011, 4:31 PM

## 2011-12-06 NOTE — Evaluation (Signed)
Physical Therapy Evaluation Patient Details Name: Jose Crawford MRN: [id number] DOB: 04/16/1926 Today's Date: 12/06/2011 Time: 7846-9629 PT Time Calculation (min): 38 min  PT Assessment / Plan / Recommendation Clinical Impression  Pt admitted with cholecystitis and underwent cholecystostomy tube placement. Abd CT showed Lt lung mass and is to have biopsy soon. Pt was living with daughter and son-in-law PTA, however was alone during days per pt (who is not fully reliable due to decr mental status). Pt will need 24 hour care on discharge home and if family cannot provide, will need SNF.    PT Assessment  Patient needs continued PT services    Follow Up Recommendations  Skilled nursing facility;Supervision/Assistance - 24 hour    Barriers to Discharge Decreased caregiver support      lEquipment Recommendations  Defer to next venue    Recommendations for Other Services OT consult   Frequency Min 3X/week    Precautions / Restrictions Precautions Precautions: Fall   Pertinent Vitals/Pain Denied pain; VSS      Mobility  Bed Mobility Bed Mobility: Rolling Left;Left Sidelying to Sit;Sitting - Scoot to [place] of Bed Rolling Left: 4: Min assist;With rail (to fully roll onto his side) Left Sidelying to Sit: 4: Min assist;With rails;HOB elevated (HOB 30) Sitting - Scoot to Edge of Bed: 4: Min guard (incr effort, time) Details for Bed Mobility Assistance: Pt moving very slowly, to the point he sometimes forgets what he is doing. Requires facilitation to initiate and complete movements. Transfers Transfers: Sit to Stand;Stand to Sit Sit to Stand: 4: Min guard;With upper extremity assist;With armrests;From bed;From chair/3-in-1 Stand to Sit: 4: Min guard;With upper extremity assist;With armrests;To bed;To chair/3-in-1 Details for Transfer Assistance: Safe use of RW with cues (hand placement). No physical assist provided, however pt appears unsteady so minguard  provided. Ambulation/Gait Ambulation/Gait Assistance: 4: Min assist Ambulation Distance (Feet): 45 Feet Assistive device: Rolling walker Ambulation/Gait Assistance Details: Very slow cadence with short shuffling steps and kyphotic posture with RW too far ahead of pt for safe use. Requires assist to maneuver RW safely. Makes corrections for <30 seconds and then goes back to previous method/technique. Gait Pattern: Decreased stride length;Trunk flexed;Shuffle    Exercises     PT Diagnosis: Difficulty walking;Altered mental status  PT Problem List: Decreased activity tolerance;Decreased balance;Decreased mobility;Decreased cognition;Decreased knowledge of use of DME PT Treatment Interventions: DME instruction;Gait training;Functional mobility training;Therapeutic activities;Therapeutic exercise;Balance training;Cognitive remediation;Patient/family education   PT Goals Acute Rehab PT Goals PT Goal Formulation: With patient Time For Goal Achievement: 12/20/11 Potential to Achieve Goals: Good Pt will go Supine/Side to Sit: with modified independence PT Goal: Supine/Side to Sit - Progress: Goal set today Pt will go Sit to Supine/Side: with modified independence PT Goal: Sit to Supine/Side - Progress: Goal set today Pt will go Sit to Stand: with modified independence;with upper extremity assist PT Goal: Sit to Stand - Progress: Goal set today Pt will Ambulate: 51 - 150 feet;with supervision;with least restrictive assistive device PT Goal: Ambulate - Progress: Goal set today  Visit Information  Last PT Received On: 12/06/11 Assistance Needed: +1    Subjective Data  Subjective: "I used to play basketball." Pt very slow to answer questions  Patient Stated Goal: Wants to return to daughter's home, yet realizes he may be too weak right now.   Prior Functioning  Home Living Lives With: Daughter;Other (Comment) (and son-in-law) Type of Home: House Home Access:  (? reliability; pt unable to  describe rest of home envirnmnt )  Cognition  Overall Cognitive Status: No family/caregiver present to determine baseline cognitive functioning Area of Impairment: Attention;Memory;Following commands Arousal/Alertness: Lethargic (easily aroused and awake thru session, but slow/flat) Orientation Level: Place;Time Behavior During Session: Flat affect Current Attention Level: Sustained Attention - Other Comments: loses train of thought easily; forgets the question he is trying to answer Memory Deficits: could not recall home environment Following Commands: Follows one step commands inconsistently Cognition - Other Comments: requires repetition of commands ~40 % of time    Extremity/Trunk Assessment Right Lower Extremity Assessment RLE ROM/Strength/Tone: WFL for tasks assessed Left Lower Extremity Assessment LLE ROM/Strength/Tone: WFL for tasks assessed Trunk Assessment Trunk Assessment: Kyphotic   Balance Static Sitting Balance Static Sitting - Balance Support: No upper extremity supported;Feet supported Static Sitting - Level of Assistance: 5: Stand by assistance Static Standing Balance Static Standing - Balance Support: Bilateral upper extremity supported Static Standing - Level of Assistance: 4: Min assist  End of Session PT - End of Session Equipment Utilized During Treatment: Gait belt Activity Tolerance: Patient limited by fatigue Patient left: in chair;with call bell/phone within reach Nurse Communication: Mobility status   Jose Crawford 12/06/2011, 12:47 PM  Pager [phone]

## 2011-12-06 NOTE — Progress Notes (Signed)
[facility] [place] TEAM 1 - Stepdown/ICU TEAM  PCP:  Pamelia Hoit, MD, MD  Subjective: 78 y/o with h/o prostate cancer presenting with abd pain (no vomiting) and found to have acute cholecystitis.  He was admitted for surgery.  Unfortunately, he was found to have a large mass in left chest. Therefore, a cholecystectomy was not done. A cholecystostomy tube was placed by IR on 5/10. He was transferred over to the hospitalist service on 5/10.   The pt is resting comfortably in a bedside chair.  He denies f/c, sob, n/v, or abdom pain.  He is, however, a bit confused.  Though he is alert, he is not able to provide a detailed hx, and he does not fully grasp his current clinical situation.    Objective:  Intake/Output Summary (Last 24 hours) at 12/06/11 1549 Last data filed at 12/06/11 1400  Gross per 24 hour  Intake    360 ml  Output   1885 ml  Net  -1525 ml   Blood pressure 128/73, pulse 82, temperature 97.5 F (36.4 C), temperature source Oral, resp. rate 16, height 6\' 1"  (1.854 m), weight 78.7 kg (173 lb 8 oz), SpO2 95.00%.  Physical Exam: General: No acute respiratory distress at rest - alert but confused Lungs: mild bibasilar crackles - no wheeze Cardiovascular: Regular rate and rhythm without murmur gallop or rub normal S1 and S2 Abdomen: Nontender, nondistended, soft, bowel sounds positive, no rebound, no ascites, no appreciable mass - GB drain intact in RUQ draining dark billious fluid Extremities: No significant cyanosis, clubbing, or edema bilateral lower extremities  Lab Results:  Basename 12/06/11 0359 12/05/11 1000 12/04/11 0422  NA 131* 135 133*  K 3.9 4.0 4.0  CL 100 104 103  CO2 23 21 23   GLUCOSE 94 138* 109*  BUN 12 10 11   CREATININE 0.93 0.86 1.01  CALCIUM 11.4* 11.3* 10.7*  MG -- -- --  PHOS -- -- --    Basename 12/04/11 0422  AST 22  ALT 13  ALKPHOS 85  BILITOT 0.8  PROT 5.6*  ALBUMIN 2.7*    Basename 12/06/11 0359 12/05/11 1000  12/04/11 0422  WBC 8.0 6.7 10.9*  NEUTROABS -- -- 8.7*  HGB 12.7* 12.6* 11.5*  HCT 38.6* 37.8* 34.3*  MCV 92.8 92.2 92.5  PLT 299 256 195   Micro Results: Recent Results (from the past 240 hour(s))  MRSA PCR SCREENING     Status: Normal   Collection Time   12/02/11 11:36 PM      Component Value Range Status Comment   MRSA by PCR NEGATIVE  NEGATIVE  Final   BODY FLUID CULTURE     Status: Normal   Collection Time   12/03/11  1:58 PM      Component Value Range Status Comment   Specimen Description FLUID GALL BLADDER   Final    Special Requests NONE   Final    Gram Stain     Final    Value: NO WBC SEEN     MODERATE GRAM POSITIVE COCCI IN PAIRS     IN CLUSTERS   Culture ABUNDANT GROUP B STREP(S.AGALACTIAE)ISOLATED   Final    Report Status 12/06/2011 FINAL   Final    Organism ID, Bacteria GROUP B STREP(S.AGALACTIAE)ISOLATED   Final     Studies/Results: All recent x-ray/radiology reports have been reviewed in detail.   Medications: I have reviewed the patient's complete medication list.  Assessment/Plan:  Acute calculous cholecystitis with sepsis  S/p  cholecystostomy tube - Group B strep(agalactiae) growing from bile - on augmentin for coverage - tolerating low fat diet - to f/u w/ Dr. Gaynelle Adu as outpt - plan is for drain to remain in place for at least 6 weeks   Lung mass  Appreciate CVTS consult - IR to carry out bx on Tuesday  History of Prostate cancer PSA has been checked and is quite low   Nephrolithiasis without hydronephrosis  No acute complications at this time   Dementia? His daughter is his POA - he lives with her - family suggests that pt is usually "confused" at baseline - I suspect he has dementia that has not yet been formally diagnosed - I will check B12 and folate as a precaution/screening measure  Systolic and diastolic CHF- chronic- ECHO 12/04/11  EF 45-50% and gr 1 diastolic dysf - appears euvolemic at the present time   Mild Hypercalcemia  PTH  elevated, as is ionized calcium (c/w primary hyperparathyroidism), though one would expect PTH to be low in this setting - will need to follow Ca trend - if it does not correct with ongoing hydration, we will need to consider investigating further (hypercalcemia of malignancy and concomitant primary hyperparathyroidism vs/ PTH secreting tumor)  Disposition  Continue efforts to mobilize - transfer to medical bed - for transcutaneous bx of lung/mediastianal mass in the am  Lonia Blood, MD [facility] Office  [phone] Pager [phone]  On-Call/Text Page:      [email or web address]      password [facility]

## 2011-12-07 ENCOUNTER — Inpatient Hospital Stay (HOSPITAL_COMMUNITY): Payer: Medicare Other

## 2011-12-07 DIAGNOSIS — K810 Acute cholecystitis: Secondary | ICD-10-CM

## 2011-12-07 DIAGNOSIS — C61 Malignant neoplasm of prostate: Secondary | ICD-10-CM

## 2011-12-07 DIAGNOSIS — A413 Sepsis due to Hemophilus influenzae: Secondary | ICD-10-CM

## 2011-12-07 DIAGNOSIS — R222 Localized swelling, mass and lump, trunk: Secondary | ICD-10-CM

## 2011-12-07 LAB — CBC
MCH: 30.6 pg (ref 26.0–34.0)
MCV: 90.7 fL (ref 78.0–100.0)
Platelets: 335 10*3/uL (ref 150–400)
RBC: 4.71 MIL/uL (ref 4.22–5.81)
RDW: 13.1 % (ref 11.5–15.5)
WBC: 11 10*3/uL — ABNORMAL HIGH (ref 4.0–10.5)

## 2011-12-07 LAB — BASIC METABOLIC PANEL
CO2: 25 mEq/L (ref 19–32)
Calcium: 12.1 mg/dL — ABNORMAL HIGH (ref 8.4–10.5)
Creatinine, Ser: 0.91 mg/dL (ref 0.50–1.35)
GFR calc non Af Amer: 76 mL/min — ABNORMAL LOW (ref >90)
Glucose, Bld: 100 mg/dL — ABNORMAL HIGH (ref 70–99)
Sodium: 136 mEq/L (ref 135–145)

## 2011-12-07 LAB — VITAMIN B12: Vitamin B-12: 313 pg/mL (ref 211–911)

## 2011-12-07 MED ORDER — MIDAZOLAM HCL 2 MG/2ML IJ SOLN
INTRAMUSCULAR | Status: AC
  Filled 2011-12-07: qty 4

## 2011-12-07 MED ORDER — MIDAZOLAM HCL 5 MG/5ML IJ SOLN
INTRAMUSCULAR | Status: AC | PRN
  Administered 2011-12-07: 1 mg via INTRAVENOUS

## 2011-12-07 MED ORDER — NYSTATIN 100000 UNIT/GM EX CREA
TOPICAL_CREAM | Freq: Two times a day (BID) | CUTANEOUS | Status: DC
  Administered 2011-12-07 – 2011-12-09 (×5): via TOPICAL
  Filled 2011-12-07: qty 15

## 2011-12-07 MED ORDER — SODIUM CHLORIDE 0.9 % IV SOLN
60.0000 mg | Freq: Once | INTRAVENOUS | Status: AC
  Administered 2011-12-07: 60 mg via INTRAVENOUS
  Filled 2011-12-07: qty 500

## 2011-12-07 MED ORDER — FENTANYL CITRATE 0.05 MG/ML IJ SOLN
INTRAMUSCULAR | Status: AC
  Filled 2011-12-07: qty 4

## 2011-12-07 MED ORDER — FENTANYL CITRATE 0.05 MG/ML IJ SOLN
INTRAMUSCULAR | Status: AC | PRN
  Administered 2011-12-07: 25 ug via INTRAVENOUS

## 2011-12-07 NOTE — Progress Notes (Addendum)
Patient ID: Jose Crawford, male   DOB: Sep 10, 1925, 78 y.o.   MRN: [id number]  [facility] PCP:  Pamelia Hoit, MD, MD  Interim history: 78 y/o with h/o prostate cancer presenting with abd pain (no vomiting) and found to have acute cholecystitis.  He was admitted for surgery.  Unfortunately, he was found to have a large mass in left chest. Therefore, a cholecystectomy was not done. A cholecystostomy tube was placed by IR on 5/10. He was transferred over to the hospitalist service on 5/10.  On 12/07/11 he underwent CT guided biopsy of the lung mass.  Subjective: Lying in bed.  Complains of irritation around his scrotum.  Objective:  Intake/Output Summary (Last 24 hours) at 12/07/11 1317 Last data filed at 12/07/11 0700  Gross per 24 hour  Intake      0 ml  Output    526 ml  Net   -526 ml   Blood pressure 120/65, pulse 73, temperature 98.7 F (37.1 C), temperature source Axillary, resp. rate 16, height 6\' 1"  (1.854 m), weight 79.878 kg (176 lb 1.6 oz), SpO2 98.00%.  Physical Exam: General: A&O, Lying in bed, moves and speaks somewhat slowly. Lungs: CTA no W/C/R Cardiovascular: Regular rate and rhythm without murmur gallop or rub normal S1 and S2 Abdomen: Nontender, nondistended, soft, bowel sounds positive, no rebound, no ascites, no appreciable mass - GB drain intact in RUQ draining dark billious fluid.   Extremities: No significant cyanosis, clubbing, or edema bilateral lower extremities Skin:  Patient's inguinal area bilaterally appears bright pink and damp (no papules).  Top of his head reveals an approximately 1 cm annular lesion with scale.    Lab Results:  Basename 12/07/11 8295 12/06/11 0359 12/05/11 1000  NA 136 131* 135  K 4.3 3.9 4.0  CL 102 100 104  CO2 25 23 21   GLUCOSE 100* 94 138*  BUN 17 12 10   CREATININE 0.91 0.93 0.86  CALCIUM 12.1* 11.4* 11.3*  MG -- -- --  PHOS -- -- --    Basename 12/07/11 0619 12/06/11 0359 12/05/11 1000  WBC 11.0* 8.0 6.7    NEUTROABS -- -- --  HGB 14.4 12.7* 12.6*  HCT 42.7 38.6* 37.8*  MCV 90.7 92.8 92.2  PLT 335 299 256   Micro Results: Recent Results (from the past 240 hour(s))  MRSA PCR SCREENING     Status: Normal   Collection Time   12/02/11 11:36 PM      Component Value Range Status Comment   MRSA by PCR NEGATIVE  NEGATIVE  Final   BODY FLUID CULTURE     Status: Normal   Collection Time   12/03/11  1:58 PM      Component Value Range Status Comment   Specimen Description FLUID GALL BLADDER   Final    Special Requests NONE   Final    Gram Stain     Final    Value: NO WBC SEEN     MODERATE GRAM POSITIVE COCCI IN PAIRS     IN CLUSTERS   Culture ABUNDANT GROUP B STREP(S.AGALACTIAE)ISOLATED   Final    Report Status 12/06/2011 FINAL   Final    Organism ID, Bacteria GROUP B STREP(S.AGALACTIAE)ISOLATED   Final     Studies/Results: All recent x-ray/radiology reports have been reviewed in detail.   Medications: I have reviewed the patient's complete medication list.  Assessment/Plan:  Lung mass  Appreciate CVTS consult  Biopsy completed on 12/07/2011 by IR Dr. Asa Lente office has been notified  of the patient and will call him with an appointment  Acute calculous cholecystitis with sepsis  S/p cholecystostomy tube - Group B strep(agalactiae) growing from bile - on augmentin for coverage - tolerating low fat diet - to f/u w/ Dr. Gaynelle Adu as outpt - plan is for drain to remain in place for at least 6 weeks   Hypercalcemia - corrected calcium approx 13 PTH elevated, as is ionized calcium (c/w primary hyperparathyroidism), though one would expect PTH to be low in this setting.  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  Patient calcium high despite IV fluids.  Will give one dose of Pamidronate IV.  History of Prostate cancer PSA has been checked and is quite low   Nephrolithiasis without hydronephrosis  No acute complications at this time   Dementia? His daughter is his POA -  he lives with her - family suggests that pt is usually "confused" at baseline - I suspect he has dementia that has not yet been formally diagnosed - B12 and folate are pending.  TSH not checked in the acute setting.    Systolic and diastolic CHF- chronic- ECHO 12/04/11  EF 45-50% and gr 1 diastolic dysf - appears euvolemic at the present time   Yeast in inguinal area Nystatin cream to affected area  Lesion on top of head (possibly another Basal cell) Will need out patient dermatological follow up and removal.  Disposition  PT recommends SNF.  The patient previously lived with daughter and son-in-law.  SW investigating to determine whether or not they can take him home or does he need placement.  Patient is currently a +1 assist.  Code Status:  DNR/DNI  Romualdo Bolk [facility] Pager: [phone] 2:00 pm 12/07/11

## 2011-12-07 NOTE — Progress Notes (Signed)
Percutaneous drain.  Outpatient follow-up  Marta Lamas. Gae Bon, MD, FACS [phone] [facility]

## 2011-12-07 NOTE — Procedures (Signed)
Procedure:  CT guided core biopsy of left mediastinal mass Findings:  Solid core tissue obtained.  18 G core biopsy x 7 via 17 G needle.  Sent in formalin and saline. No pneumothorax or hemorrhage on immediate post CT imaging.

## 2011-12-07 NOTE — Progress Notes (Signed)
Patient ID: Jose Crawford, male   DOB: 1925-08-26, 78 y.o.   MRN: [id number]    Subjective: Pt seems a bit confused.  C/o pain at his buttock.  No abdominal pain.  Tolerating diet.  Objective: Vital signs in last 24 hours: Temp:  [97.2 F (36.2 C)-98.8 F (37.1 C)] 98.7 F (37.1 C) (05/14 0522) Pulse Rate:  [71-86] 80  (05/14 0522) Resp:  [15-18] 18  (05/14 0522) BP: (116-139)/(71-84) 120/80 mmHg (05/14 0522) SpO2:  [81 %-97 %] 97 % (05/14 0522) Weight:  [176 lb 1.6 oz (79.878 kg)] 176 lb 1.6 oz (79.878 kg) (05/14 0522) Last BM Date: 12/05/11  Intake/Output from previous day: 05/13 0701 - 05/14 0700 In: 370 [P.O.:370] Out: 1166 [Urine:1050; Drains:116] Intake/Output this shift:    PE: Abd: soft, NT, ND, +BS, perc chole drain with some bilious output.  Lab Results:   Basename 12/07/11 0619 12/06/11 0359  WBC 11.0* 8.0  HGB 14.4 12.7*  HCT 42.7 38.6*  PLT 335 299   BMET  Basename 12/07/11 0619 12/06/11 0359  NA 136 131*  K 4.3 3.9  CL 102 100  CO2 25 23  GLUCOSE 100* 94  BUN 17 12  CREATININE 0.91 0.93  CALCIUM 12.1* 11.4*   PT/INR No results found for this basename: LABPROT:2,INR:2 in the last 72 hours CMP     Component Value Date/Time   NA 136 12/07/2011 0619   K 4.3 12/07/2011 0619   CL 102 12/07/2011 0619   CO2 25 12/07/2011 0619   GLUCOSE 100* 12/07/2011 0619   BUN 17 12/07/2011 0619   CREATININE 0.91 12/07/2011 0619   CALCIUM 12.1* 12/07/2011 0619   PROT 5.6* 12/04/2011 0422   ALBUMIN 2.7* 12/04/2011 0422   AST 22 12/04/2011 0422   ALT 13 12/04/2011 0422   ALKPHOS 85 12/04/2011 0422   BILITOT 0.8 12/04/2011 0422   GFRNONAA 76* 12/07/2011 0619   GFRAA 88* 12/07/2011 0619   Lipase     Component Value Date/Time   LIPASE 13 12/02/2011 1300       Studies/Results: No results found.  Anti-infectives: Anti-infectives     Start     Dose/Rate Route Frequency Ordered Stop   12/05/11 2200   amoxicillin-clavulanate (AUGMENTIN) 875-125 MG per tablet 1 tablet        1 tablet Oral 2 times daily 12/05/11 1748     12/02/11 2200   piperacillin-tazobactam (ZOSYN) IVPB 3.375 g  Status:  Discontinued        3.375 g 12.5 mL/hr over 240 Minutes Intravenous 3 times per day 12/02/11 2036 12/05/11 1748   12/02/11 1545  piperacillin-tazobactam (ZOSYN) IVPB 3.375 g       3.375 g 12.5 mL/hr over 240 Minutes Intravenous  Once 12/02/11 1543 12/02/11 2102           Assessment/Plan  1. Acute cholecystitis, s/p perc drain 2. Lung mass  Plan: 1. Cont with perc chole drain.  His abdomen is stable.  He will need follow up with Dr. Andrey Campanile in 4 weeks to follow up on his drain.  LOS: 5 days    Arvel Oquinn E 12/07/2011

## 2011-12-07 NOTE — Discharge Instructions (Signed)
Cholecystostomy  The gallbladder is a pear-shaped organ that lies beneath the liver on the right side of the body. The gallbladder stores bile, a fluid that helps the body digest fats. However, sometimes bile and other fluids build up in the gallbladder because of an obstruction (for example, a gallstones). This can cause fever, pain, swelling, nausea and other serious symptoms. The procedure used to drain these fluids is called a cholecystostomy. A tube is inserted into the gallbladder. Fluid drains through the tube into a plastic bag outside the body. This procedure is usually done on people who are admitted to the hospital. The procedure is often recommended for people who cannot have gallbladder surgery right away, usually because they are too ill to make it through surgery. The cholecystostomy tube is usually temporary, until surgery can be done. RISKS AND COMPLICATIONS Although rare, complications can include:  Clogging of the tube.   Infection in or around the drain site. Antibiotics might be prescribed for the infection. Or, another tube might be inserted to drain the infected fluid.   Internal bleeding from the liver.  BEFORE THE PROCEDURE   Try to quit smoking several weeks before the procedure. Smoking can slow healing.   Arrange for someone to drive you home from the hospital.   Right before your procedure, avoid all foods and liquids after midnight. This includes coffee, tea and water.   On the day of the procedure, arrive early to fill out all the paperwork.  PROCEDURE You will be given a sedative to make you sleepy and a local anesthetic to numb the skin. Next, a small cut is made in the abdomen. Then a tube is threaded through the cut into the gallbladder. The procedure is usually done with ultrasound to guide the tube into the gallbladder. Once the tube is in place, the drain is secured to the skin with a stitch. The tube is then connected to a drainage bag.  AFTER THE  PROCEDURE   People who have a cholecystostomy usually stay in the hospital for several days because they are so ill. You might not be able to eat for the first few days. Instead, you will be connected to an IV for fluids and nutrients.   The procedure does not cure the blockage that caused the fluid to build up in the first place. Because of this, the gallbladder will need to be removed in the future. The drain is removed at that time.  HOME CARE INSTRUCTIONS  Be sure to follow your healthcare provider's instructions carefully. You may shower but avoid tub baths and swimming until your caregiver says it is OK. Eat and drink according to the directions you have been given. And be sure to make all follow-up appointments.  Call your healthcare provider if you notice new pain, redness or swelling around the wound. SEEK IMMEDIATE MEDICAL CARE IF:   There is increased abdominal pain.   Nausea or vomiting occurs.   You develop a fever.   The drainage tube comes out of the abdomen.  Document Released: 10/08/2008 Document Revised: 07/01/2011 Document Reviewed: 10/08/2008 Exit[employer] Patient Information 2012 [employer]. 

## 2011-12-07 NOTE — Clinical Social Work Psychosocial (Signed)
     Clinical Social Work Department BRIEF PSYCHOSOCIAL ASSESSMENT 12/07/2011  Patient:  Jose Crawford, Jose Crawford     Account Number:  [id number]     Admit date:  12/02/2011  Clinical Social Worker:  Jacelyn Grip  Date/Time:  12/07/2011 02:23 PM  Referred by:  Physician  Date Referred:  12/07/2011 Referred for  SNF Placement   Other Referral:   Interview type:  Family Other interview type:    PSYCHOSOCIAL DATA Living Status:  FAMILY Admitted from facility:   Level of care:   Primary support name:  [name] Harbell/[phone] Primary support relationship to patient:  CHILD, ADULT Degree of support available:   strong    CURRENT CONCERNS Current Concerns  Post-Acute Placement   Other Concerns:    SOCIAL WORK ASSESSMENT / PLAN CSW spoke with pt daughter via telephone to discuss disposition planning. CSW discussed with pt daughter PT recommendation of ST SNF vs pt returning home with home health services and 24 hour supervision. Pt daughter discussed that pt lives with her and that she works out of her home and is able to provide pt with 24 hour assistance and is agreeable to home health services. Pt daughter reports that pt has been living with her for 4 years and feels that pt returning home is the most appropriate thing for pt at discharge. CSW notified MD and RNCM. RNCM to assist with arranging home health services. CSW will continue to follow as pt may need assistance with ambulance transportation at discharge   Assessment/plan status:  Psychosocial Support/Ongoing Assessment of Needs Other assessment/ plan:   discharge planning   Information/referral to community resources:   Referral to [facility] for home health needs    PATIENTS/FAMILYS RESPONSE TO PLAN OF CARE: Per PA-C, pt alert and oriented x 1. Pt daughter is supportive and actively involved in patient care. CSW provided emotional support as pt daughter discussed that pt and pt family are awaiting results from pt  biopsy today. Pt daughter eager to have resources for pt to remain at home in her care.

## 2011-12-07 NOTE — Progress Notes (Signed)
Physical Therapy Treatment Patient Details Name: Jose Crawford MRN: [id number] DOB: 1926-03-03 Today's Date: 12/07/2011 Time: 4098-1191 PT Time Calculation (min): 19 min  PT Assessment / Plan / Recommendation Comments on Treatment Session  Pt adm with cholecystitis and chest mass.  Pt appears fatigued today after biopsy.  Family to take pt home and provide 24 hour assist.    Follow Up Recommendations  Home health PT;Supervision/Assistance - 24 hour    Barriers to Discharge        Equipment Recommendations  Rolling walker with 5" wheels;Wheelchair (measurements);Hospital bed    Recommendations for Other Services    Frequency Min 3X/week   Plan Frequency remains appropriate;Discharge plan needs to be updated    Precautions / Restrictions Precautions Precautions: Fall   Pertinent Vitals/Pain VSS    Mobility  Bed Mobility Bed Mobility: Supine to Sit;Sitting - Scoot to Edge of Bed;Sit to Supine Supine to Sit: 4: Min assist;HOB flat Sitting - Scoot to Edge of Bed: 4: Min assist Sit to Supine: 4: Min assist;HOB flat Details for Bed Mobility Assistance: Cues to complete task.  needed help to bring trunk up into sitting. Transfers Sit to Stand: 4: Min assist;With upper extremity assist;From bed Stand to Sit: To bed;4: Min assist;With upper extremity assist Details for Transfer Assistance: Needs assist to bring hips up.    Exercises     PT Diagnosis:    PT Problem List:   PT Treatment Interventions:     PT Goals Acute Rehab PT Goals PT Goal: Supine/Side to Sit - Progress: Progressing toward goal PT Goal: Sit to Supine/Side - Progress: Progressing toward goal PT Goal: Sit to Stand - Progress: Not progressing PT Goal: Ambulate - Progress: Not progressing  Visit Information  Last PT Received On: 12/07/11 Assistance Needed: +1    Subjective Data  Subjective: Pt states he is tired.   Cognition  Overall Cognitive Status: No family/caregiver present to determine baseline  cognitive functioning Area of Impairment: Attention;Memory;Following commands Arousal/Alertness: Awake/alert Behavior During Session: Flat affect Current Attention Level: Sustained Attention - Other Comments: Needs verbal cues to complete task. Memory Deficits: Unable to remember how many children he has. Following Commands: Follows one step commands inconsistently Cognition - Other Comments: Requires repetition of commands.    Balance  Static Sitting Balance Static Sitting - Balance Support: Bilateral upper extremity supported Static Sitting - Level of Assistance: 4: Min assist (pt with lean to lt) Static Standing Balance Static Standing - Balance Support: Bilateral upper extremity supported (on walker) Static Standing - Level of Assistance: 4: Min assist (pt leaning left)  End of Session PT - End of Session Equipment Utilized During Treatment: Gait belt Activity Tolerance: Patient limited by fatigue (pt had biopsy earlier) Patient left: in bed;with call bell/phone within reach;with bed alarm set Nurse Communication: Mobility status    Jose Crawford 12/07/2011, 3:28 PM  [employer] PT [phone]

## 2011-12-08 ENCOUNTER — Telehealth: Payer: Self-pay | Admitting: *Deleted

## 2011-12-08 DIAGNOSIS — A413 Sepsis due to Hemophilus influenzae: Secondary | ICD-10-CM

## 2011-12-08 DIAGNOSIS — R222 Localized swelling, mass and lump, trunk: Secondary | ICD-10-CM

## 2011-12-08 DIAGNOSIS — K810 Acute cholecystitis: Secondary | ICD-10-CM

## 2011-12-08 DIAGNOSIS — C61 Malignant neoplasm of prostate: Secondary | ICD-10-CM

## 2011-12-08 LAB — CBC
HCT: 40.5 % (ref 39.0–52.0)
Hemoglobin: 13.7 g/dL (ref 13.0–17.0)
MCHC: 33.8 g/dL (ref 30.0–36.0)
RBC: 4.48 MIL/uL (ref 4.22–5.81)

## 2011-12-08 LAB — BASIC METABOLIC PANEL
BUN: 17 mg/dL (ref 6–23)
Chloride: 100 mEq/L (ref 96–112)
GFR calc Af Amer: 89 mL/min — ABNORMAL LOW (ref >90)
Glucose, Bld: 99 mg/dL (ref 70–99)
Potassium: 3.9 mEq/L (ref 3.5–5.1)
Sodium: 134 mEq/L — ABNORMAL LOW (ref 135–145)

## 2011-12-08 MED ORDER — IBUPROFEN 800 MG PO TABS
800.0000 mg | ORAL_TABLET | Freq: Four times a day (QID) | ORAL | Status: DC | PRN
  Administered 2011-12-08: 800 mg via ORAL
  Filled 2011-12-08 (×3): qty 1

## 2011-12-08 MED ORDER — NYSTATIN 100000 UNIT/GM EX CREA: TOPICAL_CREAM | Freq: Two times a day (BID) | CUTANEOUS | Status: DC

## 2011-12-08 MED ORDER — AMOXICILLIN-POT CLAVULANATE 875-125 MG PO TABS: 1.0000 | ORAL_TABLET | Freq: Two times a day (BID) | ORAL | Status: AC

## 2011-12-08 MED ORDER — IBUPROFEN 800 MG PO TABS: 800.0000 mg | ORAL_TABLET | Freq: Four times a day (QID) | ORAL | Status: AC | PRN

## 2011-12-08 NOTE — Progress Notes (Signed)
Clinical Social Worker notified by [facility] that pt anticipated for discharge tomorrow and will need ambulance transport to home at discharge. Clinical Social Worker to arrange ambulance transportation for when pt medically stable for discharge.   Jacklynn Lewis, MSW, LCSWA  Clinical Social Work [phone]

## 2011-12-08 NOTE — Telephone Encounter (Signed)
Rec'd call from Dr Lindie Spruce for patient to have outpatient referral for lung/chest mass. Patient still INP, [id number]. Patient asked to speak to daughter, Hurley Cisco, [place], [phone] with appt information. Informed [name] to please call us back if for some reason patient does not get discharged before his appt here at [facility] with Dr Arbutus Ped. [name] voiced understanding and will await to here from our scheduling team.

## 2011-12-08 NOTE — Progress Notes (Signed)
Physical Therapy Treatment Patient Details Name: Jose Crawford MRN: [id number] DOB: 09-12-1925 Today's Date: 12/08/2011 Time: 4098-1191 PT Time Calculation (min): 21 min  PT Assessment / Plan / Recommendation Comments on Treatment Session  Pt adm with cholecystitis and chest mass.  Mobility limited by mentation and need for consistent redirection, along with weakness, decr. actiivity tolerance and decr balance.    Follow Up Recommendations  Home health PT;Supervision/Assistance - 24 hour    Barriers to Discharge        Equipment Recommendations  Rolling walker with 5" wheels;Wheelchair (measurements);Hospital bed    Recommendations for Other Services    Frequency     Plan Frequency remains appropriate;Discharge plan needs to be updated    Precautions / Restrictions Precautions Precautions: Fall Restrictions Weight Bearing Restrictions: No   Pertinent Vitals/Pain     Mobility  Bed Mobility Bed Mobility: Sitting - Scoot to Edge of Bed;Sit to Supine Sitting - Scoot to Edge of Bed: 4: Min guard Sit to Supine: 4: Min guard;HOB flat Details for Bed Mobility Assistance: vc's for redirection to task Transfers Transfers: Sit to Stand;Stand to Sit Sit to Stand: 4: Min guard;From chair/3-in-1;With upper extremity assist Stand to Sit: With upper extremity assist;4: Min guard;To bed Details for Transfer Assistance: vc's for hand placement and safety; Ambulation/Gait Ambulation/Gait Assistance: 4: Min guard;4: Min Environmental consultant (Feet): 130 Feet Assistive device: Rolling walker Ambulation/Gait Assistance Details: generally flexed posture unless cued, short, halting steps, variable use of RW Gait Pattern: Decreased stride length;Trunk flexed;Shuffle Stairs: No    Exercises     PT Diagnosis:    PT Problem List:   PT Treatment Interventions:     PT Goals Acute Rehab PT Goals PT Goal Formulation: With patient Time For Goal Achievement: 12/20/11 Potential to Achieve  Goals: Good PT Goal: Supine/Side to Sit - Progress: Progressing toward goal PT Goal: Sit to Supine/Side - Progress: Progressing toward goal PT Goal: Sit to Stand - Progress: Progressing toward goal PT Goal: Ambulate - Progress: Progressing toward goal  Visit Information  Last PT Received On: 12/08/11 Assistance Needed: +1    Subjective Data  Subjective: Shouldn't we call my wife... oh.. that was a sad day in my family Patient Stated Goal: Wants to return to daughter's home, yet realizes he may be too weak right now.   Cognition  Overall Cognitive Status: Impaired Arousal/Alertness: Awake/alert Orientation Level: Place;Time Behavior During Session: [facility] for tasks performed Current Attention Level: Sustained Cognition - Other Comments: Requires repetition of commands.    Balance  Balance Balance Assessed: No  End of Session PT - End of Session Activity Tolerance: Patient tolerated treatment well Patient left: in bed;with call bell/phone within reach;with bed alarm set Nurse Communication: Mobility status    Danett Palazzo, Eliseo Gum 12/08/2011, 4:51 PM  12/08/2011  [place], PT [phone] (pager)

## 2011-12-08 NOTE — Progress Notes (Signed)
Patient ID: Jose Crawford, male   DOB: [birth year], 78 y.o.   MRN: [id number]  [facility] PCP:  Jose Hoit, MD, MD  Interim history: 78 y/o with h/o prostate cancer presenting with abd pain (no vomiting) and found to have acute cholecystitis.  He was admitted for surgery.  Unfortunately, he was found to have a large mass in left chest. Therefore, a cholecystectomy was not done. A cholecystostomy tube was placed by IR on 5/10. He was transferred over to the hospitalist service on 5/10.  On 12/07/11 he underwent CT guided biopsy of the 9 cm lung mass.  Subjective: No complaints.    Objective:  Intake/Output Summary (Last 24 hours) at 12/08/11 1023 Last data filed at 12/08/11 0648  Gross per 24 hour  Intake    200 ml  Output    980 ml  Net   -780 ml   Blood pressure 120/77, pulse 59, temperature 98 F (36.7 C), temperature source Oral, resp. rate 17, height 6\' 1"  (1.854 m), weight 79.878 kg (176 lb 1.6 oz), SpO2 92.00%.  Physical Exam: General: A&O, Lying in bed, moves and speaks more slowly today. Lungs: CTA no W/C/R Cardiovascular: Regular rate and rhythm without murmur gallop or rub normal S1 and S2 Abdomen: Nontender, nondistended, soft, bowel sounds positive, no rebound, no ascites, no appreciable mass - GB drain intact in RUQ draining small amount of brown fluid. Extremities: No significant cyanosis, clubbing, or edema bilateral lower extremities Skin:  Patient's inguinal area bilaterally appears less pink and damp (no papules).  Top of his head reveals an approximately 1 cm annular lesion with scale.   Neuro:  Slow to respond.  Unable to make simple decisions (would you like tomato or vegetable soup for lunch?).  Otherwise non-focal.  Lab Results:  Basename 12/08/11 0535 12/07/11 0619 12/06/11 0359  NA 134* 136 131*  K 3.9 4.3 3.9  CL 100 102 100  CO2 23 25 23   GLUCOSE 99 100* 94  BUN 17 17 12   CREATININE 0.87 0.91 0.93  CALCIUM 11.6* 12.1* 11.4*  MG -- -- --    PHOS -- -- --    Basename 12/08/11 0535 12/07/11 0619 12/06/11 0359  WBC 13.7* 11.0* 8.0  NEUTROABS -- -- --  HGB 13.7 14.4 12.7*  HCT 40.5 42.7 38.6*  MCV 90.4 90.7 92.8  PLT 362 335 299   Micro Results: Recent Results (from the past 240 hour(s))  MRSA PCR SCREENING     Status: Normal   Collection Time   12/02/11 11:36 PM      Component Value Range Status Comment   MRSA by PCR NEGATIVE  NEGATIVE  Final   BODY FLUID CULTURE     Status: Normal   Collection Time   12/03/11  1:58 PM      Component Value Range Status Comment   Specimen Description FLUID GALL BLADDER   Final    Special Requests NONE   Final    Gram Stain     Final    Value: NO WBC SEEN     MODERATE GRAM POSITIVE COCCI IN PAIRS     IN CLUSTERS   Culture ABUNDANT GROUP B STREP(S.AGALACTIAE)ISOLATED   Final    Report Status 12/06/2011 FINAL   Final    Organism ID, Bacteria GROUP B STREP(S.AGALACTIAE)ISOLATED   Final     Studies/Results: All recent x-ray/radiology reports have been reviewed in detail.   Medications: I have reviewed the patient's complete medication list.  Assessment/Plan:  Lung  mass  Appreciate CVTS consult  Biopsy completed on 12/07/2011 by IR Dr. Asa Crawford office has been notified of the patient and will call him with an appointment  Acute calculous cholecystitis with sepsis  S/p cholecystostomy tube - Group B strep(agalactiae) growing from bile - on augmentin for coverage - tolerating low fat diet - to f/u w/ Dr. Gaynelle Crawford as outpt - plan is for drain to remain in place for at least 6 weeks.  Bilious drainage is decreasing.  Hypercalcemia - corrected calcium approx 13 PTH elevated, as is ionized calcium (c/w primary hyperparathyroidism), though one would expect PTH to be low in this setting.  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  Patient calcium high despite IV fluids.  Gave one dose of Pamidronate IV on 12/07/11.  Will need calcium / albumin checked out patient.  History  of Prostate cancer PSA has been checked and is quite low   Nephrolithiasis without hydronephrosis  No acute complications at this time   Dementia? His daughter is his POA - he lives with her - family suggests that pt is usually "confused" at baseline - I suspect he has dementia that has not yet been formally diagnosed - B12 and folate are pending.  TSH not checked in the acute setting.  Will discontinue morphine 5/15 as I believe it is contributing to his slowed presentation.  Will substitute advil.  Systolic and diastolic CHF- chronic- ECHO 12/04/11  EF 45-50% and gr 1 diastolic dysf - appears euvolemic at the present time   Yeast in inguinal area Nystatin cream to affected area  Lesion on top of head (possibly another Basal cell) Will need out patient dermatological follow up and removal.  Disposition  Daughter planning to take care of him at home if at all possible.  Will order home health with RN, Aide, and Child psychotherapist.  Patient may indeed end up in SNF within 30 days if family is unable to properly care for him.    Daughter has a hair salon that she runs in a building in the  from home.  She needs for the ambulance to have him at her house before noon 5/16 if at all possible.  Her 1st business appointment is at 2:00 pm and she needs to settle him in before that.  DME Rolling walker, Wheel Chair and Hospital bed ordered for home.  Mr. Jose Crawford suffers from recent sepsis and almost certain bronchogenic carcinoma (biopsy pending).  He has trouble breathing at night when head is elevated less than 30 degrees.  Bed wedges do not provide enough elevation to resolve breathing issues.  Hypoxia and pain cause patient to require frequent changes in body position which can not be provided with a normal bed.  Mr. Jose Crawford suffers from cholecystitis with recently placed percutaneous drain as well as dementia and a large lung mass. This impairs his ability to perform daily activities like toileting,  feeding, dressing, and bathing in the home.  A walker will not resolve the issue of performing daily activities of living.  A wheelchair will allow the patient to safely perform daily activities.  Patient is not able to propel themselves in the home using a standard weight wheelchair due to debilitation and weakness.  The patient can propel himself in a light wheelchair.   Code Status:  DNR/DNI  Romualdo Bolk [facility] Pager: [phone] 12/08/11 11:28 am.

## 2011-12-08 NOTE — Progress Notes (Signed)
Addendum  Patient seen and examined, chart and data base reviewed.  I agree with the above assessment and plan  For full details please see Mrs. Algis Downs PA. Note.  Acute cholecystitis, status post percutaneous drain.  Left lung mass likely malignant status post CT-guided biopsy, to be followed as outpatient with his oncologist.  Clint Lipps Pager: [phone] 12/08/2011, 3:26 PM

## 2011-12-08 NOTE — Discharge Summary (Signed)
Patient ID: Jose Crawford MRN: [id number] DOB/AGE: 1926-07-05 78 y.o.  Admit date: 12/02/2011 Discharge date: 12/08/2011  Primary Care Physician:  Pamelia Hoit, MD, MD  Discharge Diagnoses:   Present on Admission:  .Acute calculous cholecystitis .Lung mass .Prostate cancer .Dementia .Nephrolithiasis without hydronephrosis    Medication List  As of 12/08/2011  4:29 PM   TAKE these medications         acetaminophen 500 MG tablet   Commonly known as: TYLENOL   Take 1,000 mg by mouth every 6 (six) hours as needed. For pain      amoxicillin-clavulanate 875-125 MG per tablet   Commonly known as: AUGMENTIN   Take 1 tablet by mouth 2 (two) times daily.      ibuprofen 800 MG tablet   Commonly known as: ADVIL,MOTRIN   Take 1 tablet (800 mg total) by mouth every 6 (six) hours as needed.      nystatin cream   Commonly known as: MYCOSTATIN   Apply topically 2 (two) times daily. To groin            Consults:  1.  [facility] 2.  Interventional Radiology 3.  Cardiothoracic Surgery  Procedures:  1. Percutaneous Cholecystostomy placement via IR on 12/03/11 2. CT guided core biopsy of left mediastinal mass.  (pathology pending)   Brief H and P:Per history of present illness: This is an 78 year old Caucasian male who was in his normal state of health until around midnight on Wednesday. At that time he complained of stomach pains, nausea, and vomiting. His son-in-law also states that the patient had decreased mobility secondary to the abdominal discomfort. The patient denies any prior symptoms. He denies any fevers or chills. He denies any melena or hematochezia. He reports having a loose bowel movement today. He also reported some shortness of breath. He denies any chest pressure. He denies any weight loss. He lives with his daughter and son-in-law. He generally gets around with a walker. He denies any jaundice. He denies any difficulty swallowing. He denies any  heartburn. He doesn't take any scheduled daily medication. Because the patient continued to complain of stomach pains as well as having intermittent episodes of nausea he was brought to the emergency room for further evaluation.   Patient was admitted by surgery service on 12/02/2011, he was placed on NPO status, IV fluids and broad-spectrum IV antibiotics. Initial abdominal ultrasound showed calculus cholecystitis with possible emphysematous cholecystitis. CT abdomen pelvis was obtained which confirmed acute cholecystitis however also showed new finding of 9.5 cm x 6.8 cm mass in the inferior chest. Due to new finding of the large mass in the chest, IR was consulted and rather than open or laparoscopic cholecystectomy, patient underwent percutaneous cholecystostomy today. Called by general surgery service, Dr. Andrey Campanile, for hospitalist service to take over care given the new findings of a lung mass and further workup needed.  Acute calculous cholecystitis with sepsis from Group B strep.  Initially surgery planned to take the patient to the OR for cholecystectomy. However with the discovery of the mediastinal mass this plan was changed.  He received Percutaneous Cholecystostomy placement via IR on 12/03/11.  This will remain in place for 6 weeks. Group B strep(agalactiae) was cultured growing from bile, he was placed on augmentin for coverage.   He is to follow up with Dr. Andrey Campanile of general surgery in 4 weeks.  Draining from the tube has decreased nicely since placement.   Mediastinal Mass.  Felt likely to be bronchogenic  carcinoma, possible metastases from his previous prostate cancer, or possibly lymphoma.  Core biopsy was done 5/14.  Pathology is anticipated on 5/16.  Dr. Asa Lente office has been notified of the patient and should call him with a follow up appointment.   Altered mental status / dementia.  The patient is felt to have a degree of mild baseline dementia that was likely worsened by being in an  unfamiliar environment and the use of IV narcotics for pain.  IV narcotics were discontinued on 5/15 and the patient has become more alert, and back to baseline.  B12, and folate were checked.  B12 was normal and RBC folate was high.  Hypercalcemia - corrected calcium approx 13. PTH elevated, as is ionized calcium (c/w primary hyperparathyroidism), though one would expect PTH to be low in this setting. 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 Patient's calcium remained high despite IV fluids. Gave one dose of Pamidronate IV on 12/07/11. Will need calcium / albumin checked out patient.   History of Prostate cancer.  PSA has been checked and is quite low   Nephrolithiasis without hydronephrosis.  No acute complications at this time   Systolic and diastolic CHF- chronic- ECHO 12/04/11.  EF 45-50% and gr 1 diastolic dysf - appears euvolemic at the present time   Yeast in inguinal area.  Nystatin cream to affected area   Lesion on top of head (possibly another Basal cell).  Will need out patient dermatological follow up and removal.   Physical Exam on Discharge: General: A&O, Lying in bed, moves and speaks more slowly today.  Lungs: CTA no W/C/R  Cardiovascular: Regular rate and rhythm without murmur gallop or rub normal S1 and S2  Abdomen: Nontender, nondistended, soft, bowel sounds positive, no rebound, no ascites, no appreciable mass - GB drain intact in RUQ draining small amount of brown fluid.  Extremities: No significant cyanosis, clubbing, or edema bilateral lower extremities  Skin: Patient's inguinal area bilaterally appears less pink and damp (no papules). Top of his head reveals an approximately 1 cm annular lesion with scale.  Neuro: Slow to respond. Unable to make simple decisions (would you like tomato or vegetable soup for lunch?). Otherwise non-focal.   Filed Vitals:   12/07/11 1511 12/07/11 2028 12/08/11 0454 12/08/11 1347  BP:  125/76 120/77 118/77  Pulse: 85 75 59  88  Temp:  98.4 F (36.9 C) 98 F (36.7 C) 98.1 F (36.7 C)  TempSrc:  Oral Oral Oral  Resp:  17 17 18   Height:      Weight:      SpO2: 96% 95% 92% 96%     Intake/Output Summary (Last 24 hours) at 12/08/11 1629 Last data filed at 12/08/11 1504  Gross per 24 hour  Intake    200 ml  Output   1065 ml  Net   -865 ml    Basic Metabolic Panel:  Lab 12/08/11 1610 12/07/11 0619 12/03/11 0420  NA 134* 136 --  K 3.9 4.3 --  CL 100 102 --  CO2 23 25 --  GLUCOSE 99 100* --  BUN 17 17 --  CREATININE 0.87 0.91 --  CALCIUM 11.6* 12.1* --  MG -- -- 1.6  PHOS -- -- 1.8*   Liver Function Tests:  Lab 12/04/11 0422 12/03/11 0420  AST 22 25  ALT 13 10  ALKPHOS 85 62  BILITOT 0.8 0.9  PROT 5.6* 5.6*  ALBUMIN 2.7* 2.9*    Lab 12/02/11 1300  LIPASE 13  AMYLASE --  CBC:  Lab 12/08/11 0535 12/07/11 0619 12/04/11 0422 12/02/11 1300  WBC 13.7* 11.0* -- --  NEUTROABS -- -- 8.7* 18.5*  HGB 13.7 14.4 -- --  HCT 40.5 42.7 -- --  MCV 90.4 90.7 -- --  PLT 362 335 -- --   Cardiac Enzymes:  Lab 12/02/11 1255  CKTOTAL --  CKMB --  CKMBINDEX --  TROPONINI <0.30   Coagulation:  Lab 12/02/11 1300  LABPROT 15.0  INR 1.16   Anemia Panel:  Lab 12/07/11 0619  VITAMINB12 313  FOLATE --  FERRITIN --  TIBC --  IRON --  RETICCTPCT --     Other Pertinent Lab Results:    Results for CHANTRY, HEADEN (MRN [id number]) as of 12/08/2011 16:02  Ref. Range 12/05/2011 18:24  Calcium Ionized Latest Range: 1.12-1.32 mmol/L 1.66 [facility])   Results for KHADEN, GATER (MRN [id number]) as of 12/08/2011 16:02  Ref. Range 12/05/2011 18:25  PTH Latest Range: 14.0-72.0 pg/mL 153.4 (H)   Results for JERYL, UMHOLTZ (MRN [id number]) as of 12/08/2011 16:02  Ref. Range 12/03/2011 14:58  CEA Latest Range: 0.0-5.0 ng/mL <0.5  PSA Latest Range: <=4.00 ng/mL <0.01 (L)    Results for ALUCARD, FEARNOW (MRN [id number]) as of 12/08/2011 16:02  Ref. Range 12/07/2011 06:19  RBC Folate Latest Range: >=366 ng/mL  803 (H)  Vitamin B-12 Latest Range: 211-911 pg/mL 313    Significant Diagnostic Studies:  Ct Chest W Contrast  12/03/2011  *RADIOLOGY REPORT*  Clinical Data: Chest mass.  CT CHEST WITH CONTRAST  Technique:  Multidetector CT imaging of the chest was performed following the standard protocol during bolus administration of intravenous contrast.  Contrast: 80mL OMNIPAQUE IOHEXOL 300 MG/ML  SOLN  Comparison: CT abdomen and pelvis 12/02/2011.  Findings: There is a large mass immediately adjacent to the left heart border measuring 6.2 cm transverse by 9.3 cm AP by 8.7 cm cranial-caudal. It is contiguous with the left ventricle but there appears to be a fat plane between the lesion and the pulmonary outflow tract and left ventricle.  The lesion appears to within the mediastinum and could be lymphoma or less likely thymoma. Bronchogenic carcinoma cannot be excluded.  The patient has a very small right pleural effusion and basilar airspace disease.  Heart size is normal.  Coronary and aortic atherosclerotic vascular disease is noted.  Dependent atelectasis is seen on the right.  Incidentally imaged upper abdomen shows partial visualization of a drain within the gallbladder.  No focal bony abnormality.  IMPRESSION: Large mass along the left heart border appears to be within the mediastinum and could be due to lymphoma or less likely thymoma. The lesion could be within lung and due to bronchogenic carcinoma.  Original Report Authenticated By: Bernadene Bell. Maricela Curet, M.D.   US Abdomen Complete  12/02/2011  *RADIOLOGY REPORT*  Clinical Data:  Right upper quadrant pain and fever  ABDOMINAL ULTRASOUND COMPLETE  Comparison:  None.  Findings:  Gallbladder:   There are multiple small gallstones and tumefactive sludge within the gallbladder lumen.  There is gallbladder wall thickening and pericholecystic fluid.  There is dirty shadowing from a portion of the gallbladder wall which is worrisome for gas.  Common Bile Duct:  Within  normal limits in caliber.  Measures 4 mm.  Liver: No focal mass lesion identified.  Within normal limits in parenchymal echogenicity.  IVC:  Appears normal.  Pancreas:  No abnormality identified.  Spleen:  Within normal limits in size and echotexture.  Right kidney:  Normal in size and  parenchymal echogenicity.  No evidence of mass or hydronephrosis.  Left kidney:  Normal in size and parenchymal echogenicity.  No evidence of mass or hydronephrosis.  Abdominal Aorta:  No aneurysm identified.  IMPRESSION: Cholelithiasis with cholecystitis.  Gas within a portion of the gallbladder wall is also suspected, consistent with emphysematous cholecystitis. Discussed with Dr. Manus Gunning.  Original Report Authenticated By: Brandon Melnick, M.D.   Ct Abdomen Pelvis W Contrast  12/02/2011  *RADIOLOGY REPORT*  Clinical Data: Short of breath.  Pain all over.  History of prostate cancer.  CT ABDOMEN AND PELVIS WITH CONTRAST  Technique:  Multidetector CT imaging of the abdomen and pelvis was performed following the standard protocol during bolus administration of intravenous contrast.  Contrast: OMNIPAQUE IOHEXOL 300 MG/ML  SOLN  Comparison: 12/02/2011 abdominal ultrasound.  Findings: Lung Bases: There is a mass in the inferior left chest abutting the pericardium.  Fat plane between the mass and the left ventricle is effaced.  Severe coronary artery atherosclerosis is present.  The dependent atelectasis is present in the right lung.  Liver:  Tiny left hepatic lobe low density lesions probably representing cysts or hemangioma.  No mass lesion.  Small diaphragmatic lymph node incidentally noted (image 20 series 2).  Spleen:  Normal.  Gallbladder:  Inflamed.  Dependently layering high-density material most compatible with cholelithiasis.  Pericholecystic fluid and fat stranding.  Likely reactive mural thickening of the hepatic flexure of the colon.  Common bile duct:  No intra or extrahepatic biliary ductal dilation.  No common duct  stone.  Pancreas:  Normal.  Adrenal glands:  Mild thickening of the left adrenal gland, likely representing hyperplasia.  Kidneys:  Nonobstructing bilateral renal collecting system calculi. Normal renal enhancement.  Largest cluster of calculi in the right inferior renal pole measuring 10 mm x 9 mm.  13 mm left interpolar simple renal cyst.  Retroaortic left renal vein incidentally noted.  Stomach:  Within normal limits.  Small bowel:  No small bowel obstruction. Penile prosthesis reservoir in the right lower quadrant.  Colon:   Diverticulosis.  Inflammatory changes of the hepatic flexure.  Pelvic Genitourinary:  Urinary bladder appears within normal limits.  Surgical clips in the anatomic pelvis compatible with prostatectomy.  Tubing is present in the right inguinal region compatible with a prosthesis.  Bones:  Lumbar spondylosis and scoliosis.  Degenerative endplate changes.  Lower lumbar facet arthrosis.  No aggressive osseous lesions are identified.  There is no retroperitoneal adenopathy.  No inguinal adenopathy.  Vasculature: There are right iliac atherosclerosis without aneurysm.  No acute vascular abnormality.  IMPRESSION: 1.  Acute cholecystitis with inflammatory changes extending to the hepatic flexure of the colon.  Cholelithiasis.  2.  Nephrolithiasis.  No hydronephrosis.  Renal cyst.  3.  Atherosclerosis and coronary artery disease.  4.  9.5 cm x 6.8 cm mass is partially visualized in the inferior chest.  This would be atypical presentation for metastatic prostate cancer in the absence of bone lesions or retroperitoneal adenopathy.  Primary bronchogenic carcinoma is the top consideration although the appearance is nonspecific.  Original Report Authenticated By: Andreas Newport, M.D.   Ir Perc Cholecystostomy  12/03/2011  *RADIOLOGY REPORT*  Clinical Data: Acute cholecystitis, nonoperative candidate  ULTRASOUND FLUOROSCOPIC PERCUTANEOUS TRANSHEPATIC CHOLECYSTOSTOMY  Date:  12/03/2011 14:15:00   Radiologist:  M. Ruel Favors, M.D.  Medications:  The patient is already receiving daily antibiotics, 1% lidocaine locally.  Conscious sedation was not necessary.  Guidance:  Ultrasound fluoroscopic  Fluoroscopy time:  0.5  minutes  Sedation time:  None.  Contrast volume:  5 ml Omnipaque-300  Complications:  No immediate  PROCEDURE/FINDINGS:  Informed consent was obtained from the patient following explanation of the procedure, risks, benefits and alternatives. The patient understands, agrees and consents for the procedure. All questions were addressed.  A time out was performed.  Maximal barrier sterile technique utilized including caps, mask, sterile gowns, sterile gloves, large sterile drape, hand hygiene, and betadine  Previous CT reviewed.  Acute calculus cholecystitis noted. Preliminary ultrasound performed demonstrating a distended thick walled gallbladder containing gallstones.  Under sterile conditions and local anesthesia, a percutaneous access needle was advanced from a right upper quadrant transhepatic approach into the gallbladder with ultrasound.  Images obtained for documentation. There was return of bile.  Guide wire advanced followed by the Accustick dilator.  This allowed exchange for an Amplatz guide wire.  10-French drain advanced over the Amplatz guide wire. Retention loop formed in the gallbladder.  Syringe aspiration yielded 80 ml bile.  Sample sent for Gram stain and culture. Contrast injection confirms position.  Filling defects in the gallbladder noted compatible with gallstones.  Catheter secured with a Prolene suture and connected to external drainage.  Sterile dressing applied.  No immediate complication.  The patient tolerated the procedure well.  IMPRESSION: Successful ultrasound and fluoroscopic transhepatic 10-French cholecystostomy.  Original Report Authenticated By: Judie Petit. Ruel Favors, M.D.   Ct Biopsy  12/07/2011  *RADIOLOGY REPORT*  Clinical Data: 10 cm left mediastinal mass.   The patient presents for biopsy.  CT GUIDED CORE BIOPSY OF MEDIASTINAL MASS  Sedation:   1.0 mg IV Versed;  25 mcg IV Fentanyl  Total Moderate Sedation Time: 20 minutes.  Procedure:  The procedure risks, benefits, and alternatives were explained to the patient.  Questions regarding the procedure were encouraged and answered.  The patient understands and consents to the procedure.  The right lateral chest wall was prepped with Betadine in a sterile fashion, and a sterile drape was applied covering the operative field.  A sterile gown and sterile gloves were used for the procedure.  Local anesthesia was provided with 1% Lidocaine.  CT was performed in a supine position.  From a lateral approach, a 17 gauge needle was advanced into the left-sided mediastinal mass under CT guidance.  After confirming needle tip position, multiple coaxial 18-gauge core biopsy samples were obtained.  A total of seven samples were submitted in both formalin and saline. The outer needle was removed and additional postbiopsy CT images performed.  Complications: None.  Findings: Huge left-sided mediastinal mass was localized.  Biopsy yield solid tissue.  Postbiopsy imaging shows no immediate pneumothorax or hemorrhage.  IMPRESSION: CT guided core biopsy performed of the left-sided mediastinal mass.  Original Report Authenticated By: Reola Calkins, M.D.   Dg Chest Portable 1 View  12/02/2011  *RADIOLOGY REPORT*  Clinical Data: Shortness of breath.  PORTABLE CHEST - 1 VIEW 12/02/2011 1324 hours:  Comparison: None.  Findings: Cardiac silhouette mildly enlarged even allowing for the AP portable technique, with mildly prominent central pulmonary arteries.  Thoracic aorta atherosclerotic.  Elevation of the right hemidiaphragm.  Linear opacities in both lung bases.  Lungs otherwise clear.  Pulmonary vascularity normal.  No visible pleural effusions.  IMPRESSION: Mild cardiomegaly.  Linear scar or atelectasis in the lung bases. No acute  cardiopulmonary disease otherwise.  Original Report Authenticated By: Arnell Sieving, M.D.      Disposition and Follow-up: stable for discharge to home with home health  services.    Follow-up Information    Follow up with Atilano Ina, MD,FACS. Schedule an appointment as soon as possible for a visit in 4 weeks.   Contact information:   [employer] [place] [address], [address] [place] [address] [phone]       Follow up with Lajuana Matte., MD. (The office will call you with an appointment)    Contact information:   [address] [place] [address] [phone]           Time spent on Discharge: 40 min.  SignedStephani Police 12/08/2011, 4:29 PM [phone]

## 2011-12-09 DIAGNOSIS — R222 Localized swelling, mass and lump, trunk: Secondary | ICD-10-CM

## 2011-12-09 DIAGNOSIS — C61 Malignant neoplasm of prostate: Secondary | ICD-10-CM

## 2011-12-09 DIAGNOSIS — K810 Acute cholecystitis: Secondary | ICD-10-CM

## 2011-12-09 DIAGNOSIS — A413 Sepsis due to Hemophilus influenzae: Secondary | ICD-10-CM

## 2011-12-09 LAB — CBC
HCT: 41.1 % (ref 39.0–52.0)
MCH: 30.3 pg (ref 26.0–34.0)
MCV: 90.1 fL (ref 78.0–100.0)
Platelets: 384 10*3/uL (ref 150–400)
RBC: 4.56 MIL/uL (ref 4.22–5.81)

## 2011-12-09 NOTE — Plan of Care (Signed)
Problem: Phase III Progression Outcomes Goal: Activity at appropriate level-compared to baseline (UP IN CHAIR FOR HEMODIALYSIS)  Outcome: Adequate for Discharge Will have H/H PT

## 2011-12-09 NOTE — Progress Notes (Signed)
12/09/11 NSG 1300 Patient discharged to home with family, discharged instructions given and reviewed with patient & daughter.  Both verbalized understanding, care notes given for new meds and pertinent education.Also instructed daughter on how to flush drain and change dressing around drain site.   Skin intact at discharge, IV discharged and intact. Patient escorted to car via wheelchair by volunteer service.  Forbes Cellar, RN

## 2011-12-09 NOTE — Progress Notes (Signed)
BRIEF FOLLOW-UP NOTE:  Please see complete d/c summary completed 12/08/11 by Algis Downs, PA. Pt remains stable for discharge home today. He will be discharged to daughter's home where he lived prior to admission. Core biopsy results are pending at time of discharge. Dr Asa Lente office is to call pt and arrange follow-up. Pt is also to follow-up with general surgery in 4 weeks for reevaluation of percutaneous chole drain.   Filed Vitals:   12/09/11 0532  BP: 112/73  Pulse: 82  Temp: 98.3 F (36.8 C)  Resp: 20   Physical Exam:  General: awake, alert chronically weak appearing in NAD CV: S1S2 RRR, no m/r/g, no LE edema Resp: CTAB, no w/r/c, no increased wob GI: abdomen soft, NT/ND. RUQ GB drain draining small amt of bilious fluid Neuro: no m/s deficits on exam Psych: alert, oriented to name and place. Reluctant to answer questions. Perhaps some mild confusion.  Again, pt felt medically stable for discharge home. Will assess need for home O2 prior to discharge. D/C summary routed to PCP and oncology.  Cordelia Pen, NP-C [facility]  pgr [phone]

## 2011-12-09 NOTE — Progress Notes (Signed)
Physical Therapy Treatment Patient Details Name: Jose Crawford MRN: [id number] DOB: 08-10-1925 Today's Date: 12/09/2011 Time: 1001-1030 PT Time Calculation (min): 29 min  PT Assessment / Plan / Recommendation Comments on Treatment Session  Pt adm with cholecystitis and chest mass.  Mobility limited by mentation and need for consistent redirection, along with weakness, decr. actiivity tolerance and decr balance.    Follow Up Recommendations  Home health PT;Supervision/Assistance - 24 hour    Barriers to Discharge        Equipment Recommendations  Rolling walker with 5" wheels;Wheelchair (measurements)    Recommendations for Other Services    Frequency Min 3X/week   Plan Frequency remains appropriate;Discharge plan needs to be updated    Precautions / Restrictions Precautions Precautions: Fall Restrictions Weight Bearing Restrictions: No   Pertinent Vitals/Pain     Mobility  Bed Mobility Bed Mobility: Not assessed Transfers Transfers: Sit to Stand;Stand to Sit Sit to Stand: 4: Min guard;With armrests;From chair/3-in-1 Stand to Sit: 4: Min guard;With armrests;To chair/3-in-1 Details for Transfer Assistance: vc's for hand placement and safety; Ambulation/Gait Ambulation/Gait Assistance: 4: Min guard;4: Min Environmental consultant (Feet): 220 Feet Assistive device: Rolling walker Ambulation/Gait Assistance Details: frequent tc/vc to redirect pt to task of walking/propelling forward otherwise pt stop, stares  or gets distracted.  gait described by short shuffles steps with stooped posture. Gait Pattern: Decreased stride length;Trunk flexed;Shuffle Stairs: No    Exercises     PT Diagnosis:    PT Problem List:   PT Treatment Interventions:     PT Goals Acute Rehab PT Goals Time For Goal Achievement: 12/20/11 Potential to Achieve Goals: Good PT Goal: Supine/Side to Sit - Progress: Progressing toward goal PT Goal: Sit to Supine/Side - Progress: Progressing toward  goal PT Goal: Sit to Stand - Progress: Progressing toward goal PT Goal: Ambulate - Progress: Progressing toward goal  Visit Information  Last PT Received On: 12/09/11 Assistance Needed: +1    Subjective Data  Subjective: Open the door (bathroom)... to air it out Patient Stated Goal: home    Cognition  Overall Cognitive Status: Impaired Area of Impairment: Attention;Memory;Following commands Arousal/Alertness: Awake/alert Orientation Level: Person Behavior During Session: Jose Crawford for tasks performed Current Attention Level: Focused Attention - Other Comments: Needs verbal cues to complete task. Following Commands: Follows one step commands inconsistently Cognition - Other Comments: Requires repetition of commands.    Balance  Balance Balance Assessed: No Static Standing Balance Static Standing - Balance Support: Bilateral upper extremity supported Static Standing - Level of Assistance: 4: Min assist  End of Session PT - End of Session Activity Tolerance: Patient tolerated treatment well Patient left: in chair;with call bell/phone within reach;with nursing in room Nurse Communication: Mobility status    Jose Crawford, Jose Crawford 12/09/2011, 10:39 AM  12/09/2011  Jose Crawford Jose Crawford, PT [phone] (pager)

## 2011-12-09 NOTE — Progress Notes (Signed)
Addendum  Patient seen and examined, chart and data base reviewed.  I agree with the above assessment and plan  For full details please see Mrs. Cordelia Pen, NP note.  Clint Lipps Pager: [phone] 12/09/2011, 3:10 PM

## 2011-12-09 NOTE — Discharge Summary (Signed)
Addendum  Patient seen and examined, chart and data base reviewed.  I agree with the above assessment and plan  For full details please see Mrs. Algis Downs PA. Note.  Acute cholecystitis, or lung mass, likely malignant.  Clint Lipps Pager: [phone] 12/09/2011, 3:11 PM

## 2011-12-10 ENCOUNTER — Telehealth: Payer: Self-pay | Admitting: Medical Oncology

## 2011-12-10 ENCOUNTER — Telehealth: Payer: Self-pay | Admitting: Internal Medicine

## 2011-12-10 NOTE — Telephone Encounter (Signed)
Del.12/10/11 Ref.Dr.James Wyatt Dx.Lung Mass

## 2011-12-10 NOTE — Telephone Encounter (Signed)
l/m for her to call for new pt appt  aom

## 2011-12-10 NOTE — Telephone Encounter (Signed)
Pt just got discharged from the hospital and wants results of biopsy. He has never been seen here but saw Dr Donnald Garre in the hospital. I told Lorene Dy that the pt needs to call the provider who ordered the biopsy for the results. I called the  scheduler to f/u

## 2011-12-10 NOTE — Telephone Encounter (Signed)
left another mess. with her as she called back earlier and l/m for diane  aom

## 2011-12-14 ENCOUNTER — Telehealth (INDEPENDENT_AMBULATORY_CARE_PROVIDER_SITE_OTHER): Payer: Self-pay | Admitting: General Surgery

## 2011-12-14 NOTE — Telephone Encounter (Signed)
Patient needs supplies to flush drain. Needs saline and syringes. Jose Crawford with [facility] states these supplies were not sent with her to the patient's home. Needs to flush drain for 6 weeks. I called [facility] office and spoke with [name] who states supplies are ordered through [facility]. She told me she would handle it and get supplies to the patient.

## 2011-12-16 ENCOUNTER — Telehealth (INDEPENDENT_AMBULATORY_CARE_PROVIDER_SITE_OTHER): Payer: Self-pay | Admitting: General Surgery

## 2011-12-16 NOTE — Telephone Encounter (Signed)
Left message letting patient know follow up appt date and time on 12/29/11 @ 8:45 am.

## 2011-12-21 ENCOUNTER — Ambulatory Visit: Payer: Medicare Other

## 2011-12-21 ENCOUNTER — Other Ambulatory Visit (HOSPITAL_BASED_OUTPATIENT_CLINIC_OR_DEPARTMENT_OTHER): Payer: Medicare Other | Admitting: Lab

## 2011-12-21 ENCOUNTER — Ambulatory Visit (HOSPITAL_BASED_OUTPATIENT_CLINIC_OR_DEPARTMENT_OTHER): Payer: Medicare Other | Admitting: Internal Medicine

## 2011-12-21 ENCOUNTER — Encounter: Payer: Self-pay | Admitting: *Deleted

## 2011-12-21 DIAGNOSIS — C37 Malignant neoplasm of thymus: Secondary | ICD-10-CM

## 2011-12-21 LAB — CBC WITH DIFFERENTIAL/PLATELET
BASO%: 1.1 % (ref 0.0–2.0)
Basophils Absolute: 0.1 10*3/uL (ref 0.0–0.1)
EOS%: 4.5 % (ref 0.0–7.0)
HCT: 39.8 % (ref 38.4–49.9)
HGB: 13.3 g/dL (ref 13.0–17.1)
LYMPH%: 19.8 % (ref 14.0–49.0)
MCH: 30.6 pg (ref 27.2–33.4)
MCHC: 33.3 g/dL (ref 32.0–36.0)
MCV: 91.9 fL (ref 79.3–98.0)
MONO%: 5.7 % (ref 0.0–14.0)
NEUT%: 68.9 % (ref 39.0–75.0)
Platelets: 429 10*3/uL — ABNORMAL HIGH (ref 140–400)
lymph#: 1.8 10*3/uL (ref 0.9–3.3)

## 2011-12-21 LAB — COMPREHENSIVE METABOLIC PANEL
AST: 18 U/L (ref 0–37)
Albumin: 4.1 g/dL (ref 3.5–5.2)
Alkaline Phosphatase: 60 U/L (ref 39–117)
BUN: 20 mg/dL (ref 6–23)
Creatinine, Ser: 0.87 mg/dL (ref 0.50–1.35)
Glucose, Bld: 62 mg/dL — ABNORMAL LOW (ref 70–99)

## 2011-12-21 NOTE — Progress Notes (Signed)
Spoke with pt and family at [facility] today.  Questions and concerns addressed 

## 2011-12-21 NOTE — Progress Notes (Signed)
[facility] Telephone:([phone]   [phone]  CONSULT NOTE  REASON FOR CONSULTATION:  78 years old white male with recently diagnosed thymoma.  HPI Jose Crawford is a 78 y.o. male was past medical history significant for prostate adenocarcinoma status post prostatectomy almost 20 years ago. The patient also has a history of stroke 30 years ago with mild dementia. He has a remote history of smoking but quit 44 years ago. The patient was admitted to [facility] on 12/02/2011 complaining of stomach pain, nausea and vomiting. During his evaluation CT scan of the abdomen and pelvis was performed on 12/02/2011 and it showed acute cholecystitis with inflammatory changes extending to the hepatic flexure of the colon in addition to cholelithiasis. He was also found to have 9.5 cm x 6.8 cm mass is partially visualized in the inferior chest. This would be atypical presentation for metastatic prostate  cancer in the absence of bone lesions or retroperitoneal adenopathy. The patient underwent ultrasound fluoroscopic Percutaneous transhepatic cholecystostomy. CT scan of the chest on 12/03/2011 showed a large mass immediately adjacent to the left  heart border measuring 6.2 cm transverse by 9.3 cm AP by 8.7 cm cranial-caudal. It is contiguous with the left ventricle but there  appears to be a fat plane between the lesion and the pulmonary outflow tract and left ventricle. The lesion appears to within the  mediastinum and could be lymphoma or less likely thymoma. Bronchogenic carcinoma cannot be excluded. On 12/07/2011 the patient underwent a CT-guided core biopsy of the mediastinal mass by interventional radiology. 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 The neoplasm demonstrates the following immunophenotype:CD5 - negative, CD34 - negative, CD31 - negative, CD30 - negative, CD3 - expressed in benign lymphocytes, Calretinin - negative, Cytokeratin  AE1/AE3 - strong diffuse expression, S100 - negative expression, TTF-1 - negative expression, TDT - negative expression, Synaptophysin - negative expression, Chromogranin - negative expression, Cytokeratin 5/6 - strong diffuse expression, Overall, the morphology and immunophenotype are that of thymoma. With these limited biopsies, the morphology is consistent with spindle cell type (type A). The patient was referred to me today for evaluation and recommendation regarding treatment of his condition. He denied having any significant complaints today except for a few pounds of weight loss. The biliary drainage is working fine. He denied having any significant chest pain, shortness of breath, cough or hemoptysis. His daughter mention a history of some recent falls. He has extensive workup performed that was unremarkable or any etiology of his falls.  The patient is a widow and he has one daughter named [name] who accompanied him to the visit today with her husband. He has a remote history of smoking but quit 44 years ago. He has no history of alcohol or drug abuse.   @SFHPI @  Past Medical History  Diagnosis Date  . Basal cell carcinoma   . Prostate cancer     Past Surgical History  Procedure Date  . Prostatectomy     No family history on file.  Social History History  Substance Use Topics  . Smoking status: Never Smoker   . Smokeless tobacco: Not on file  . Alcohol Use: No    No Known Allergies  Current Outpatient Prescriptions  Medication Sig Dispense Refill  . acetaminophen (TYLENOL) 500 MG tablet Take 1,000 mg by mouth every 6 (six) hours as needed. For pain      . nystatin cream (MYCOSTATIN) Apply topically 2 (two) times daily. To  groin  30 g  0    Review of Systems  A comprehensive review of systems was negative except for: Constitutional: positive for fatigue and weight loss  Physical Exam  ZOX:WRUEA, healthy, no distress, well nourished and well developed SKIN: skin color,  texture, turgor are normal HEAD: Normocephalic, No masses, lesions, tenderness or abnormalities EYES: normal EARS: External ears normal OROPHARYNX:no exudate and no erythema  NECK: supple, no adenopathy LYMPH:  no palpable lymphadenopathy, no hepatosplenomegaly LUNGS: clear to auscultation  HEART: regular rate & rhythm, no murmurs and no gallops ABDOMEN:abdomen soft, non-tender, normal bowel sounds, no masses or organomegaly and biliary drainage intact no signs of infection. BACK: Back symmetric, no curvature. EXTREMITIES:no joint deformities, effusion, or inflammation, no edema, no skin discoloration, no clubbing, no cyanosis  NEURO: alert & oriented x 3 with fluent speech, no focal motor/sensory deficits, gait normal  PERFORMANCE STATUS: ECOG   Studies/Results: Ct Chest W Contrast  12/03/2011  *RADIOLOGY REPORT*  Clinical Data: Chest mass.  CT CHEST WITH CONTRAST  Technique:  Multidetector CT imaging of the chest was performed following the standard protocol during bolus administration of intravenous contrast.  Contrast: 80mL OMNIPAQUE IOHEXOL 300 MG/ML  SOLN  Comparison: CT abdomen and pelvis 12/02/2011.  Findings: There is a large mass immediately adjacent to the left heart border measuring 6.2 cm transverse by 9.3 cm AP by 8.7 cm cranial-caudal. It is contiguous with the left ventricle but there appears to be a fat plane between the lesion and the pulmonary outflow tract and left ventricle.  The lesion appears to within the mediastinum and could be lymphoma or less likely thymoma. Bronchogenic carcinoma cannot be excluded.  The patient has a very small right pleural effusion and basilar airspace disease.  Heart size is normal.  Coronary and aortic atherosclerotic vascular disease is noted.  Dependent atelectasis is seen on the right.  Incidentally imaged upper abdomen shows partial visualization of a drain within the gallbladder.  No focal bony abnormality.  IMPRESSION: Large mass along the  left heart border appears to be within the mediastinum and could be due to lymphoma or less likely thymoma. The lesion could be within lung and due to bronchogenic carcinoma.  Original Report Authenticated By: Bernadene Bell. Maricela Curet, M.D.   US Abdomen Complete  12/02/2011  *RADIOLOGY REPORT*  Clinical Data:  Right upper quadrant pain and fever  ABDOMINAL ULTRASOUND COMPLETE  Comparison:  None.  Findings:  Gallbladder:   There are multiple small gallstones and tumefactive sludge within the gallbladder lumen.  There is gallbladder wall thickening and pericholecystic fluid.  There is dirty shadowing from a portion of the gallbladder wall which is worrisome for gas.  Common Bile Duct:  Within normal limits in caliber.  Measures 4 mm.  Liver: No focal mass lesion identified.  Within normal limits in parenchymal echogenicity.  IVC:  Appears normal.  Pancreas:  No abnormality identified.  Spleen:  Within normal limits in size and echotexture.  Right kidney:  Normal in size and parenchymal echogenicity.  No evidence of mass or hydronephrosis.  Left kidney:  Normal in size and parenchymal echogenicity.  No evidence of mass or hydronephrosis.  Abdominal Aorta:  No aneurysm identified.  IMPRESSION: Cholelithiasis with cholecystitis.  Gas within a portion of the gallbladder wall is also suspected, consistent with emphysematous cholecystitis. Discussed with Dr. Manus Gunning.  Original Report Authenticated By: Brandon Melnick, M.D.   Ct Abdomen Pelvis W Contrast  12/02/2011  *RADIOLOGY REPORT*  Clinical Data: Short of  breath.  Pain all over.  History of prostate cancer.  CT ABDOMEN AND PELVIS WITH CONTRAST  Technique:  Multidetector CT imaging of the abdomen and pelvis was performed following the standard protocol during bolus administration of intravenous contrast.  Contrast: OMNIPAQUE IOHEXOL 300 MG/ML  SOLN  Comparison: 12/02/2011 abdominal ultrasound.  Findings: Lung Bases: There is a mass in the inferior left chest abutting  the pericardium.  Fat plane between the mass and the left ventricle is effaced.  Severe coronary artery atherosclerosis is present.  The dependent atelectasis is present in the right lung.  Liver:  Tiny left hepatic lobe low density lesions probably representing cysts or hemangioma.  No mass lesion.  Small diaphragmatic lymph node incidentally noted (image 20 series 2).  Spleen:  Normal.  Gallbladder:  Inflamed.  Dependently layering high-density material most compatible with cholelithiasis.  Pericholecystic fluid and fat stranding.  Likely reactive mural thickening of the hepatic flexure of the colon.  Common bile duct:  No intra or extrahepatic biliary ductal dilation.  No common duct stone.  Pancreas:  Normal.  Adrenal glands:  Mild thickening of the left adrenal gland, likely representing hyperplasia.  Kidneys:  Nonobstructing bilateral renal collecting system calculi. Normal renal enhancement.  Largest cluster of calculi in the right inferior renal pole measuring 10 mm x 9 mm.  13 mm left interpolar simple renal cyst.  Retroaortic left renal vein incidentally noted.  Stomach:  Within normal limits.  Small bowel:  No small bowel obstruction. Penile prosthesis reservoir in the right lower quadrant.  Colon:   Diverticulosis.  Inflammatory changes of the hepatic flexure.  Pelvic Genitourinary:  Urinary bladder appears within normal limits.  Surgical clips in the anatomic pelvis compatible with prostatectomy.  Tubing is present in the right inguinal region compatible with a prosthesis.  Bones:  Lumbar spondylosis and scoliosis.  Degenerative endplate changes.  Lower lumbar facet arthrosis.  No aggressive osseous lesions are identified.  There is no retroperitoneal adenopathy.  No inguinal adenopathy.  Vasculature: There are right iliac atherosclerosis without aneurysm.  No acute vascular abnormality.  IMPRESSION: 1.  Acute cholecystitis with inflammatory changes extending to the hepatic flexure of the colon.   Cholelithiasis.  2.  Nephrolithiasis.  No hydronephrosis.  Renal cyst.  3.  Atherosclerosis and coronary artery disease.  4.  9.5 cm x 6.8 cm mass is partially visualized in the inferior chest.  This would be atypical presentation for metastatic prostate cancer in the absence of bone lesions or retroperitoneal adenopathy.  Primary bronchogenic carcinoma is the top consideration although the appearance is nonspecific.  Original Report Authenticated By: Andreas Newport, M.D.   Ir Perc Cholecystostomy  12/03/2011  *RADIOLOGY REPORT*  Clinical Data: Acute cholecystitis, nonoperative candidate  ULTRASOUND FLUOROSCOPIC PERCUTANEOUS TRANSHEPATIC CHOLECYSTOSTOMY  Date:  12/03/2011 14:15:00  Radiologist:  M. Ruel Favors, M.D.  Medications:  The patient is already receiving daily antibiotics, 1% lidocaine locally.  Conscious sedation was not necessary.  Guidance:  Ultrasound fluoroscopic  Fluoroscopy time:  0.5 minutes  Sedation time:  None.  Contrast volume:  5 ml Omnipaque-300  Complications:  No immediate  PROCEDURE/FINDINGS:  Informed consent was obtained from the patient following explanation of the procedure, risks, benefits and alternatives. The patient understands, agrees and consents for the procedure. All questions were addressed.  A time out was performed.  Maximal barrier sterile technique utilized including caps, mask, sterile gowns, sterile gloves, large sterile drape, hand hygiene, and betadine  Previous CT reviewed.  Acute calculus cholecystitis  noted. Preliminary ultrasound performed demonstrating a distended thick walled gallbladder containing gallstones.  Under sterile conditions and local anesthesia, a percutaneous access needle was advanced from a right upper quadrant transhepatic approach into the gallbladder with ultrasound.  Images obtained for documentation. There was return of bile.  Guide wire advanced followed by the Accustick dilator.  This allowed exchange for an Amplatz guide wire.  10-French  drain advanced over the Amplatz guide wire. Retention loop formed in the gallbladder.  Syringe aspiration yielded 80 ml bile.  Sample sent for Gram stain and culture. Contrast injection confirms position.  Filling defects in the gallbladder noted compatible with gallstones.  Catheter secured with a Prolene suture and connected to external drainage.  Sterile dressing applied.  No immediate complication.  The patient tolerated the procedure well.  IMPRESSION: Successful ultrasound and fluoroscopic transhepatic 10-French cholecystostomy.  Original Report Authenticated By: Judie Petit. Ruel Favors, M.D.   Ct Biopsy  12/07/2011  *RADIOLOGY REPORT*  Clinical Data: 10 cm left mediastinal mass.  The patient presents for biopsy.  CT GUIDED CORE BIOPSY OF MEDIASTINAL MASS  Sedation:   1.0 mg IV Versed;  25 mcg IV Fentanyl  Total Moderate Sedation Time: 20 minutes.  Procedure:  The procedure risks, benefits, and alternatives were explained to the patient.  Questions regarding the procedure were encouraged and answered.  The patient understands and consents to the procedure.  The right lateral chest wall was prepped with Betadine in a sterile fashion, and a sterile drape was applied covering the operative field.  A sterile gown and sterile gloves were used for the procedure.  Local anesthesia was provided with 1% Lidocaine.  CT was performed in a supine position.  From a lateral approach, a 17 gauge needle was advanced into the left-sided mediastinal mass under CT guidance.  After confirming needle tip position, multiple coaxial 18-gauge core biopsy samples were obtained.  A total of seven samples were submitted in both formalin and saline. The outer needle was removed and additional postbiopsy CT images performed.  Complications: None.  Findings: Huge left-sided mediastinal mass was localized.  Biopsy yield solid tissue.  Postbiopsy imaging shows no immediate pneumothorax or hemorrhage.  IMPRESSION: CT guided core biopsy performed  of the left-sided mediastinal mass.  Original Report Authenticated By: Reola Calkins, M.D.   Dg Chest Portable 1 View  12/02/2011  *RADIOLOGY REPORT*  Clinical Data: Shortness of breath.  PORTABLE CHEST - 1 VIEW 12/02/2011 1324 hours:  Comparison: None.  Findings: Cardiac silhouette mildly enlarged even allowing for the AP portable technique, with mildly prominent central pulmonary arteries.  Thoracic aorta atherosclerotic.  Elevation of the right hemidiaphragm.  Linear opacities in both lung bases.  Lungs otherwise clear.  Pulmonary vascularity normal.  No visible pleural effusions.  IMPRESSION: Mild cardiomegaly.  Linear scar or atelectasis in the lung bases. No acute cardiopulmonary disease otherwise.  Original Report Authenticated By: Arnell Sieving, M.D.     ASSESSMENT: This is a very pleasant 78 years old white male diagnosed with malignant thymoma, spindle cell type with no evidence of local invasion or metastasis.  PLAN: I have a lengthy discussion with the patient and his family today about his current disease stage prognosis and treatment options. I showed them the images of the scan. I explained to the patient that the only curative option at this point would be surgical resection. I discussed the case with Dr. Laneta Simmers and he kindly agreed to evaluate the patient for surgical resection. If the patient is  not a surgical candidate for resection, may be considered for radiotherapy alone or concurrent with chemotherapy. The patient and his family agreed to the current plan. I would see him back for followup visit after his surgical resection or sooner if needed. I gave the patient and his family the time to ask questions and I answered them completely to their satisfaction. All questions were answered. The patient knows to call the clinic with any problems, questions or concerns. We can certainly see the patient much sooner if necessary.  Thank you so much for allowing me to participate  in the care of Jose Crawford. I will continue to follow up the patient with you and assist in his care.  I spent 25 minutes counseling the patient face to face. The total time spent in the appointment was 50 minutes.   Jose Clipper K. 12/21/2011, 11:07 AM

## 2011-12-22 ENCOUNTER — Telehealth: Payer: Self-pay | Admitting: *Deleted

## 2011-12-22 ENCOUNTER — Encounter: Payer: Self-pay | Admitting: Internal Medicine

## 2011-12-22 NOTE — Telephone Encounter (Signed)
Spoke with son in law [place] regarding appt for Jose Crawford.  He verbalized understanding of time and place of appt.  Appt is with Dr. Laneta Simmers at [facility] 12/24/11

## 2011-12-22 NOTE — Progress Notes (Signed)
New patient,patients daughter came to his appointment for him patient sat out in lobby patient was not feeling like walking down hall way, patient daughter was POA patient was not in need of financial assistance at this time, gave patient my contact information.

## 2011-12-24 ENCOUNTER — Institutional Professional Consult (permissible substitution) (INDEPENDENT_AMBULATORY_CARE_PROVIDER_SITE_OTHER): Payer: Medicare Other | Admitting: Surgery

## 2011-12-24 ENCOUNTER — Telehealth (INDEPENDENT_AMBULATORY_CARE_PROVIDER_SITE_OTHER): Payer: Self-pay

## 2011-12-24 ENCOUNTER — Encounter: Payer: Self-pay | Admitting: Surgery

## 2011-12-24 VITALS — BP 110/70 | HR 76 | Resp 20 | Ht 68.5 in | Wt 168.0 lb

## 2011-12-24 DIAGNOSIS — C37 Malignant neoplasm of thymus: Secondary | ICD-10-CM

## 2011-12-24 NOTE — Progress Notes (Signed)
HPI:  The patient is an 78 year old gentleman who was admitted with nausea, vomiting, abdominal pain and diagnosed with acute cholecystitis. His initial chest x-ray was not read as showing a lung mass but when he had an abdominal CT scan performed the uppermost cuts clearly showed a large mass adjacent to the left ventricle. His acute cholecystitis was treated with a percutaneous cholecystostomy tube with quick resolution of his leukocytosis. A complete chest CT scan showed a 6.2 x 9.3 x 8.7 cm mass adjacent to the left heart border. This looked like it may be within the mediastinum but was not possible to completely rule out that it was a bronchogenic carcinoma. 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 He was discharged from the hospital and had a followup appointment with [name]. He felt the best treatment would be surgical resection if possible and the patient was referred back to me for consideration of that. He is here with his daughter today. He has been living with her for the past 4 years. She said that he has had some decline in his functioning over the past 4 years but is still fairly active and has had very few problems until his acute cholecystitis. He's been doing well since his recent discharge.   Current Outpatient Prescriptions  Medication Sig Dispense Refill  . acetaminophen (TYLENOL) 500 MG tablet Take 1,000 mg by mouth every 6 (six) hours as needed. For pain      . nystatin cream (MYCOSTATIN) Apply topically 2 (two) times daily. To groin  30 g  0     Physical Exam: BP 110/70  Pulse 76  Resp 20  Ht 5' 8.5" (1.74 m)  Wt 168 lb (76.204 kg)  BMI 25.17 kg/m2 He looks well. Cardiac exam shows regular rate and rhythm with normal heart sounds. His lung exam is clear. The cholecystostomy tube is draining clear,amber bile. His abdomen is soft, flat, and nontender.  Diagnostic Tests:  Clinical Data: Chest mass.   CT CHEST WITH  CONTRAST   Technique:  Multidetector CT imaging of the chest was performed following the standard protocol during bolus administration of intravenous contrast.   Contrast: 80mL OMNIPAQUE IOHEXOL 300 MG/ML  SOLN   Comparison: CT abdomen and pelvis 12/02/2011.   Findings: There is a large mass immediately adjacent to the left heart border measuring 6.2 cm transverse by 9.3 cm AP by 8.7 cm cranial-caudal. It is contiguous with the left ventricle but there appears to be a fat plane between the lesion and the pulmonary outflow tract and left ventricle.  The lesion appears to within the mediastinum and could be lymphoma or less likely thymoma. Bronchogenic carcinoma cannot be excluded.  The patient has a very small right pleural effusion and basilar airspace disease.  Heart size is normal.  Coronary and aortic atherosclerotic vascular disease is noted.  Dependent atelectasis is seen on the right.   Incidentally imaged upper abdomen shows partial visualization of a drain within the gallbladder.  No focal bony abnormality.   IMPRESSION: Large mass along the left heart border appears to be within the mediastinum and could be due to lymphoma or less likely thymoma. The lesion could be within lung and due to bronchogenic carcinoma.   Original Report Authenticated By: Bernadene Bell. Maricela Curet, M.D.     Pathology:  FINAL DIAGNOSIS Diagnosis Mediastinum, biopsy, 10cm-left - THYMOMA, SPINDLE CELL TYPE, SEE COMMENT. Microscopic Comment The neoplasm demonstrates the following immunophenotype: CD5 - negative CD34 -  negative CD31 - negative CD30 - negative CD3 - expressed in benign lymphocytes Calretinin - negative Cytokeratin AE1/AE3 - strong diffuse expression S100 - negative expression TTF-1 - negative expression TDT - negative expression Synaptophysin - negative expression Chromogranin - negative expression Cytokeratin 5/6 - strong diffuse expression Overall, the morphology and  immunophenotype are that of thymoma. With these limited biopsies, the morphology is consistent with spindle cell type (type A). The case was reviewed with Dr. Raynald Blend who concurs. (CR:kh 12/09/11) [place] RUND DO Pathologist, Electronic Signature   Impression:  The patient has a large spindle cell thymoma adjacent to his left mediastinum. He is 78 years old but in relatively good shape and I think he would tolerate surgical resection. This tumor does not appear to be actively invading the mediastinal or chest structures on CT scan and is most likely amenable to surgical resection. I agree with [name] that surgical resection is the only chance for a cure. I discussed the operative procedure with the patient and his daughter. We discussed alternatives, benefits, and risks including but not limited to bleeding, blood transfusion, infection, incomplete resection, recurrence of the tumor, and injury to mediastinal or chest structures. They understand and agree to proceed. They also asked whether his gallbladder could be removed at the same time but I don't think that would be a good idea.  Plan:  We have scheduled left thoracotomy for resection of the thymoma on Friday, 01/14/2012.

## 2011-12-24 NOTE — Telephone Encounter (Signed)
Jose Crawford called to report she plans to go out once a week for 2 more weeks to assist with drain management and  wound care.

## 2011-12-27 ENCOUNTER — Other Ambulatory Visit: Payer: Self-pay

## 2011-12-27 DIAGNOSIS — R222 Localized swelling, mass and lump, trunk: Secondary | ICD-10-CM

## 2011-12-29 ENCOUNTER — Ambulatory Visit (INDEPENDENT_AMBULATORY_CARE_PROVIDER_SITE_OTHER): Payer: Medicare Other | Admitting: General Surgery

## 2011-12-29 ENCOUNTER — Encounter (INDEPENDENT_AMBULATORY_CARE_PROVIDER_SITE_OTHER): Payer: Self-pay | Admitting: General Surgery

## 2011-12-29 VITALS — BP 116/70 | HR 86 | Temp 97.7°F | Resp 16 | Ht 73.5 in | Wt 170.0 lb

## 2011-12-29 DIAGNOSIS — K8000 Calculus of gallbladder with acute cholecystitis without obstruction: Secondary | ICD-10-CM

## 2011-12-29 NOTE — Progress Notes (Signed)
Subjective:     Patient ID: Jose Crawford, male   DOB: Sep 28, 1925, 78 y.o.   MRN: [id number]  HPI 78 year old Caucasian male who was initially met on May 9 in the hospital with acute cholecystitis. There was a question of emphysematous cholecystitis so a CT scan of his abdomen was performed which demonstrated no emphysematous cholecystitis; however, it did demonstrate a large mass adjacent to his left mediastinum. Therefore he underwent percutaneous cholecystostomy tube placement. He has subsequently undergone extensive workup for his large mediastinal mass. 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 He is scheduled for a left thoracotomy on June 21. He states he is doing well. He denies any fevers or chills. He denies any abdominal pain except where the tube enters the skin. He reports an improving appetite. He states that his energy level is improving each week.  He denies any diarrhea. He is having a bowel movement at least every other day. The drain is draining green fluid per the family and they empty about an 1.5 ounces three times a day.    Past Medical History  Diagnosis Date  . Basal cell carcinoma   . Prostate cancer   . Thymoma   . Acute cholecystitis 11/2011    s/p perc drain 12/03/11   PMHx, PSHx, SOCHx, FAMHx, ALL reviewed and unchanged   Review of Systems Negative except for above    Objective:   Physical Exam BP 116/70  Pulse 86  Temp(Src) 97.7 F (36.5 C) (Temporal)  Resp 16  Ht 6' 1.5" (1.867 m)  Wt 170 lb (77.111 kg)  BMI 22.12 kg/m2  Gen: alert, NAD, non-toxic appearing, elderly appearing Pupils: equal, no scleral icterus Pulm: Lungs clear to auscultation, symmetric chest rise CV: regular rate and rhythm Abd: soft, nontender, nondistended. Perc drain - RUQ- drain site ok. Drain with bile. Ext: no edema, no calf tenderness Skin: no rash, no jaundice     Assessment:     78yo WM with acute cholecystitis s/p percutaneous  cholecystostomy tube on 5/10 and mediastinal mass c/w thymoma    Plan:     He appears to be doing well with respect to his acute cholecystitis. I do not recommend a concomitant cholecystectomy and thoracotomy during the same hospitalization. My recommendation is for him to proceed with his thoracotomy and then we can do a cholecystectomy several weeks later. While in the hospital for his thoracotomy, I would like to get a cholangiogram through his existing cholecystostomy tube to evaluate his biliary anatomy. I encouraged them to continue the current drain care. I also encouraged him to be a well-developed healthy diet. He will followup after his hospitalization for his thoracotomy  Mary Sella. Andrey Campanile, MD, FACS General, Bariatric, & Minimally Invasive Surgery [facility]  l

## 2011-12-29 NOTE — Patient Instructions (Signed)
Try to eat a well balanced healthy diet. Continue your current drain care

## 2011-12-30 ENCOUNTER — Encounter: Payer: Self-pay | Admitting: Dietician

## 2011-12-30 ENCOUNTER — Encounter (HOSPITAL_COMMUNITY): Payer: Self-pay | Admitting: Pharmacy Technician

## 2011-12-30 NOTE — Progress Notes (Signed)
Brief Out-patient Oncology Nutrition Note  Reason: Patient screened positive for nutrition risk for unintentional weight loss and decreased appetite.   Mr. Jose Crawford is an 78 year old patient of [name], diagnosed with malignant thymoma. Attempted to contact patient via telephone for positive nutrition risk. Left patient message to contact RD.   Height: 6'1" Weight: 170 lb.  BMI: 22.4 kg/m^2  Wt Readings from Last 10 Encounters:  12/29/11 170 lb (77.111 kg)  12/24/11 168 lb (76.204 kg)  12/21/11 168 lb 11.2 oz (76.522 kg)  12/07/11 176 lb 1.6 oz (79.878 kg)    RD available for nutrition needs.   Iven Finn [facility] [phone]

## 2011-12-31 ENCOUNTER — Inpatient Hospital Stay (HOSPITAL_COMMUNITY): Admission: RE | Admit: 2011-12-31 | Payer: Medicare Other | Source: Ambulatory Visit

## 2011-12-31 ENCOUNTER — Encounter (HOSPITAL_COMMUNITY): Payer: Self-pay | Admitting: *Deleted

## 2011-12-31 NOTE — Pre-Procedure Instructions (Signed)
20 Jose Crawford  12/31/2011   Your procedure is scheduled on:  Thursday, June20th.  Report to [facility] at 5:30 AM.  Call this number if you have problems the morning of surgery: [phone]   Remember:   Do not eat food:After Midnight.  May have clear liquids: up to 4 Hours before arrival. (1:30am)   Clear liquids include soda, tea, black coffee, apple or grape juice, broth.  Take these medicines the morning of surgery with A SIP OF WATER: Acetaminophen (Tylenol).    Do not wear jewelry, make-up or nail polish.  Do not wear lotions, powders, or perfumes. You may wear deodorant.  Do not shave 48 hours prior to surgery. Men may shave face and neck.  Do not bring valuables to the hospital.  Contacts, dentures or bridgework may not be worn into surgery.  Leave suitcase in the car. After surgery it may be brought to your room.  For patients admitted to the hospital, checkout time is 11:00 AM the day of discharge.   Patients discharged the day of surgery will not be allowed to drive home.  Name and phone number of your driver: NA  Special Instructions: CHG Shower Use Special Wash: 1/2 bottle night before surgery and 1/2 bottle morning of surgery.   Please read over the following fact sheets that you were given: Pain Booklet, Coughing and Deep Breathing, Blood Transfusion Information, MRSA Information and Surgical Site Infection Prevention

## 2012-01-12 ENCOUNTER — Encounter (HOSPITAL_COMMUNITY)
Admission: RE | Admit: 2012-01-12 | Discharge: 2012-01-12 | Disposition: A | Discharge status: Home / Self Care | Payer: Medicare Other | Source: Ambulatory Visit | Attending: Surgery | Admitting: Surgery

## 2012-01-12 VITALS — BP 135/84 | HR 78 | Temp 97.4°F | Resp 18 | Ht 73.5 in | Wt 168.2 lb

## 2012-01-12 DIAGNOSIS — R222 Localized swelling, mass and lump, trunk: Secondary | ICD-10-CM

## 2012-01-12 LAB — PROTIME-INR: Prothrombin Time: 12.2 seconds (ref 11.6–15.2)

## 2012-01-12 LAB — COMPREHENSIVE METABOLIC PANEL
Alkaline Phosphatase: 66 U/L (ref 39–117)
Calcium: 11.1 mg/dL — ABNORMAL HIGH (ref 8.4–10.5)
Chloride: 102 mEq/L (ref 96–112)
Creatinine, Ser: 0.83 mg/dL (ref 0.50–1.35)
GFR calc Af Amer: >90 mL/min (ref >90)
GFR calc non Af Amer: 79 mL/min — ABNORMAL LOW (ref >90)
Sodium: 135 mEq/L (ref 135–145)
Total Bilirubin: 0.4 mg/dL (ref 0.3–1.2)
Total Protein: 6.7 g/dL (ref 6.0–8.3)

## 2012-01-12 LAB — CBC
MCV: 93.1 fL (ref 78.0–100.0)
Platelets: 340 10*3/uL (ref 150–400)
RDW: 14.4 % (ref 11.5–15.5)
WBC: 8.7 10*3/uL (ref 4.0–10.5)

## 2012-01-12 LAB — URINALYSIS, ROUTINE W REFLEX MICROSCOPIC
Glucose, UA: NEGATIVE mg/dL
Hgb urine dipstick: NEGATIVE
Specific Gravity, Urine: 1.03 (ref 1.005–1.030)
pH: 5.5 (ref 5.0–8.0)

## 2012-01-12 LAB — SURGICAL PCR SCREEN
MRSA, PCR: NEGATIVE
Staphylococcus aureus: POSITIVE — AB

## 2012-01-12 LAB — BLOOD GAS, ARTERIAL
Acid-base deficit: 1.8 mmol/L (ref 0.0–2.0)
Drawn by: 344381
FIO2: 0.21 %
pCO2 arterial: 32.9 mmHg — ABNORMAL LOW (ref 35.0–45.0)
pH, Arterial: 7.436 (ref 7.350–7.450)
pO2, Arterial: 95.2 mmHg (ref 80.0–100.0)

## 2012-01-12 LAB — ABO/RH: ABO/RH(D): O POS

## 2012-01-12 LAB — TYPE AND SCREEN: Antibody Screen: NEGATIVE

## 2012-01-12 LAB — APTT: aPTT: 31 seconds (ref 24–37)

## 2012-01-12 MED ORDER — DEXTROSE 5 % IV SOLN
1.5000 g | INTRAVENOUS | Status: AC
  Administered 2012-01-13: 1.5 g via INTRAVENOUS
  Filled 2012-01-12: qty 1.5

## 2012-01-13 ENCOUNTER — Inpatient Hospital Stay (HOSPITAL_COMMUNITY)
Admission: RE | Admit: 2012-01-13 | Discharge: 2012-01-20 | DRG: 827 | Disposition: A | Discharge status: Skilled Nursing Facility | Payer: Medicare Other | Source: Ambulatory Visit | Attending: Surgery | Admitting: Surgery

## 2012-01-13 ENCOUNTER — Encounter (HOSPITAL_COMMUNITY)
Admission: RE | Disposition: A | Discharge status: Skilled Nursing Facility | Payer: Self-pay | Source: Ambulatory Visit | Attending: Surgery

## 2012-01-13 ENCOUNTER — Telehealth (INDEPENDENT_AMBULATORY_CARE_PROVIDER_SITE_OTHER): Payer: Self-pay | Admitting: General Surgery

## 2012-01-13 ENCOUNTER — Encounter (HOSPITAL_COMMUNITY): Payer: Self-pay | Admitting: Certified Registered Nurse Anesthetist

## 2012-01-13 ENCOUNTER — Ambulatory Visit (HOSPITAL_COMMUNITY): Payer: Medicare Other | Admitting: Certified Registered Nurse Anesthetist

## 2012-01-13 ENCOUNTER — Encounter (HOSPITAL_COMMUNITY): Payer: Self-pay | Admitting: *Deleted

## 2012-01-13 ENCOUNTER — Inpatient Hospital Stay (HOSPITAL_COMMUNITY): Payer: Medicare Other

## 2012-01-13 DIAGNOSIS — D382 Neoplasm of uncertain behavior of pleura: Secondary | ICD-10-CM

## 2012-01-13 DIAGNOSIS — N320 Bladder-neck obstruction: Secondary | ICD-10-CM | POA: Diagnosis present

## 2012-01-13 DIAGNOSIS — Z87891 Personal history of nicotine dependence: Secondary | ICD-10-CM

## 2012-01-13 DIAGNOSIS — D384 Neoplasm of uncertain behavior of thymus: Secondary | ICD-10-CM

## 2012-01-13 DIAGNOSIS — D62 Acute posthemorrhagic anemia: Secondary | ICD-10-CM | POA: Diagnosis not present

## 2012-01-13 DIAGNOSIS — C37 Malignant neoplasm of thymus: Principal | ICD-10-CM | POA: Diagnosis present

## 2012-01-13 DIAGNOSIS — R222 Localized swelling, mass and lump, trunk: Secondary | ICD-10-CM

## 2012-01-13 DIAGNOSIS — K5900 Constipation, unspecified: Secondary | ICD-10-CM | POA: Diagnosis present

## 2012-01-13 DIAGNOSIS — F0390 Unspecified dementia without behavioral disturbance: Secondary | ICD-10-CM | POA: Diagnosis present

## 2012-01-13 DIAGNOSIS — D383 Neoplasm of uncertain behavior of mediastinum: Secondary | ICD-10-CM

## 2012-01-13 DIAGNOSIS — Z8546 Personal history of malignant neoplasm of prostate: Secondary | ICD-10-CM

## 2012-01-13 DIAGNOSIS — R339 Retention of urine, unspecified: Secondary | ICD-10-CM | POA: Diagnosis present

## 2012-01-13 HISTORY — DX: Unspecified injury of head, initial encounter: S09.90XA

## 2012-01-13 HISTORY — DX: Depression, unspecified: F32.A

## 2012-01-13 HISTORY — PX: THORACOTOMY: SHX5074

## 2012-01-13 HISTORY — DX: Major depressive disorder, single episode, unspecified: F32.9

## 2012-01-13 HISTORY — DX: Unspecified dementia, unspecified severity, without behavioral disturbance, psychotic disturbance, mood disturbance, and anxiety: F03.90

## 2012-01-13 HISTORY — DX: Cerebral infarction, unspecified: I63.9

## 2012-01-13 HISTORY — DX: Unspecified osteoarthritis, unspecified site: M19.90

## 2012-01-13 LAB — GLUCOSE, CAPILLARY
Glucose-Capillary: 123 mg/dL — ABNORMAL HIGH (ref 70–99)
Glucose-Capillary: 131 mg/dL — ABNORMAL HIGH (ref 70–99)

## 2012-01-13 SURGERY — THORACOTOMY, MAJOR
Anesthesia: General | Site: Chest | Laterality: Left

## 2012-01-13 MED ORDER — LEVALBUTEROL HCL 0.63 MG/3ML IN NEBU
0.6300 mg | INHALATION_SOLUTION | Freq: Four times a day (QID) | RESPIRATORY_TRACT | Status: DC
  Administered 2012-01-13 – 2012-01-17 (×14): 0.63 mg via RESPIRATORY_TRACT
  Filled 2012-01-13 (×20): qty 3

## 2012-01-13 MED ORDER — ONDANSETRON HCL 4 MG/2ML IJ SOLN
INTRAMUSCULAR | Status: DC | PRN
  Administered 2012-01-13: 4 mg via INTRAVENOUS

## 2012-01-13 MED ORDER — NALOXONE HCL 0.4 MG/ML IJ SOLN: 0.4000 mg | INTRAMUSCULAR | Status: DC | PRN

## 2012-01-13 MED ORDER — MIDAZOLAM HCL 5 MG/5ML IJ SOLN
INTRAMUSCULAR | Status: DC | PRN
  Administered 2012-01-13: 1 mg via INTRAVENOUS

## 2012-01-13 MED ORDER — ACETAMINOPHEN 10 MG/ML IV SOLN
1000.0000 mg | Freq: Four times a day (QID) | INTRAVENOUS | Status: AC
  Administered 2012-01-13 – 2012-01-14 (×4): 1000 mg via INTRAVENOUS
  Filled 2012-01-13 (×4): qty 100

## 2012-01-13 MED ORDER — OXYCODONE-ACETAMINOPHEN 5-325 MG PO TABS
1.0000 | ORAL_TABLET | ORAL | Status: DC | PRN
  Administered 2012-01-14: 1 via ORAL
  Administered 2012-01-14: 2 via ORAL
  Administered 2012-01-15 (×2): 1 via ORAL
  Administered 2012-01-15 – 2012-01-16 (×2): 2 via ORAL
  Administered 2012-01-16: 1 via ORAL
  Administered 2012-01-16 – 2012-01-18 (×8): 2 via ORAL
  Filled 2012-01-13 (×6): qty 2
  Filled 2012-01-13: qty 1
  Filled 2012-01-13 (×2): qty 2
  Filled 2012-01-13 (×2): qty 1
  Filled 2012-01-13 (×4): qty 2

## 2012-01-13 MED ORDER — ONDANSETRON HCL 4 MG/2ML IJ SOLN: 4.0000 mg | Freq: Four times a day (QID) | INTRAMUSCULAR | Status: DC | PRN

## 2012-01-13 MED ORDER — MUPIROCIN 2 % EX OINT
TOPICAL_OINTMENT | CUTANEOUS | Status: AC
  Filled 2012-01-13: qty 22

## 2012-01-13 MED ORDER — MORPHINE SULFATE 2 MG/ML IJ SOLN
1.0000 mg | INTRAMUSCULAR | Status: DC | PRN
  Administered 2012-01-13 – 2012-01-15 (×6): 1 mg via INTRAVENOUS
  Filled 2012-01-13 (×4): qty 1

## 2012-01-13 MED ORDER — DEXTROSE 5 % IV SOLN
1.5000 g | Freq: Two times a day (BID) | INTRAVENOUS | Status: AC
  Administered 2012-01-13 – 2012-01-14 (×2): 1.5 g via INTRAVENOUS
  Filled 2012-01-13 (×2): qty 1.5

## 2012-01-13 MED ORDER — INSULIN ASPART 100 UNIT/ML ~~LOC~~ SOLN
0.0000 [IU] | SUBCUTANEOUS | Status: DC
  Administered 2012-01-15: 2 [IU] via SUBCUTANEOUS

## 2012-01-13 MED ORDER — NEOSTIGMINE METHYLSULFATE 1 MG/ML IJ SOLN
INTRAMUSCULAR | Status: DC | PRN
  Administered 2012-01-13: 4 mg via INTRAVENOUS

## 2012-01-13 MED ORDER — SENNOSIDES-DOCUSATE SODIUM 8.6-50 MG PO TABS
1.0000 | ORAL_TABLET | Freq: Every evening | ORAL | Status: DC | PRN
  Filled 2012-01-13: qty 1

## 2012-01-13 MED ORDER — VANCOMYCIN HCL IN DEXTROSE 1-5 GM/200ML-% IV SOLN
1000.0000 mg | Freq: Two times a day (BID) | INTRAVENOUS | Status: AC
  Administered 2012-01-13: 1000 mg via INTRAVENOUS
  Filled 2012-01-13: qty 200

## 2012-01-13 MED ORDER — BUPIVACAINE ON-Q PAIN PUMP (FOR ORDER SET NO CHG)
INJECTION | Status: AC
  Filled 2012-01-13: qty 1

## 2012-01-13 MED ORDER — BUPIVACAINE 0.5 % ON-Q PUMP SINGLE CATH 400 ML
400.0000 mL | INJECTION | Status: AC
  Filled 2012-01-13: qty 400

## 2012-01-13 MED ORDER — HYDROMORPHONE HCL PF 1 MG/ML IJ SOLN
INTRAMUSCULAR | Status: AC
  Filled 2012-01-13: qty 1

## 2012-01-13 MED ORDER — PHENYLEPHRINE HCL 10 MG/ML IJ SOLN
INTRAMUSCULAR | Status: DC | PRN
  Administered 2012-01-13 (×2): 120 ug via INTRAVENOUS

## 2012-01-13 MED ORDER — DIPHENHYDRAMINE HCL 12.5 MG/5ML PO ELIX
12.5000 mg | ORAL_SOLUTION | Freq: Four times a day (QID) | ORAL | Status: DC | PRN
  Filled 2012-01-13: qty 5

## 2012-01-13 MED ORDER — LACTATED RINGERS IV SOLN
INTRAVENOUS | Status: DC | PRN
  Administered 2012-01-13: 07:00:00 via INTRAVENOUS

## 2012-01-13 MED ORDER — MUPIROCIN 2 % EX OINT
TOPICAL_OINTMENT | Freq: Once | CUTANEOUS | Status: AC
  Administered 2012-01-13: 06:00:00 via NASAL
  Filled 2012-01-13: qty 22

## 2012-01-13 MED ORDER — PROPOFOL 10 MG/ML IV EMUL
INTRAVENOUS | Status: DC | PRN
  Administered 2012-01-13: 50 mg via INTRAVENOUS

## 2012-01-13 MED ORDER — OXYCODONE HCL 5 MG PO TABS
5.0000 mg | ORAL_TABLET | ORAL | Status: AC | PRN
  Administered 2012-01-13 – 2012-01-14 (×4): 10 mg via ORAL
  Filled 2012-01-13 (×3): qty 2

## 2012-01-13 MED ORDER — ROCURONIUM BROMIDE 100 MG/10ML IV SOLN
INTRAVENOUS | Status: DC | PRN
  Administered 2012-01-13: 50 mg via INTRAVENOUS
  Administered 2012-01-13: 30 mg via INTRAVENOUS
  Administered 2012-01-13: 10 mg via INTRAVENOUS

## 2012-01-13 MED ORDER — TRAMADOL HCL 50 MG PO TABS
50.0000 mg | ORAL_TABLET | Freq: Four times a day (QID) | ORAL | Status: DC | PRN
  Administered 2012-01-13: 50 mg via ORAL
  Administered 2012-01-14 – 2012-01-17 (×4): 100 mg via ORAL
  Administered 2012-01-18 (×3): 50 mg via ORAL
  Administered 2012-01-18: 100 mg via ORAL
  Administered 2012-01-19: 50 mg via ORAL
  Administered 2012-01-19: 100 mg via ORAL
  Administered 2012-01-19 – 2012-01-20 (×2): 50 mg via ORAL
  Filled 2012-01-13 (×2): qty 2
  Filled 2012-01-13 (×2): qty 1
  Filled 2012-01-13: qty 2
  Filled 2012-01-13: qty 1
  Filled 2012-01-13: qty 2
  Filled 2012-01-13 (×3): qty 1
  Filled 2012-01-13: qty 2
  Filled 2012-01-13: qty 1
  Filled 2012-01-13: qty 2

## 2012-01-13 MED ORDER — BUPIVACAINE HCL (PF) 0.5 % IJ SOLN
INTRAMUSCULAR | Status: DC | PRN
  Administered 2012-01-13: 5 mL

## 2012-01-13 MED ORDER — FENTANYL CITRATE 0.05 MG/ML IJ SOLN
INTRAMUSCULAR | Status: DC | PRN
  Administered 2012-01-13 (×5): 50 ug via INTRAVENOUS

## 2012-01-13 MED ORDER — BUPIVACAINE 0.5 % ON-Q PUMP SINGLE CATH 400 ML
INJECTION | Status: DC | PRN
  Administered 2012-01-13: 400 mL

## 2012-01-13 MED ORDER — HYDROMORPHONE HCL PF 1 MG/ML IJ SOLN
0.2500 mg | INTRAMUSCULAR | Status: DC | PRN
  Administered 2012-01-13 (×4): 0.5 mg via INTRAVENOUS

## 2012-01-13 MED ORDER — BUPIVACAINE HCL (PF) 0.5 % IJ SOLN
INTRAMUSCULAR | Status: AC
  Filled 2012-01-13: qty 30

## 2012-01-13 MED ORDER — SODIUM CHLORIDE 0.9 % IJ SOLN: 9.0000 mL | INTRAMUSCULAR | Status: DC | PRN

## 2012-01-13 MED ORDER — POTASSIUM CHLORIDE 10 MEQ/50ML IV SOLN
10.0000 meq | Freq: Every day | INTRAVENOUS | Status: DC | PRN
  Filled 2012-01-13: qty 50

## 2012-01-13 MED ORDER — HEMOSTATIC AGENTS (NO CHARGE) OPTIME
TOPICAL | Status: DC | PRN
  Administered 2012-01-13: 1 via TOPICAL

## 2012-01-13 MED ORDER — MORPHINE SULFATE (PF) 1 MG/ML IV SOLN: INTRAVENOUS | Status: DC

## 2012-01-13 MED ORDER — GLYCOPYRROLATE 0.2 MG/ML IJ SOLN
INTRAMUSCULAR | Status: DC | PRN
  Administered 2012-01-13: 0.6 mg via INTRAVENOUS

## 2012-01-13 MED ORDER — OXYCODONE HCL 5 MG PO TABS
ORAL_TABLET | ORAL | Status: AC
  Filled 2012-01-13: qty 2

## 2012-01-13 MED ORDER — 0.9 % SODIUM CHLORIDE (POUR BTL) OPTIME
TOPICAL | Status: DC | PRN
  Administered 2012-01-13: 1000 mL

## 2012-01-13 MED ORDER — BISACODYL 5 MG PO TBEC
10.0000 mg | DELAYED_RELEASE_TABLET | Freq: Every day | ORAL | Status: DC
  Administered 2012-01-14 – 2012-01-20 (×6): 10 mg via ORAL
  Filled 2012-01-13 (×7): qty 2

## 2012-01-13 MED ORDER — DIPHENHYDRAMINE HCL 50 MG/ML IJ SOLN: 12.5000 mg | Freq: Four times a day (QID) | INTRAMUSCULAR | Status: DC | PRN

## 2012-01-13 MED ORDER — KCL IN DEXTROSE-NACL 20-5-0.45 MEQ/L-%-% IV SOLN
INTRAVENOUS | Status: AC
  Filled 2012-01-13: qty 1000

## 2012-01-13 MED ORDER — KCL IN DEXTROSE-NACL 20-5-0.45 MEQ/L-%-% IV SOLN
INTRAVENOUS | Status: DC
  Administered 2012-01-13 – 2012-01-15 (×2): via INTRAVENOUS
  Filled 2012-01-13 (×8): qty 1000

## 2012-01-13 SURGICAL SUPPLY — 59 items
CANISTER SUCTION 2500CC (MISCELLANEOUS) ×2 IMPLANT
CATH KIT ON Q 5IN SLV (PAIN MANAGEMENT) ×2 IMPLANT
CATH THORACIC 28FR (CATHETERS) ×2 IMPLANT
CATH THORACIC 36FR (CATHETERS) IMPLANT
CATH THORACIC 36FR RT ANG (CATHETERS) IMPLANT
CLIP TI MEDIUM 24 (CLIP) ×4 IMPLANT
CLOTH BEACON ORANGE TIMEOUT ST (SAFETY) ×2 IMPLANT
CONN ST 1/4X3/8  BEN (MISCELLANEOUS) ×1
CONN ST 1/4X3/8 BEN (MISCELLANEOUS) ×1 IMPLANT
CONT SPEC 4OZ CLIKSEAL STRL BL (MISCELLANEOUS) ×2 IMPLANT
COVER SURGICAL LIGHT HANDLE (MISCELLANEOUS) ×2 IMPLANT
DRAIN CHANNEL 32F RND 10.7 FF (WOUND CARE) ×2 IMPLANT
DRAPE LAPAROSCOPIC ABDOMINAL (DRAPES) ×2 IMPLANT
DRAPE SLUSH MACHINE 52X66 (DRAPES) ×2 IMPLANT
DRAPE SLUSH/WARMER DISC (DRAPES) IMPLANT
ELECT BLADE 6.5 EXT (BLADE) ×2 IMPLANT
ELECT REM PT RETURN 9FT ADLT (ELECTROSURGICAL) ×2
ELECTRODE REM PT RTRN 9FT ADLT (ELECTROSURGICAL) ×1 IMPLANT
GLOVE BIO SURGEON STRL SZ 6.5 (GLOVE) ×12 IMPLANT
GLOVE EUDERMIC 7 POWDERFREE (GLOVE) ×4 IMPLANT
GLOVE SURG SIGNA 7.5 PF LTX (GLOVE) ×2 IMPLANT
GOWN PREVENTION PLUS XLARGE (GOWN DISPOSABLE) ×2 IMPLANT
GOWN STRL NON-REIN LRG LVL3 (GOWN DISPOSABLE) ×8 IMPLANT
GOWN STRL REIN 2XL XLG LVL4 (GOWN DISPOSABLE) ×2 IMPLANT
KIT BASIN OR (CUSTOM PROCEDURE TRAY) ×2 IMPLANT
KIT ROOM TURNOVER OR (KITS) ×2 IMPLANT
NS IRRIG 1000ML POUR BTL (IV SOLUTION) ×4 IMPLANT
PACK CHEST (CUSTOM PROCEDURE TRAY) ×2 IMPLANT
PAD ARMBOARD 7.5X6 YLW CONV (MISCELLANEOUS) ×4 IMPLANT
PENCIL BUTTON HOLSTER BLD 10FT (ELECTRODE) ×2 IMPLANT
SEALANT PROGEL (MISCELLANEOUS) IMPLANT
SEALANT SURG COSEAL 4ML (VASCULAR PRODUCTS) ×2 IMPLANT
SEALANT SURG COSEAL 8ML (VASCULAR PRODUCTS) IMPLANT
SOLUTION ANTI FOG 6CC (MISCELLANEOUS) ×2 IMPLANT
SPONGE GAUZE 4X4 12PLY (GAUZE/BANDAGES/DRESSINGS) ×2 IMPLANT
SUT PROLENE 3 0 SH DA (SUTURE) ×2 IMPLANT
SUT SILK  1 MH (SUTURE) ×2
SUT SILK 1 MH (SUTURE) ×2 IMPLANT
SUT SILK 2 0SH CR/8 30 (SUTURE) ×2 IMPLANT
SUT SILK 3 0SH CR/8 30 (SUTURE) IMPLANT
SUT VIC AB 1 CTX 36 (SUTURE) ×2
SUT VIC AB 1 CTX36XBRD ANBCTR (SUTURE) ×2 IMPLANT
SUT VIC AB 2-0 CT1 27 (SUTURE)
SUT VIC AB 2-0 CT1 TAPERPNT 27 (SUTURE) IMPLANT
SUT VIC AB 2-0 CTX 36 (SUTURE) ×4 IMPLANT
SUT VIC AB 3-0 MH 27 (SUTURE) ×2 IMPLANT
SUT VIC AB 3-0 SH 27 (SUTURE)
SUT VIC AB 3-0 SH 27X BRD (SUTURE) IMPLANT
SUT VIC AB 3-0 X1 27 (SUTURE) ×4 IMPLANT
SUT VICRYL 2 TP 1 (SUTURE) ×4 IMPLANT
SYSTEM SAHARA CHEST DRAIN ATS (WOUND CARE) ×2 IMPLANT
TAPE CLOTH SURG 4X10 WHT LF (GAUZE/BANDAGES/DRESSINGS) ×2 IMPLANT
TIP APPLICATOR SPRAY EXTEND 16 (VASCULAR PRODUCTS) IMPLANT
TOWEL OR 17X24 6PK STRL BLUE (TOWEL DISPOSABLE) ×4 IMPLANT
TOWEL OR 17X26 10 PK STRL BLUE (TOWEL DISPOSABLE) ×4 IMPLANT
TRAP SPECIMEN MUCOUS 40CC (MISCELLANEOUS) IMPLANT
TRAY FOLEY CATH 14FRSI W/METER (CATHETERS) ×2 IMPLANT
TUNNELER SHEATH ON-Q 11GX8 (MISCELLANEOUS) ×2 IMPLANT
WATER STERILE IRR 1000ML POUR (IV SOLUTION) ×4 IMPLANT

## 2012-01-13 NOTE — Progress Notes (Signed)
S/p Thymectomy  BP 105/56  Pulse 72  Temp 97.9 F (36.6 C) (Oral)  Resp 14  SpO2 100%   Intake/Output Summary (Last 24 hours) at 01/13/12 1747 Last data filed at 01/13/12 1300  Gross per 24 hour  Intake   1200 ml  Output    450 ml  Net    750 ml    Postop labs Hct 40  Doing well early postop

## 2012-01-13 NOTE — Telephone Encounter (Signed)
Message copied by Liliana Cline on Thu Jan 13, 2012  4:51 PM ------      Message from: Parks Neptune      Created: Thu Jan 13, 2012  4:17 PM      Regarding: [facility]      Contact: [phone]       Annice Pih from [facility] needs orders to continue to see Mr. Langhorst.  The above number is Annice Pih (the nurse)

## 2012-01-13 NOTE — Anesthesia Procedure Notes (Signed)
Procedure Name: Intubation Date/Time: 01/13/2012 7:58 AM Performed by: Quentin Ore Pre-anesthesia Checklist: Patient identified, Emergency Drugs available, Suction available, Patient being monitored and Timeout performed Patient Re-evaluated:Patient Re-evaluated prior to inductionOxygen Delivery Method: Circle system utilized Preoxygenation: Pre-oxygenation with 100% oxygen Intubation Type: IV induction Ventilation: Mask ventilation without difficulty Laryngoscope Size: Mac and 4 Grade View: Grade I Endobronchial tube: Left, Double lumen EBT, EBT position confirmed by auscultation and EBT position confirmed by fiberoptic bronchoscope and 39 Fr Number of attempts: 1 Airway Equipment and Method: Stylet Placement Confirmation: positive ETCO2,  breath sounds checked- equal and bilateral and ETT inserted through vocal cords under direct vision Tube secured with: Tape Dental Injury: Teeth and Oropharynx as per pre-operative assessment

## 2012-01-13 NOTE — Progress Notes (Signed)
Dr Ivin Booty notified patient had sips of water from fountain this a.m. At 0530

## 2012-01-13 NOTE — H&P (Signed)
[address]            [place] [address]          [phone]      Jose Crawford is an 79 y.o. male.  HPI:  The patient is an 78 year old gentleman who was admitted with nausea, vomiting, abdominal pain and diagnosed with acute cholecystitis. His initial chest x-ray was not read as showing a lung mass but when he had an abdominal CT scan performed the uppermost cuts clearly showed a large mass adjacent to the left ventricle. His acute cholecystitis was treated with a percutaneous cholecystostomy tube with quick resolution of his leukocytosis. A complete chest CT scan showed a 6.2 x 9.3 x 8.7 cm mass adjacent to the left heart border. 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 He was discharged from the hospital and had a followup appointment with [name]. He felt the best treatment would be surgical resection if possible and the patient was referred back to me for consideration of that. He is here with his daughter today. He has been living with her for the past 4 years. She said that he has had some decline in his functioning over the past 4 years but is still fairly active and has had very few problems until his acute cholecystitis. He's been doing well since his recent discharge.  Past Medical History   Diagnosis  Date   .  Basal cell carcinoma    .  Prostate cancer     Past Surgical History   Procedure  Date   .  Prostatectomy    History reviewed. No pertinent family history.  Social History: reports that he was a remote smoker but quit in the 1950's. He does not have any smokeless tobacco history on file. He reports that he uses illicit drugs. He reports that he does not drink alcohol.  Allergies: No Known Allergies  Medications:  I have reviewed the patient's current medications.  Prior to Admission:  Prescriptions prior to admission   Medication  Sig  Dispense  Refill   .  acetaminophen (TYLENOL)  500 MG tablet  Take 1,000 mg by mouth every 6 (six) hours as needed. For pain     Scheduled:  .  antiseptic oral rinse  15 mL  Mouth Rinse  BID   .  pantoprazole (PROTONIX) IV  40 mg  Intravenous  QHS   .  piperacillin-tazobactam (ZOSYN) IV  3.375 g  Intravenous  Q8H   Continuous:  .  dextrose 5 % and 0.45 % NaCl with KCl 20 mEq/L  100 mL/hr at 12/04/11 1453   BJY:[id number], diphenhydrAMINE, iohexol, morphine injection, ondansetron  Results for orders placed during the hospital encounter of 12/02/11 (from the past 48 hour(s))   MRSA PCR SCREENING Status: Normal    Collection Time    12/02/11 11:36 PM   Component  Value  Range  Comment    MRSA by PCR  NEGATIVE  NEGATIVE    COMPREHENSIVE METABOLIC PANEL Status: Abnormal    Collection Time    12/03/11 4:20 AM   Component  Value  Range  Comment    Sodium  131 (*)  135 - 145 (mEq/L)     Potassium  3.9  3.5 - 5.1 (mEq/L)     Chloride  101  96 - 112 (mEq/L)  CO2  22  19 - 32 (mEq/L)     Glucose, Bld  112 (*)  70 - 99 (mg/dL)     BUN  15  6 - 23 (mg/dL)     Creatinine, Ser  1.61  0.50 - 1.35 (mg/dL)     Calcium  09.6  8.4 - 10.5 (mg/dL)     Total Protein  5.6 (*)  6.0 - 8.3 (g/dL)     Albumin  2.9 (*)  3.5 - 5.2 (g/dL)     AST  25  0 - 37 (U/L)     ALT  10  0 - 53 (U/L)     Alkaline Phosphatase  62  39 - 117 (U/L)     Total Bilirubin  0.9  0.3 - 1.2 (mg/dL)     GFR calc non Af Amer  74 (*)  >90 (mL/min)     GFR calc Af Amer  85 (*)  >90 (mL/min)    MAGNESIUM Status: Normal    Collection Time    12/03/11 4:20 AM   Component  Value  Range  Comment    Magnesium  1.6  1.5 - 2.5 (mg/dL)    PHOSPHORUS Status: Abnormal    Collection Time    12/03/11 4:20 AM   Component  Value  Range  Comment    Phosphorus  1.8 (*)  2.3 - 4.6 (mg/dL)    CBC Status: Abnormal    Collection Time    12/03/11 4:20 AM   Component  Value  Range  Comment    WBC  17.3 (*)  4.0 - 10.5 (K/uL)     RBC  3.87 (*)  4.22 - 5.81 (MIL/uL)     Hemoglobin  12.0 (*)   13.0 - 17.0 (g/dL)     HCT  04.5 (*)  40.9 - 52.0 (%)     MCV  92.0  78.0 - 100.0 (fL)     MCH  31.0  26.0 - 34.0 (pg)     MCHC  33.7  30.0 - 36.0 (g/dL)     RDW  81.1  91.4 - 15.5 (%)     Platelets  196  150 - 400 (K/uL)    BODY FLUID CULTURE Status: Normal (Preliminary result)    Collection Time    12/03/11 1:58 PM   Component  Value  Range  Comment    Specimen Description  FLUID GALL BLADDER      Special Requests  NONE      Gram Stain       Value:  NO WBC SEEN     MODERATE GRAM POSITIVE COCCI IN PAIRS     IN CLUSTERS    Culture  Culture reincubated for better growth      Report Status  PENDING     PSA Status: Abnormal    Collection Time    12/03/11 2:58 PM   Component  Value  Range  Comment    PSA  <0.01 (*)  <=4.00 (ng/mL)    CEA Status: Normal    Collection Time    12/03/11 2:58 PM   Component  Value  Range  Comment    CEA  <0.5  0.0 - 5.0 (ng/mL)    COMPREHENSIVE METABOLIC PANEL Status: Abnormal    Collection Time    12/04/11 4:22 AM   Component  Value  Range  Comment    Sodium  133 (*)  135 - 145 (mEq/L)     Potassium  4.0  3.5 - 5.1 (mEq/L)     Chloride  103  96 - 112 (mEq/L)     CO2  23  19 - 32 (mEq/L)     Glucose, Bld  109 (*)  70 - 99 (mg/dL)     BUN  11  6 - 23 (mg/dL)     Creatinine, Ser  1.61  0.50 - 1.35 (mg/dL)     Calcium  09.6 (*)  8.4 - 10.5 (mg/dL)     Total Protein  5.6 (*)  6.0 - 8.3 (g/dL)     Albumin  2.7 (*)  3.5 - 5.2 (g/dL)     AST  22  0 - 37 (U/L)     ALT  13  0 - 53 (U/L)     Alkaline Phosphatase  85  39 - 117 (U/L)     Total Bilirubin  0.8  0.3 - 1.2 (mg/dL)     GFR calc non Af Amer  66 (*)  >90 (mL/min)     GFR calc Af Amer  77 (*)  >90 (mL/min)    CBC Status: Abnormal    Collection Time    12/04/11 4:22 AM   Component  Value  Range  Comment    WBC  10.9 (*)  4.0 - 10.5 (K/uL)     RBC  3.71 (*)  4.22 - 5.81 (MIL/uL)     Hemoglobin  11.5 (*)  13.0 - 17.0 (g/dL)     HCT  04.5 (*)  40.9 - 52.0 (%)     MCV  92.5  78.0 - 100.0 (fL)      MCH  31.0  26.0 - 34.0 (pg)     MCHC  33.5  30.0 - 36.0 (g/dL)     RDW  81.1  91.4 - 15.5 (%)     Platelets  195  150 - 400 (K/uL)    DIFFERENTIAL Status: Abnormal    Collection Time    12/04/11 4:22 AM   Component  Value  Range  Comment    Neutrophils Relative  80 (*)  43 - 77 (%)     Neutro Abs  8.7 (*)  1.7 - 7.7 (K/uL)     Lymphocytes Relative  10 (*)  12 - 46 (%)     Lymphs Abs  1.1  0.7 - 4.0 (K/uL)     Monocytes Relative  7  3 - 12 (%)     Monocytes Absolute  0.8  0.1 - 1.0 (K/uL)     Eosinophils Relative  3  0 - 5 (%)     Eosinophils Absolute  0.3  0.0 - 0.7 (K/uL)     Basophils Relative  0  0 - 1 (%)     Basophils Absolute  0.0  0.0 - 0.1 (K/uL)    Ct Chest W Contrast  12/03/2011 *RADIOLOGY REPORT* Clinical Data: Chest mass. CT CHEST WITH CONTRAST Technique: Multidetector CT imaging of the chest was performed following the standard protocol during bolus administration of intravenous contrast. Contrast: 80mL OMNIPAQUE IOHEXOL 300 MG/ML SOLN Comparison: CT abdomen and pelvis 12/02/2011. Findings: There is a large mass immediately adjacent to the left heart border measuring 6.2 cm transverse by 9.3 cm AP by 8.7 cm cranial-caudal. It is contiguous with the left ventricle but there appears to be a fat plane between the lesion and the pulmonary outflow tract and left ventricle. The lesion appears to within the mediastinum and could be lymphoma or less likely thymoma. Bronchogenic  carcinoma cannot be excluded. The patient has a very small right pleural effusion and basilar airspace disease. Heart size is normal. Coronary and aortic atherosclerotic vascular disease is noted. Dependent atelectasis is seen on the right. Incidentally imaged upper abdomen shows partial visualization of a drain within the gallbladder. No focal bony abnormality. IMPRESSION: Large mass along the left heart border appears to be within the mediastinum and could be due to lymphoma or less likely thymoma. The lesion could be  within lung and due to bronchogenic carcinoma. Original Report Authenticated By: Bernadene Bell. Maricela Curet, M.D.  US Abdomen Complete  12/02/2011 *RADIOLOGY REPORT* Clinical Data: Right upper quadrant pain and fever ABDOMINAL ULTRASOUND COMPLETE Comparison: None. Findings: Gallbladder: There are multiple small gallstones and tumefactive sludge within the gallbladder lumen. There is gallbladder wall thickening and pericholecystic fluid. There is dirty shadowing from a portion of the gallbladder wall which is worrisome for gas. Common Bile Duct: Within normal limits in caliber. Measures 4 mm. Liver: No focal mass lesion identified. Within normal limits in parenchymal echogenicity. IVC: Appears normal. Pancreas: No abnormality identified. Spleen: Within normal limits in size and echotexture. Right kidney: Normal in size and parenchymal echogenicity. No evidence of mass or hydronephrosis. Left kidney: Normal in size and parenchymal echogenicity. No evidence of mass or hydronephrosis. Abdominal Aorta: No aneurysm identified. IMPRESSION: Cholelithiasis with cholecystitis. Gas within a portion of the gallbladder wall is also suspected, consistent with emphysematous cholecystitis. Discussed with Dr. Manus Gunning. Original Report Authenticated By: Brandon Melnick, M.D.  Ct Abdomen Pelvis W Contrast  12/02/2011 *RADIOLOGY REPORT* Clinical Data: Short of breath. Pain all over. History of prostate cancer. CT ABDOMEN AND PELVIS WITH CONTRAST Technique: Multidetector CT imaging of the abdomen and pelvis was performed following the standard protocol during bolus administration of intravenous contrast. Contrast: OMNIPAQUE IOHEXOL 300 MG/ML SOLN Comparison: 12/02/2011 abdominal ultrasound. Findings: Lung Bases: There is a mass in the inferior left chest abutting the pericardium. Fat plane between the mass and the left ventricle is effaced. Severe coronary artery atherosclerosis is present. The dependent atelectasis is present in the right  lung. Liver: Tiny left hepatic lobe low density lesions probably representing cysts or hemangioma. No mass lesion. Small diaphragmatic lymph node incidentally noted (image 20 series 2). Spleen: Normal. Gallbladder: Inflamed. Dependently layering high-density material most compatible with cholelithiasis. Pericholecystic fluid and fat stranding. Likely reactive mural thickening of the hepatic flexure of the colon. Common bile duct: No intra or extrahepatic biliary ductal dilation. No common duct stone. Pancreas: Normal. Adrenal glands: Mild thickening of the left adrenal gland, likely representing hyperplasia. Kidneys: Nonobstructing bilateral renal collecting system calculi. Normal renal enhancement. Largest cluster of calculi in the right inferior renal pole measuring 10 mm x 9 mm. 13 mm left interpolar simple renal cyst. Retroaortic left renal vein incidentally noted. Stomach: Within normal limits. Small bowel: No small bowel obstruction. Penile prosthesis reservoir in the right lower quadrant. Colon: Diverticulosis. Inflammatory changes of the hepatic flexure. Pelvic Genitourinary: Urinary bladder appears within normal limits. Surgical clips in the anatomic pelvis compatible with prostatectomy. Tubing is present in the right inguinal region compatible with a prosthesis. Bones: Lumbar spondylosis and scoliosis. Degenerative endplate changes. Lower lumbar facet arthrosis. No aggressive osseous lesions are identified. There is no retroperitoneal adenopathy. No inguinal adenopathy. Vasculature: There are right iliac atherosclerosis without aneurysm. No acute vascular abnormality. IMPRESSION: 1. Acute cholecystitis with inflammatory changes extending to the hepatic flexure of the colon. Cholelithiasis. 2. Nephrolithiasis. No hydronephrosis. Renal cyst. 3. Atherosclerosis and coronary  artery disease. 4. 9.5 cm x 6.8 cm mass is partially visualized in the inferior chest. This would be atypical presentation for metastatic  prostate cancer in the absence of bone lesions or retroperitoneal adenopathy. Primary bronchogenic carcinoma is the top consideration although the appearance is nonspecific. Original Report Authenticated By: Andreas Newport, M.D.  Ir Perc Cholecystostomy  12/03/2011 *RADIOLOGY REPORT* Clinical Data: Acute cholecystitis, nonoperative candidate ULTRASOUND FLUOROSCOPIC PERCUTANEOUS TRANSHEPATIC CHOLECYSTOSTOMY Date: 12/03/2011 14:15:00 Radiologist: M. Ruel Favors, M.D. Medications: The patient is already receiving daily antibiotics, 1% lidocaine locally. Conscious sedation was not necessary. Guidance: Ultrasound fluoroscopic Fluoroscopy time: 0.5 minutes Sedation time: None. Contrast volume: 5 ml Omnipaque-300 Complications: No immediate PROCEDURE/FINDINGS: Informed consent was obtained from the patient following explanation of the procedure, risks, benefits and alternatives. The patient understands, agrees and consents for the procedure. All questions were addressed. A time out was performed. Maximal barrier sterile technique utilized including caps, mask, sterile gowns, sterile gloves, large sterile drape, hand hygiene, and betadine Previous CT reviewed. Acute calculus cholecystitis noted. Preliminary ultrasound performed demonstrating a distended thick walled gallbladder containing gallstones. Under sterile conditions and local anesthesia, a percutaneous access needle was advanced from a right upper quadrant transhepatic approach into the gallbladder with ultrasound. Images obtained for documentation. There was return of bile. Guide wire advanced followed by the Accustick dilator. This allowed exchange for an Amplatz guide wire. 10-French drain advanced over the Amplatz guide wire. Retention loop formed in the gallbladder. Syringe aspiration yielded 80 ml bile. Sample sent for Gram stain and culture. Contrast injection confirms position. Filling defects in the gallbladder noted compatible with gallstones. Catheter  secured with a Prolene suture and connected to external drainage. Sterile dressing applied. No immediate complication. The patient tolerated the procedure well. IMPRESSION: Successful ultrasound and fluoroscopic transhepatic 10-French cholecystostomy. Original Report Authenticated By: Judie Petit. Ruel Favors, M.D.   Review of Systems   Constitutional: Positive for malaise/fatigue and diaphoresis. Negative for fever, chills and weight loss.  HENT: Negative.  Eyes: Negative.  Respiratory: Negative for cough, hemoptysis and shortness of breath.  Cardiovascular: Positive for chest pain. Negative for palpitations, orthopnea, claudication, leg swelling and PND.  Gastrointestinal: Positive for nausea, vomiting, abdominal pain and diarrhea. Negative for blood in stool and melena.  Genitourinary: Negative.  Musculoskeletal: Negative.  Skin: Negative.  Neurological: Negative. Negative for weakness.  Endo/Heme/Allergies: Negative.  Psychiatric/Behavioral: Negative.   Blood pressure 129/69, pulse 73, temperature 98 F (36.7 C), temperature source Oral, resp. rate 18, height 6\' 1"  (1.854 m), weight 78.7 kg (173 lb 8 oz), SpO2 97.00%.   Physical Exam  Constitutional: He is oriented to person, place, and time. He appears well-developed and well-nourished. No distress.  HENT:  Head: Normocephalic and atraumatic.  Mouth/Throat: Oropharynx is clear and moist.  Eyes: Conjunctivae and EOM are normal. Pupils are equal, round, and reactive to light.  Neck: Neck supple. No JVD present. No tracheal deviation present. No thyromegaly present.  Cardiovascular: Regular rhythm, normal heart sounds and intact distal pulses. Exam reveals no gallop and no friction rub.  No murmur heard.  Respiratory: Effort normal and breath sounds normal. He exhibits no tenderness.  GI: Soft. Bowel sounds are normal. He exhibits no distension and no mass. There is no tenderness.  Musculoskeletal: Normal range of motion. He exhibits no  edema.  Lymphadenopathy:  He has no cervical adenopathy.  Neurological: He is alert and oriented to person, place, and time. He has normal strength. No cranial nerve deficit or sensory deficit.  Slow to respond  to questions. Acts like he has some degree of dementia.  Skin: Skin is warm and dry.  Psychiatric: His affect is blunt. His speech is delayed. He is slowed.    CT CHEST WITH CONTRAST  Technique: Multidetector CT imaging of the chest was performed  following the standard protocol during bolus administration of  intravenous contrast.  Contrast: 80mL OMNIPAQUE IOHEXOL 300 MG/ML SOLN  Comparison: CT abdomen and pelvis 12/02/2011.  Findings: There is a large mass immediately adjacent to the left  heart border measuring 6.2 cm transverse by 9.3 cm AP by 8.7 cm  cranial-caudal. It is contiguous with the left ventricle but there  appears to be a fat plane between the lesion and the pulmonary  outflow tract and left ventricle. The lesion appears to within the  mediastinum and could be lymphoma or less likely thymoma.  Bronchogenic carcinoma cannot be excluded. The patient has a very  small right pleural effusion and basilar airspace disease. Heart  size is normal. Coronary and aortic atherosclerotic vascular  disease is noted. Dependent atelectasis is seen on the right.  Incidentally imaged upper abdomen shows partial visualization of a  drain within the gallbladder. No focal bony abnormality.  IMPRESSION:  Large mass along the left heart border appears to be within the  mediastinum and could be due to lymphoma or less likely thymoma.  The lesion could be within lung and due to bronchogenic carcinoma.  Original Report Authenticated By: Bernadene Bell. Maricela Curet, M.D.    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 He is 78 years old but in relatively good shape and I think he would tolerate surgical resection. This tumor does not appear  to be actively invading the mediastinal or chest structures on CT scan and is most likely amenable to surgical resection. I agree with [name] that surgical resection is the only chance for a cure. I discussed the operative procedure with the patient and his daughter. We discussed alternatives, benefits, and risks including but not limited to bleeding, blood transfusion, infection, incomplete resection, recurrence of the tumor, and injury to mediastinal or chest structures. They understand and agree to proceed. They also asked whether his gallbladder could be removed at the same time but I don't think that would be a good idea.  Plan:  We have scheduled left thoracotomy for resection of the thymoma on Friday, 01/14/2012.

## 2012-01-13 NOTE — Telephone Encounter (Signed)
Left message for Jackie to call back 

## 2012-01-13 NOTE — Progress Notes (Signed)
Per son, patient did not get Mupirocin started yesterday. Will start in [facility].

## 2012-01-13 NOTE — Brief Op Note (Signed)
01/13/2012  10:42 AM  PATIENT:  Jose Crawford  78 y.o. male  PRE-OPERATIVE DIAGNOSIS:  THYMOMA  POST-OPERATIVE DIAGNOSIS:  thymoma  PROCEDURE:  Procedure(s) (LRB): THORACOTOMY MAJOR (Left) Resection of Thymoma  SURGEON:  Surgeon(s) and Role:    * Alleen Borne, MD - Primary    * Ines Bloomer, MD - Assisting  PHYSICIAN ASSISTANT: Donielle Zimmerman,PA-C  ASSISTANTS: none   ANESTHESIA:   general  EBL:  Total I/O In: 1000 [I.V.:1000] Out: 200 [Urine:150; Blood:50]  BLOOD ADMINISTERED:none  DRAINS: 1 Chest Tube(s) in the left and (1) Blake drain(s) in the left pleural space   LOCAL MEDICATIONS USED:  NONE  SPECIMEN:  Source of Specimen:  mediastinal tumor  DISPOSITION OF SPECIMEN:  PATHOLOGY  COUNTS:  YES   PLAN OF CARE: Admit to inpatient   PATIENT DISPOSITION:  PACU - hemodynamically stable.   Delay start of Pharmacological VTE agent (>24hrs) due to surgical blood loss or risk of bleeding: yes

## 2012-01-13 NOTE — Anesthesia Postprocedure Evaluation (Signed)
  Anesthesia Post-op Note  Patient: Jose Crawford  Procedure(s) Performed: Procedure(s) (LRB): THORACOTOMY MAJOR (Left)  Patient Location: PACU  Anesthesia Type: General  Level of Consciousness: awake  Airway and Oxygen Therapy: Patient Spontanous Breathing and Patient connected to nasal cannula oxygen  Post-op Pain: moderate  Post-op Assessment: Post-op Vital signs reviewed, Patient's Cardiovascular Status Stable, Respiratory Function Stable, Patent Airway and No signs of Nausea or vomiting  Post-op Vital Signs: Reviewed and stable  Complications: No apparent anesthesia complications

## 2012-01-13 NOTE — Transfer of Care (Signed)
Immediate Anesthesia Transfer of Care Note  Patient: Jose Crawford  Procedure(s) Performed: Procedure(s) (LRB): THORACOTOMY MAJOR (Left)  Patient Location: PACU  Anesthesia Type: General  Level of Consciousness: awake  Airway & Oxygen Therapy: Patient Spontanous Breathing and Patient connected to face mask oxygen  Post-op Assessment: Report given to PACU RN, Post -op Vital signs reviewed and stable and Patient moving all extremities X 4  Post vital signs: Reviewed and stable  Complications: No apparent anesthesia complications

## 2012-01-13 NOTE — Preoperative (Signed)
Beta Blockers   Reason not to administer Beta Blockers:Not Applicable 

## 2012-01-13 NOTE — Op Note (Signed)
NAMEMarland Crawford  FITZHUGH, VIZCARRONDO NO.:  [id number]  MEDICAL RECORD NO.:  [id number]  LOCATION:  2314                         FACILITY:  [facility]  PHYSICIAN:  Evelene Croon, M.D.     DATE OF BIRTH:  09-18-1925  DATE OF PROCEDURE:  01/13/2012 DATE OF DISCHARGE:                              OPERATIVE REPORT   PREOPERATIVE DIAGNOSIS:  Thymoma.  POSTOPERATIVE DIAGNOSIS:  Thymoma.  PROCEDURE:  Left thoracotomy with resection of left anterior mediastinal thymoma.  ATTENDING SURGEON:  Evelene Croon, MD  ASSISTANT:  Ines Bloomer, MD  SECOND ASSISTANT:  Doree Fudge, PA-C  ANESTHESIA:  General endotracheal.  CLINICAL HISTORY:  This patient is an 78 year old gentleman who was admitted about a month or so ago with nausea, vomiting, and abdominal pain, was diagnosed with acute cholecystitis.  His initial chest x-ray was not read as showing a lung mass, but when he had an abdominal CT scan performed, the uppermost cuts clearly showed a large mass adjacent to the left ventricle.  His acute cholecystitis was treated with a percutaneous cholecystostomy tube with quick resolution of his leukocytosis.  He had a complete chest CT that showed a 6.2 x 9.3 x 8.7 cm mass adjacent to the left heart border.  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  He was discharged from the hospital, and was seen in followup by [name]. It was felt that this would be best treated with surgical resection if possible, and the patient was referred back to me for consideration of surgery.  After seeing the patient in the office and discussing surgical treatment with him and his daughter, I felt that he would be a surgical candidate.  He has had some decline in his functioning over the past 4 years, but is still fairly active and has had very few problems until his acute cholecystitis.  I felt that left thoracotomy would be the best approach since this  tumor was lying on the left side of the chest adjacent to the heart.  I discussed the operative procedure with the patient and his daughter including benefits and risks.  We discussed risks including, but not limited to bleeding, blood transfusion, infection, injury to the mediastinal or chest structures, incomplete resection or unresectability, and injury to the phrenic nerve.  He understood all of this and agreed to proceed.  OPERATIVE PROCEDURE:  The patient was seen in the preoperative holding area and the proper patient, proper operation, proper operative side were confirmed after reviewing the chart and CT scans and the discussion with the patient.  The consent was signed and the left side of the chest was signed by me.  Preoperative intravenous antibiotics were given.  He was taken back to the operating room and placed on the table in a supine position.  After induction of general endotracheal anesthesia using a double-lumen tube, a Foley catheter was placed in the bladder using sterile technique.  Lower extremity pneumatic compression devices were used.  The patient was then positioned in the right lateral decubitus position with the left side up.  Left side of the chest was prepped with Betadine soap and solution, draped  in usual sterile manner.  A left lateral thoracotomy incision was performed.  The pleural space was entered through the fifth intercostal space.  Chest retractors were placed.  Examination showed the large thymoma lying along the left mediastinal region.  There were adhesions present between the thymoma and the mediastinum and the left upper lobe of lung was draped over the tumor and adhered to it.  The phrenic nerve was identified along the inferior border of the tumor and was carefully dissected off the tumor posteriorly without difficulty.  Then the adhesions between the left upper lobe of the lung and the tumor were divided using electrocautery. The  adhesions between the tumor and the mediastinal pleura were then divided.  There were multiple small blood vessels entering the tumor and these were clipped and divided.  Superiorly, there was a large venous trunk that was leaving the tumor and this was ligated with silk sutures proximally and distally and divided.  There was also a large artery entering the tumor from what appeared to be the left internal mammary artery.  This was ligated proximally and distally and divided.  After all of the blood vessels entering the tumor were divided and the mediastinal attachments were separated, the tumor was easily removed. It did not appear to be invading any of the mediastinal or lung structures and appeared completely encapsulated.  It was passed off the table and sent to Pathology.  Examination of the chest showed complete hemostasis.  Estimated blood loss was only about 50 mL.  Then an On-Q catheter was inserted through a separate stab incision and tunneled in a subpleural location from below the thoracotomy level posterior to the thoracotomy incision and then 2 interspaces above the thoracotomy level. This was flushed with 5 mL of 0.5% Marcaine and connected to a pain ball with 0.5% Marcaine.  Then, a 28-French chest tube was brought through a separate stab incision and positioned anteriorly and a 32-French Blake drain was brought through a separate stab incision and positioned posteriorly.  The ribs were then reapproximated with #2 Vicryl pericostal sutures.  Before tying the sutures, the left lung was reinflated and appeared to completely inflate.  There were no visible air leaks.  The sutures were tied.  Then the muscles were closed with continuous #1 Vicryl suture.  The subcutaneous tissue was closed with continuous 2-0 Vicryl and the skin with a 3-0 Vicryl subcuticular closure.  The sponge, needle, and instrument counts were correct according to the scrub nurse.  Dry sterile dressing was  applied over the incision and around the chest tubes, which were Pleur-Evac suction.  The patient was then turned into the supine position, extubated, and transferred to the post anesthesia care unit in a satisfactory and stable condition.     Evelene Croon, M.D.     BB/MEDQ  D:  01/13/2012  T:  01/13/2012  Job:  [id number]

## 2012-01-13 NOTE — Anesthesia Preprocedure Evaluation (Addendum)
Anesthesia Evaluation  Patient identified by MRN, date of birth, ID band Patient awake, Patient confused and Patient unresponsive    Reviewed: Allergy & Precautions, H&P , NPO status , Patient's Chart, lab work & pertinent test results  Airway Mallampati: II TM Distance: >3 FB Neck ROM: Full    Dental  (+) Teeth Intact and Dental Advisory Given   Pulmonary  Thymoma breath sounds clear to auscultation        Cardiovascular Rhythm:Regular Rate:Normal     Neuro/Psych PSYCHIATRIC DISORDERS Depression CVA    GI/Hepatic   Endo/Other    Renal/GU      Musculoskeletal   Abdominal   Peds  Hematology   Anesthesia Other Findings   Reproductive/Obstetrics                         Anesthesia Physical Anesthesia Plan  ASA: III  Anesthesia Plan: General   Post-op Pain Management:    Induction: Intravenous  Airway Management Planned: Double Lumen EBT  Additional Equipment: Arterial line, CVP and Ultrasound Guidance Line Placement  Intra-op Plan:   Post-operative Plan: Extubation in OR and Possible Post-op intubation/ventilation  Informed Consent: I have reviewed the patients History and Physical, chart, labs and discussed the procedure including the risks, benefits and alternatives for the proposed anesthesia with the patient or authorized representative who has indicated his/her understanding and acceptance.   Dental advisory given  Plan Discussed with: Anesthesiologist and CRNA  Anesthesia Plan Comments:        Anesthesia Quick Evaluation

## 2012-01-13 NOTE — Interval H&P Note (Signed)
History and Physical Interval Note:  01/13/2012 7:03 AM  Jose Crawford  has presented today for surgery, with the diagnosis of THYMOMA  The various methods of treatment have been discussed with the patient and family. After consideration of risks, benefits and other options for treatment, the patient has consented to  Procedure(s) (LRB): THORACOTOMY MAJOR (Left) as a surgical intervention .  The patient's history has been reviewed, patient examined, no change in status, stable for surgery.  I have reviewed the patients' chart and labs.  Questions were answered to the patient's satisfaction.     Alleen Borne

## 2012-01-14 ENCOUNTER — Inpatient Hospital Stay (HOSPITAL_COMMUNITY): Payer: Medicare Other

## 2012-01-14 ENCOUNTER — Encounter (HOSPITAL_COMMUNITY): Payer: Self-pay | Admitting: Surgery

## 2012-01-14 LAB — BASIC METABOLIC PANEL
CO2: 24 mEq/L (ref 19–32)
GFR calc non Af Amer: 84 mL/min — ABNORMAL LOW (ref >90)
Glucose, Bld: 114 mg/dL — ABNORMAL HIGH (ref 70–99)
Potassium: 4 mEq/L (ref 3.5–5.1)
Sodium: 134 mEq/L — ABNORMAL LOW (ref 135–145)

## 2012-01-14 LAB — GLUCOSE, CAPILLARY
Glucose-Capillary: 101 mg/dL — ABNORMAL HIGH (ref 70–99)
Glucose-Capillary: 111 mg/dL — ABNORMAL HIGH (ref 70–99)

## 2012-01-14 LAB — POCT I-STAT 3, ART BLOOD GAS (G3+)
Acid-base deficit: 1 mmol/L (ref 0.0–2.0)
O2 Saturation: 91 %
Patient temperature: 97.7
TCO2: 26 mmol/L (ref 0–100)
pH, Arterial: 7.382 (ref 7.350–7.450)

## 2012-01-14 LAB — CBC
Hemoglobin: 11.1 g/dL — ABNORMAL LOW (ref 13.0–17.0)
MCHC: 32.5 g/dL (ref 30.0–36.0)
RBC: 3.72 MIL/uL — ABNORMAL LOW (ref 4.22–5.81)

## 2012-01-14 MED ORDER — CEFAZOLIN SODIUM 1-5 GM-% IV SOLN
1.0000 g | Freq: Three times a day (TID) | INTRAVENOUS | Status: DC
  Administered 2012-01-14 – 2012-01-20 (×19): 1 g via INTRAVENOUS
  Filled 2012-01-14 (×21): qty 50

## 2012-01-14 MED ORDER — CHLORHEXIDINE GLUCONATE CLOTH 2 % EX PADS
6.0000 | MEDICATED_PAD | Freq: Every day | CUTANEOUS | Status: DC
  Administered 2012-01-14 – 2012-01-17 (×4): 6 via TOPICAL

## 2012-01-14 MED ORDER — MUPIROCIN 2 % EX OINT
1.0000 "application " | TOPICAL_OINTMENT | Freq: Two times a day (BID) | CUTANEOUS | Status: DC
  Administered 2012-01-14 – 2012-01-17 (×6): 1 via NASAL
  Filled 2012-01-14: qty 22

## 2012-01-14 NOTE — Progress Notes (Signed)
Patient ID: Jose Crawford, male   DOB: 1926/01/20, 78 y.o.   MRN: [id number]                   [address]            [place] [address]          [phone]     1 Day Post-Op Procedure(s) (LRB): THORACOTOMY MAJOR (Left)  Total Length of Stay:  LOS: 1 day  BP 118/59  Pulse 72  Temp 98.5 F (36.9 C) (Oral)  Resp 19  Ht 6\' 1"  (1.854 m)  Wt 178 lb 5.6 oz (80.9 kg)  BMI 23.53 kg/m2  SpO2 98%     . bupivacaine ON-Q pain pump    . dextrose 5 % and 0.45 % NaCl with KCl 20 mEq/L 50 mL/hr at 01/14/12 1000     Lab Results  Component Value Date   WBC 10.1 01/14/2012   HGB 11.1* 01/14/2012   HCT 34.2* 01/14/2012   PLT 268 01/14/2012   GLUCOSE 114* 01/14/2012   ALT 10 01/12/2012   AST 14 01/12/2012   NA 134* 01/14/2012   K 4.0 01/14/2012   CL 103 01/14/2012   CREATININE 0.70 01/14/2012   BUN 12 01/14/2012   CO2 24 01/14/2012   TSH 1.029 Test methodology is 3rd generation TSH 10/29/2007   PSA <0.01* 12/03/2011   INR 0.89 01/12/2012   Stable post op  [name] MD  Beeper [phone] Office [phone] 01/14/2012 7:56 PM

## 2012-01-14 NOTE — Care Management Note (Signed)
    Page 1 of 1   01/20/2012     2:51:40 PM   CARE MANAGEMENT NOTE 01/20/2012  Patient:  Jose Crawford, Jose Crawford   Account Number:  [id number]  Date Initiated:  01/14/2012  Documentation initiated by:  Andrena Margerum  Subjective/Objective Assessment:   PT S/P LT THORACOTOMY WITH RESECTION OF LT ANTERIOR THYMOMA.  PTA, PT LIVES WITH SON AND DAUGHTER [place].  PT HAS HX OF DEMENTIA.     Action/Plan:   NO FAMILY PRESENT AT BEDSIDE.  WILL CONFIRM 24H SUPPORT AT DC WITH FAMILY.  WILL FOLLOW FOR HOME NEEDS.   Anticipated DC Date:  01/18/2012   Anticipated DC Plan:  SKILLED NURSING FACILITY  In-house referral  Clinical Social Worker      DC Planning Services  CM consult      Choice offered to / List presented to:             Status of service:  Completed, signed off Medicare Important Message given?   (If response is "NO", the following Medicare IM given date fields will be blank) Date Medicare IM given:   Date Additional Medicare IM given:    Discharge Disposition:  SKILLED NURSING FACILITY  Per UR Regulation:  Reviewed for med. necessity/level of care/duration of stay  If discussed at Long Length of Stay Meetings, dates discussed:    Comments:  01/20/12 Donnetta Gillin,RN,BSN PT FOR DC TODAY TO [facility], PER CSW ARRANGEMENTS.  01/17/12 Bradey Luzier,RN,BSN [id number] RECEIVED TELEPHONE CALL FROM PT'S SON IN [place].  HE STATES PT WILL NEED SNF AT DC, AS HE WILL NEED GALLBLADDER SURGERY WITHIN NEXT 6 WEEKS, AND NEEDS RESTORATIVE CARE PRIOR  TO SURGERY.  THEY PREFER [facility] AS FIRST CHOICE, AND HAVE SPOKEN WITH ADMISSIONS COORDINATOR THERE. WILL CONSULT CSW TO FACILITATE DC TO SNF WHEN MEDICALLY STABLE.

## 2012-01-14 NOTE — Clinical Documentation Improvement (Signed)
GENERIC DOCUMENTATION CLARIFICATION QUERY  THIS DOCUMENT IS NOT A PERMANENT PART OF THE MEDICAL RECORD  TO RESPOND TO THE THIS QUERY, FOLLOW THE INSTRUCTIONS BELOW:  1. If needed, update documentation for the patient's encounter via the notes activity.  2. Access this query again and click edit on the In [employer].  3. After updating, or not, click F2 to complete all highlighted (required) fields concerning your review. Select "additional documentation in the medical record" OR "no additional documentation provided".  4. Click Sign note button.  5. The deficiency will fall out of your In Basket *Please let us know if you are not able to complete this workflow by phone or e-mail (listed below).  Please update your documentation within the medical record to reflect your response to this query.                                                                                        01/14/12   Dear [facility],  In a better effort to capture your patient's severity of illness, reflect appropriate length of stay and utilization of resources, a review of the patient medical record has revealed the following indicators.    Based on your clinical judgment, please clarify and document in a progress note and/or discharge summary the clinical condition associated with the following supporting information:  In responding to this query please exercise your independent judgment.  The fact that a query is asked, does not imply that any particular answer is desired or expected.  Possible Clinical Conditions?  _______Atelectasis   _______Pleural Effusion  _______Other Condition  _______Cannot Clinically Determine    Supporting Information:  Diagnostics: CXR results 6/20:  Dependent atelectasis and probable effusion at the left base.  Treatment: 6/20:  Turn, cough, deep breath, and incentive spirometry when extubated.   You may use possible, probable, or suspect with inpatient  documentation. possible, probable, suspected diagnoses MUST be documented at the time of discharge  Reviewed: Additional documentation provided in the progress notes.  Thank You,  Marciano Sequin,  Clinical Documentation Specialist:  Pager: [phone]  Health Information Management [place]

## 2012-01-14 NOTE — Progress Notes (Signed)
1 Day Post-Op Procedure(s) (LRB): THORACOTOMY MAJOR (Left) Subjective: Complains of soreness from surgery  Objective: Vital signs in last 24 hours: Temp:  [95 F (35 C)-98.5 F (36.9 C)] 97.7 F (36.5 C) (06/21 0818) Pulse Rate:  [60-105] 105  (06/21 0900) Cardiac Rhythm:  [-] Normal sinus rhythm (06/21 0900) Resp:  [9-22] 15  (06/21 0900) BP: (96-143)/(46-84) 124/64 mmHg (06/21 0900) SpO2:  [92 %-100 %] 99 % (06/21 0900) Arterial Line BP: (108-165)/(40-73) 142/67 mmHg (06/21 0900) Weight:  [80.9 kg (178 lb 5.6 oz)] 80.9 kg (178 lb 5.6 oz) (06/21 0800)  Hemodynamic parameters for last 24 hours:    Intake/Output from previous day: 06/20 0701 - 06/21 0700 In: 2833 [P.O.:120; I.V.:2313; IV Piggyback:400] Out: 2130 [Urine:1170; Drains:350; Blood:50; Chest Tube:560] Intake/Output this shift: Total I/O In: 390 [P.O.:240; I.V.:150] Out: 225 [Urine:150; Chest Tube:75]  General appearance: alert and cooperative Lungs:  Clear Heart: RRR Small, intermittent air leak from chest tube Cholecystostomy tube site is erythematous and swollen Lab Results:  Basename 01/14/12 0400 01/12/12 1333  WBC 10.1 8.7  HGB 11.1* 13.3  HCT 34.2* 40.3  PLT 268 340   BMET:  Basename 01/14/12 0400 01/12/12 1333  NA 134* 135  K 4.0 3.6  CL 103 102  CO2 24 25  GLUCOSE 114* 81  BUN 12 18  CREATININE 0.70 0.83  CALCIUM 10.0 11.1*    PT/INR:  Basename 01/12/12 1333  LABPROT 12.2  INR 0.89   ABG    Component Value Date/Time   PHART 7.382 01/14/2012 0411   HCO3 24.7* 01/14/2012 0411   TCO2 26 01/14/2012 0411   ACIDBASEDEF 1.0 01/14/2012 0411   O2SAT 91.0 01/14/2012 0411   CBG (last 3)   Basename 01/14/12 0817 01/14/12 0407 01/13/12 2342  GLUCAP 101* 119* 115*    Assessment/Plan: S/P Procedure(s) (LRB): THORACOTOMY MAJOR (Left) DC A-line OOB CT's to 10cm suction.  There is still a small air leak. Will keep foley in today to monitor urine output Start dressing changes around the  cholecystostomy tube.  Will put on Ancef since the area around the tube is erythematous and swollen. General surgery wanted to get a cholangiogram through the tube prior to discharge but will wait until next week.   LOS: 1 day    Abimelec Grochowski K 01/14/2012

## 2012-01-14 NOTE — Telephone Encounter (Signed)
Called and left another message for Annice Pih to call back.

## 2012-01-14 NOTE — Telephone Encounter (Signed)
Spoke with Jose Crawford, aware to continue current orders. She will call with any questions.

## 2012-01-15 ENCOUNTER — Inpatient Hospital Stay (HOSPITAL_COMMUNITY): Payer: Medicare Other

## 2012-01-15 LAB — COMPREHENSIVE METABOLIC PANEL
ALT: 8 U/L (ref 0–53)
AST: 23 U/L (ref 0–37)
Alkaline Phosphatase: 60 U/L (ref 39–117)
CO2: 26 mEq/L (ref 19–32)
Chloride: 99 mEq/L (ref 96–112)
GFR calc Af Amer: >90 mL/min (ref >90)
GFR calc non Af Amer: 81 mL/min — ABNORMAL LOW (ref >90)
Glucose, Bld: 117 mg/dL — ABNORMAL HIGH (ref 70–99)
Potassium: 4 mEq/L (ref 3.5–5.1)
Sodium: 131 mEq/L — ABNORMAL LOW (ref 135–145)

## 2012-01-15 LAB — CBC
Hemoglobin: 11 g/dL — ABNORMAL LOW (ref 13.0–17.0)
RBC: 3.61 MIL/uL — ABNORMAL LOW (ref 4.22–5.81)
WBC: 10.9 10*3/uL — ABNORMAL HIGH (ref 4.0–10.5)

## 2012-01-15 NOTE — Progress Notes (Signed)
2 Days Post-Op Procedure(s) (LRB): THORACOTOMY MAJOR (Left) Subjective: Complains of soreness from surgery  Objective: Vital signs in last 24 hours: Temp:  [97.4 F (36.3 C)-98.6 F (37 C)] 97.9 F (36.6 C) (06/22 0748) Pulse Rate:  [65-89] 77  (06/22 0700) Cardiac Rhythm:  [-] Normal sinus rhythm (06/22 0800) Resp:  [10-21] 15  (06/22 0700) BP: (104-130)/(51-71) 124/62 mmHg (06/22 0700) SpO2:  [90 %-100 %] 95 % (06/22 0907) Arterial Line BP: (146)/(60) 146/60 mmHg (06/21 1000)  Hemodynamic parameters for last 24 hours:    Intake/Output from previous day: 06/21 0701 - 06/22 0700 In: 2185 [P.O.:660; I.V.:1275; IV Piggyback:250] Out: 2785 [Urine:2305; Drains:90; Chest Tube:390] Intake/Output this shift:    General appearance: alert and cooperative, but confused Lungs:  Clear Heart: RRR Small, intermittent air leak from chest tube Cholecystostomy tube site is erythematous and swollen, no air leak from tubes Lab Results:  Basename 01/15/12 0400 01/14/12 0400  WBC 10.9* 10.1  HGB 11.0* 11.1*  HCT 33.5* 34.2*  PLT 230 268   BMET:   Basename 01/15/12 0400 01/14/12 0400  NA 131* 134*  K 4.0 4.0  CL 99 103  CO2 26 24  GLUCOSE 117* 114*  BUN 10 12  CREATININE 0.78 0.70  CALCIUM 10.1 10.0    PT/INR:   Basename 01/12/12 1333  LABPROT 12.2  INR 0.89   ABG    Component Value Date/Time   PHART 7.382 01/14/2012 0411   HCO3 24.7* 01/14/2012 0411   TCO2 26 01/14/2012 0411   ACIDBASEDEF 1.0 01/14/2012 0411   O2SAT 91.0 01/14/2012 0411   CBG (last 3)   Basename 01/15/12 0746 01/15/12 0351 01/15/12 0019  GLUCAP 102* 108* 129*    Assessment/Plan: S/P Procedure(s) (LRB): THORACOTOMY MAJOR (Left) DC A-line OOB CT's to 10cm suction.  There is no l air leak. D/c posterior chest tube Will keep d/c foley  continue dressing changes around the cholecystostomy tube.  Will put on Ancef since the area around the tube is erythematous and swollen. General surgery wanted to  get a cholangiogram through the tube prior to discharge but will wait until next week.   LOS: 2 days    Dola Lunsford B 01/15/2012

## 2012-01-15 NOTE — Progress Notes (Signed)
Patient ID: Jose Crawford, male   DOB: 26-May-1926, 78 y.o.   MRN: [id number]

## 2012-01-15 NOTE — Progress Notes (Signed)
Patient ID: EDDER BELLANCA, male   DOB: March 24, 1926, 78 y.o.   MRN: [id number]                   [address]            [place] [address]          [phone]     2 Days Post-Op Procedure(s) (LRB): THORACOTOMY MAJOR (Left)  Total Length of Stay:  LOS: 2 days  BP 90/49  Pulse 86  Temp 97.6 F (36.4 C) (Oral)  Resp 15  Ht 6\' 1"  (1.854 m)  Wt 178 lb 5.6 oz (80.9 kg)  BMI 23.53 kg/m2  SpO2 98%     . bupivacaine ON-Q pain pump    . dextrose 5 % and 0.45 % NaCl with KCl 20 mEq/L 50 mL/hr at 01/15/12 0229     Lab Results  Component Value Date   WBC 10.9* 01/15/2012   HGB 11.0* 01/15/2012   HCT 33.5* 01/15/2012   PLT 230 01/15/2012   GLUCOSE 117* 01/15/2012   ALT 8 01/15/2012   AST 23 01/15/2012   NA 131* 01/15/2012   K 4.0 01/15/2012   CL 99 01/15/2012   CREATININE 0.78 01/15/2012   BUN 10 01/15/2012   CO2 26 01/15/2012   TSH 1.029 Test methodology is 3rd generation TSH 10/29/2007   PSA <0.01* 12/03/2011   INR 0.89 01/12/2012  Stable day   [name] MD  Beeper [phone] Office [phone] 01/15/2012 6:19 PM

## 2012-01-16 ENCOUNTER — Inpatient Hospital Stay (HOSPITAL_COMMUNITY): Payer: Medicare Other

## 2012-01-16 LAB — CBC
HCT: 33.6 % — ABNORMAL LOW (ref 39.0–52.0)
Hemoglobin: 11.1 g/dL — ABNORMAL LOW (ref 13.0–17.0)
MCH: 30.7 pg (ref 26.0–34.0)
MCHC: 33 g/dL (ref 30.0–36.0)
MCV: 92.8 fL (ref 78.0–100.0)
Platelets: 249 10*3/uL (ref 150–400)
RBC: 3.62 MIL/uL — ABNORMAL LOW (ref 4.22–5.81)
RDW: 14.3 % (ref 11.5–15.5)
WBC: 10.1 10*3/uL (ref 4.0–10.5)

## 2012-01-16 LAB — BASIC METABOLIC PANEL
BUN: 11 mg/dL (ref 6–23)
CO2: 26 mEq/L (ref 19–32)
Calcium: 10.6 mg/dL — ABNORMAL HIGH (ref 8.4–10.5)
Chloride: 102 mEq/L (ref 96–112)
Creatinine, Ser: 1.36 mg/dL — ABNORMAL HIGH (ref 0.50–1.35)
GFR calc Af Amer: 53 mL/min — ABNORMAL LOW (ref >90)
GFR calc non Af Amer: 46 mL/min — ABNORMAL LOW (ref >90)
Glucose, Bld: 110 mg/dL — ABNORMAL HIGH (ref 70–99)
Potassium: 4.3 mEq/L (ref 3.5–5.1)
Sodium: 135 mEq/L (ref 135–145)

## 2012-01-16 NOTE — Progress Notes (Signed)
Patient ID: Jose Crawford, male   DOB: 1926/01/26, 78 y.o.   MRN: [id number]                      [address]            [place] [address]          [phone]     3 Days Post-Op Procedure(s) (LRB): THORACOTOMY MAJOR (Left)  Total Length of Stay:  LOS: 3 days  BP 134/66  Pulse 87  Temp 97.5 F (36.4 C) (Oral)  Resp 11  Ht 6\' 1"  (1.854 m)  Wt 167 lb 5.3 oz (75.9 kg)  BMI 22.08 kg/m2  SpO2 94%     . bupivacaine ON-Q pain pump    . dextrose 5 % and 0.45 % NaCl with KCl 20 mEq/L 50 mL/hr at 01/16/12 0600     Lab Results  Component Value Date   WBC 10.1 01/16/2012   HGB 11.1* 01/16/2012   HCT 33.6* 01/16/2012   PLT 249 01/16/2012   GLUCOSE 110* 01/16/2012   ALT 8 01/15/2012   AST 23 01/15/2012   NA 135 01/16/2012   K 4.3 01/16/2012   CL 102 01/16/2012   CREATININE 1.36* 01/16/2012   BUN 11 01/16/2012   CO2 26 01/16/2012   TSH 1.029 Test methodology is 3rd generation TSH 10/29/2007   PSA <0.01* 12/03/2011   INR 0.89 01/12/2012   Acute urinary retention, foley now back in  [name] MD  Beeper [phone] Office [phone] 01/16/2012 7:44 PM

## 2012-01-16 NOTE — Progress Notes (Signed)
At this time patient has had little urine output <30cc. 6 hour mark has arrived status foley removal, bladder scan performed at this time. 350 result. Patient straight cath at this time, 340 output. Will continue to monitor the patient.

## 2012-01-16 NOTE — Progress Notes (Signed)
Patient ID: Jose Crawford, male   DOB: [birth year], 78 y.o.   MRN: [id number] TCTS DAILY PROGRESS NOTE                   [address]            [place] [address]          [phone]      3 Days Post-Op Procedure(s) (LRB): THORACOTOMY MAJOR (Left)  Total Length of Stay:  LOS: 3 days   Subjective: Still confused  Objective: Vital signs in last 24 hours: Temp:  [97.3 F (36.3 C)-99.2 F (37.3 C)] 99.2 F (37.3 C) (06/23 0816) Pulse Rate:  [50-106] 84  (06/23 0900) Cardiac Rhythm:  [-] Normal sinus rhythm (06/23 0700) Resp:  [9-21] 14  (06/23 0900) BP: (90-159)/(49-115) 134/72 mmHg (06/23 0900) SpO2:  [77 %-99 %] 97 % (06/23 0900) Weight:  [167 lb 5.3 oz (75.9 kg)] 167 lb 5.3 oz (75.9 kg) (06/23 0500)  Filed Weights   01/14/12 0500 01/14/12 0800 01/16/12 0500  Weight: 178 lb 5.6 oz (80.9 kg) 178 lb 5.6 oz (80.9 kg) 167 lb 5.3 oz (75.9 kg)    Weight change: -11 lb 0.4 oz (-5 kg)   Hemodynamic parameters for last 24 hours:    Intake/Output from previous day: 06/22 0701 - 06/23 0700 In: 1660 [P.O.:360; I.V.:1150; IV Piggyback:150] Out: 1430 [Urine:1150; Drains:100; Chest Tube:180]  Intake/Output this shift: Total I/O In: 240 [P.O.:240] Out: -   Current Meds: Scheduled Meds:   . bisacodyl  10 mg Oral Daily  .  ceFAZolin (ANCEF) IV  1 g Intravenous Q8H  . Chlorhexidine Gluconate Cloth  6 each Topical Daily  . levalbuterol  0.63 mg Nebulization Q6H  . mupirocin ointment  1 application Nasal BID  . DISCONTD: insulin aspart  0-24 Units Subcutaneous Q4H   Continuous Infusions:   . bupivacaine ON-Q pain pump    . dextrose 5 % and 0.45 % NaCl with KCl 20 mEq/L 50 mL/hr at 01/16/12 0600   PRN Meds:.morphine injection, ondansetron (ZOFRAN) IV, oxyCODONE-acetaminophen, potassium chloride, senna-docusate, traMADol  General appearance: alert, cooperative, no distress and slowed mentation Neurologic: intact Heart: regular rate and rhythm, S1, S2  normal, no murmur, click, rub or gallop and normal apical impulse Lungs: clear to auscultation bilaterally and normal percussion bilaterally Abdomen: soft, non-tender; bowel sounds normal; no masses,  no organomegaly Extremities: extremities normal, atraumatic, no cyanosis or edema and Homans sign is negative, no sign of DVT no air leak  Lab Results: CBC: Basename 01/16/12 0432 01/15/12 0400  WBC 10.1 10.9*  HGB 11.1* 11.0*  HCT 33.6* 33.5*  PLT 249 230   BMET:  Basename 01/16/12 0432 01/15/12 0400  NA 135 131*  K 4.3 4.0  CL 102 99  CO2 26 26  GLUCOSE 110* 117*  BUN 11 10  CREATININE 1.36* 0.78  CALCIUM 10.6* 10.1    PT/INR: No results found for this basename: LABPROT,INR in the last 72 hours Radiology: Dg Chest Port 1 View  01/16/2012  *RADIOLOGY REPORT*  Clinical Data: Postop  PORTABLE CHEST - 1 VIEW  Comparison: Yesterday  Findings: Left chest tube and left internal jugular vein central venous catheter are stable.  Tiny left apical pneumothorax. Bibasilar atelectasis stable.  No right pneumothorax.  Upper normal heart size.  IMPRESSION: Tiny left apical pneumothorax.  Stable bibasilar atelectasis.  Original Report Authenticated By: Donavan Burnet, M.D.   Dg Chest Port 1 View  01/15/2012  *  RADIOLOGY REPORT*  Clinical Data: Postop resection of mediastinal mass.  Left chest tubes in place.  PORTABLE CHEST - 1 VIEW 01/15/2012 0734 hours:  Comparison: Portable chest x-rays yesterday and 01/13/2012.  Findings: Two left chest tubes in place with no pneumothorax. Suboptimal inspiration.  Atelectasis in the lung bases, left greater than right, with improved aeration since yesterday.  No new pulmonary parenchymal abnormalities.  Cardiac silhouette normal in size.  Pulmonary vascularity normal.  Left jugular central venous catheter tip in the upper SVC.  IMPRESSION: Support apparatus satisfactory.  No pneumothorax.  Improved aeration in the lung bases with mild to moderate atelectasis  persisting, left greater than right.  No new abnormalities.  Original Report Authenticated By: Arnell Sieving, M.D.     Assessment/Plan: S/P Procedure(s) (LRB): THORACOTOMY MAJOR (Left) Mobilize acute urinary retention, with increase in CR, i/o cath during night, replace foley D/c chest tube     [name] MD  Beeper [phone] Office [phone] 01/16/2012 9:33 AM

## 2012-01-17 ENCOUNTER — Inpatient Hospital Stay (HOSPITAL_COMMUNITY): Payer: Medicare Other

## 2012-01-17 LAB — CBC
HCT: 32.6 % — ABNORMAL LOW (ref 39.0–52.0)
Hemoglobin: 10.5 g/dL — ABNORMAL LOW (ref 13.0–17.0)
MCH: 29.9 pg (ref 26.0–34.0)
MCHC: 32.2 g/dL (ref 30.0–36.0)
MCV: 92.9 fL (ref 78.0–100.0)
Platelets: 253 10*3/uL (ref 150–400)
RBC: 3.51 MIL/uL — ABNORMAL LOW (ref 4.22–5.81)
RDW: 14.4 % (ref 11.5–15.5)
WBC: 8.2 10*3/uL (ref 4.0–10.5)

## 2012-01-17 LAB — BASIC METABOLIC PANEL
BUN: 11 mg/dL (ref 6–23)
CO2: 27 mEq/L (ref 19–32)
Calcium: 10.6 mg/dL — ABNORMAL HIGH (ref 8.4–10.5)
Chloride: 101 mEq/L (ref 96–112)
Creatinine, Ser: 0.83 mg/dL (ref 0.50–1.35)
GFR calc Af Amer: >90 mL/min (ref >90)
GFR calc non Af Amer: 79 mL/min — ABNORMAL LOW (ref >90)
Glucose, Bld: 114 mg/dL — ABNORMAL HIGH (ref 70–99)
Potassium: 4 mEq/L (ref 3.5–5.1)
Sodium: 133 mEq/L — ABNORMAL LOW (ref 135–145)

## 2012-01-17 NOTE — Progress Notes (Signed)
Clinical Social Work Department CLINICAL SOCIAL WORK PLACEMENT NOTE 01/17/2012  Patient:  RASTUS, BORTON  Account Number:  [id number] Admit date:  01/13/2012  Clinical Social Worker:  Bonnye Fava, LCSW  Date/time:  01/17/2012 02:00 PM  Clinical Social Work is seeking post-discharge placement for this patient at the following level of care:   SKILLED NURSING   (*CSW will update this form in Epic as items are completed)   01/17/2012  Patient/family provided with [facility] Department of Clinical Social Work's list of facilities offering this level of care within the geographic area requested by the patient (or if unable, by the patient's family).  01/17/2012  Patient/family informed of their freedom to choose among providers that offer the needed level of care, that participate in Medicare, Medicaid or managed care program needed by the patient, have an available bed and are willing to accept the patient.  01/17/2012  Patient/family informed of [facility]' ownership interest in [facility], as well as of the fact that they are under no obligation to receive care at this facility.  PASARR submitted to EDS on 01/17/2012 PASARR number received from EDS on   FL2 transmitted to all facilities in geographic area requested by pt/family on  01/17/2012 FL2 transmitted to all facilities within larger geographic area on   Patient informed that his/her managed care company has contracts with or will negotiate with  certain facilities, including the following:     Patient/family informed of bed offers received:   Patient chooses bed at  Physician recommends and patient chooses bed at    Patient to be transferred to  on   Patient to be transferred to facility by   The following physician request were entered in Epic:   Additional Comments:  Jodean Lima, [phone]

## 2012-01-17 NOTE — Progress Notes (Signed)
Clinical Social Work Department BRIEF PSYCHOSOCIAL ASSESSMENT 01/17/2012  Patient:  Jose Crawford, Jose Crawford     Account Number:  [id number]     Admit date:  01/13/2012  Clinical Social Worker:  Conley Simmonds  Date/Time:  01/17/2012 01:30 PM  Referred by:  Care Management  Date Referred:  01/17/2012 Referred for  SNF Placement   Other Referral:   Interview type:  Family Other interview type:    PSYCHOSOCIAL DATA Living Status:  FAMILY Admitted from facility:   Level of care:   Primary support name:  Jose Crawford [phone] Primary support relationship to patient:  CHILD, ADULT Degree of support available:   Strong    CURRENT CONCERNS Current Concerns  Post-Acute Placement   Other Concerns:    SOCIAL WORK ASSESSMENT / PLAN CSW spoke with pt son per pt request-Pt has been lives with son and dtr in law who assists with decision making.  CSW relayed purpose and process of SNF placement.  Pt and family agreeable to SNF - CSW will initiate bed search and facilitate d/c planning   Assessment/plan status:  Psychosocial Support/Ongoing Assessment of Needs Other assessment/ plan:   Information/referral to community resources:   ST SNF list    PATIENT'S/FAMILY'S RESPONSE TO PLAN OF CARE: Pt and family agreeable to SNF and would like [facility] if possible-pts son and dtr in law agreeable to a search in [employer] and will identify alternate options in the event that Jose Crawford is not able to offer a spot.--  Jose Crawford, [phone]

## 2012-01-17 NOTE — Progress Notes (Signed)
4 Days Post-Op Procedure(s) (LRB): THORACOTOMY MAJOR (Left) Subjective: Feels terrible.  Objective: Vital signs in last 24 hours: Temp:  [97.5 F (36.4 C)-99.2 F (37.3 C)] 98 F (36.7 C) (06/24 0756) Pulse Rate:  [64-95] 84  (06/24 0700) Cardiac Rhythm:  [-] Normal sinus rhythm (06/24 0700) Resp:  [10-23] 20  (06/24 0700) BP: (99-144)/(46-72) 120/63 mmHg (06/24 0700) SpO2:  [93 %-100 %] 96 % (06/24 0742) Weight:  [74 kg (163 lb 2.3 oz)] 74 kg (163 lb 2.3 oz) (06/24 0600)  Hemodynamic parameters for last 24 hours:    Intake/Output from previous day: 06/23 0701 - 06/24 0700 In: 1950 [P.O.:600; I.V.:1100; IV Piggyback:150] Out: 3040 [Urine:2970; Drains:20; Chest Tube:50] Intake/Output this shift:    General appearance: alert and cooperative Neurologic: intact Heart: regular rate and rhythm, S1, S2 normal, no murmur, click, rub or gallop Lungs: clear to auscultation bilaterally Wound: incision ok  Lab Results:  Basename 01/17/12 0330 01/16/12 0432  WBC 8.2 10.1  HGB 10.5* 11.1*  HCT 32.6* 33.6*  PLT 253 249   BMET:  Basename 01/17/12 0330 01/16/12 0432  NA 133* 135  K 4.0 4.3  CL 101 102  CO2 27 26  GLUCOSE 114* 110*  BUN 11 11  CREATININE 0.83 1.36*  CALCIUM 10.6* 10.6*    PT/INR: No results found for this basename: LABPROT,INR in the last 72 hours ABG    Component Value Date/Time   PHART 7.382 01/14/2012 0411   HCO3 24.7* 01/14/2012 0411   TCO2 26 01/14/2012 0411   ACIDBASEDEF 1.0 01/14/2012 0411   O2SAT 91.0 01/14/2012 0411   CBG (last 3)   Basename 01/15/12 0746 01/15/12 0351 01/15/12 0019  GLUCAP 102* 108* 129*    Assessment/Plan: S/P Procedure(s) (LRB): THORACOTOMY MAJOR (Left) Mobilize Continue foley due to bladder outlet obstruction Transfer to ptcu   LOS: 4 days    Meribeth Vitug K 01/17/2012

## 2012-01-18 ENCOUNTER — Inpatient Hospital Stay (HOSPITAL_COMMUNITY): Payer: Medicare Other

## 2012-01-18 MED ORDER — TRAMADOL HCL 50 MG PO TABS: 50.0000 mg | ORAL_TABLET | Freq: Four times a day (QID) | ORAL | Status: AC | PRN

## 2012-01-18 MED ORDER — IOHEXOL 300 MG/ML  SOLN
50.0000 mL | Freq: Once | INTRAMUSCULAR | Status: AC | PRN
  Administered 2012-01-18: 10 mL

## 2012-01-18 NOTE — Discharge Summary (Signed)
Physician Discharge Summary  Patient ID: Jose Crawford MRN: [id number] DOB/AGE: 01-09-1926 78 y.o.  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 Laneta Simmers on 01/13/2012.  History of Presenting Illness: This is an 78 year old gentleman who was admitted with nausea, vomiting, abdominal pain and diagnosed with acute cholecystitis. His initial chest x-ray was not read as showing a lung mass but when he had an abdominal CT scan performed the uppermost cuts clearly showed a large mass adjacent to the left ventricle. His acute cholecystitis was treated with a percutaneous cholecystostomy tube with quick resolution of his leukocytosis. A complete chest CT scan showed a 6.2 x 9.3 x 8.7 cm mass adjacent to the left heart border. 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 He was discharged from the hospital and had a followup appointment with [name]. He felt the best treatment would be surgical resection if possible and the patient was referred back to Dr. Laneta Simmers for consideration of that.  He has been living with his daughter for the past 4 years. She said that he has had some decline in his functioning over the past 4 years but is still fairly active and has had very few problems, until his acute cholecystitis. He's been doing well since his recent discharge. Potential risks, complications, and benefits of the surgery were discussed with the patient and his daughter. He agreed to proceed. He was admitted to [facility] on 01/12/2002 in order to undergo a left thoracotomy for resection of a thymoma.  Brief Hospital Course:  Patient remained afebrile and hemodynamically stable.Aline was removed on post op day one.His chest tube had a small air leak and was decreased to 10 cm of suction. One of his chest tubes was removed on 6/22.His foley remained for a couple of days and then was removed. He had an increase in creatinine as well as required in and out cath. His foley was re inserted. Remaining chest tube was removed on 6/23.He was felt surgically stable for transfer from the ICU to [facility] for further convalescence on 6/24. He did have confusion post op. Narcotics were discontinued. Foley was then removed and he was able to void on his own.He has been tolerating a diet and has had a bowel movement. Provided he remains afebrile, hemodynamically stable, and pending morning round evaluation, he will be surgically stable for discharge on 01/19/2012.   Latest Vital Signs: Blood pressure 136/65, pulse 82, temperature 98.3 F (36.8 C), temperature source Oral, resp. rate 18, height 6\' 1"  (1.854 m), weight 172 lb 6.4 oz (78.2 kg), SpO2 93.00%.  Physical Exam: Cardiovascular: RRR, no murmurs, gallops, or rubs.  Pulmonary: Clear to auscultation bilaterally; no rales, wheezes, or rhonchi.  Abdomen: Soft, non tender, bowel sounds present.  Extremities: SCDs in place.  Wound: Clean and dry. No erythema or signs of infection.  Discharge Condition:Stable  Recent laboratory studies:  Lab Results  Component Value Date   WBC 8.2 01/17/2012   HGB 10.5* 01/17/2012   HCT 32.6* 01/17/2012   MCV 92.9 01/17/2012   PLT 253 01/17/2012   Lab Results  Component Value  Date   NA 133* 01/17/2012   K 4.0 01/17/2012   CL 101 01/17/2012   CO2 27 01/17/2012   CREATININE 0.83 01/17/2012   GLUCOSE 114* 01/17/2012      Diagnostic Studies: Dg Chest 2 View  01/17/2012  *RADIOLOGY REPORT*  Clinical Data: Postoperative evaluation.  Chest tube.   PORTABLE CHEST - 1 VIEW  Comparison: Chest x-ray 01/16/2012.  Findings: New compared to yesterday's examination there has been interval removal of the previously noted left-sided chest tube.  At this time, there is a small left-sided pneumothorax (approximately 10% of the volume of the left hemithorax) noted surrounding the left upper lung, with a component in the inferior aspect of the left costophrenic sulcus as well.  There is a left-sided internal jugular central venous catheter with tip terminating in the proximal superior vena cava. There is architectural distortion in the left hemithorax and an appearance of left lateral pleural thickening and/or loculated pleural fluid.  The right lung is low volume, but otherwise unremarkable.  Heart size is within normal limits.  Mediastinal contours are distorted by patient's mild rotation to the right.  Atherosclerotic calcifications are noted within the arch of the aorta.  Multiple surgical clips are seen overlying the left upper heart border, at site of recent mass resection.  IMPRESSION: 1.  Status post removal of left-sided chest tube with small left- sided pneumothorax (approximately 10% of the volume of the left hemithorax). 2.  Left lateral pleural thickening versus loculated pleural fluid. 3.  Additional postoperative changes, and support apparatus, as above.  Original Report Authenticated By: Florencia Reasons, M.D.    Discharge Orders    Future Appointments: Provider: Department: Dept Phone: Center:   02/07/2012 3:00 PM [facility] [phone] [facility]   02/24/2012 3:30 PM Atilano Ina, MD,FACS [facility] [phone] None      Discharge Medications: Medication List  As of 01/18/2012  9:12 AM   TAKE these medications         acetaminophen 500 MG tablet   Commonly known as: TYLENOL   Take 1,000 mg by mouth every 6 (six) hours as needed. For pain      nystatin cream   Commonly known as: MYCOSTATIN   Apply topically 2 (two)  times daily. To groin      traMADol 50 MG tablet   Commonly known as: ULTRAM   Take 1-2 tablets (50-100 mg total) by mouth every 6 (six) hours as needed.            Follow Up Appointments: Follow-up Information    Follow up with Alleen Borne, MD. (PA/LAT CXR to be taken on 02/07/12 (at [facility] which is in the same building as Dr. Sharee Pimple office)  at 2:00  ;Appointment with Dr. Sharee Pimple physician assistant is on 02/06/2102 at  3:00 pm)    Contact information:   [address] [place] [address] [phone]          Signed: Doree Fudge MPA-C 01/18/2012, 9:12 AM

## 2012-01-18 NOTE — Progress Notes (Signed)
Pt ambulated 150 ft with rolling walker with 2 person assist. Pt tolerated ambulation well. Pt back to bed with call bell in reach. Jose Crawford

## 2012-01-18 NOTE — Discharge Instructions (Signed)
ACTIVITY:  1.Increase activity slowly. 2.Walk daily and increase frequency and duration as tolerates. 3.May walk up steps. 4.No lifting more than ten pounds for two weeks. 5.No driving for two weeks. 6.Avoid straining. 7.STOP any activity that causes chest pain, shortness of breath, dizziness,sweating, or excessive weakness. 8.Continue with breathing exercises daily.  DIET: regular  WOUND:  1.May shower. 2.Clean wounds with mild soap and water.  Call the office at [phone] if any problems arise.  Thoracotomy Care After Refer to this sheet in the next few weeks. These instructions provide you with information on caring for yourself after your procedure. Your caregiver may also give you more specific instructions. Your treatment has been planned according to current medical practices, but problems sometimes occur. Call your caregiver if you have any problems or questions after your procedure. HOME CARE INSTRUCTIONS  Remove the bandage (dressing) over your chest tube site as directed by your caregiver.   It is normal to be sore for a couple weeks following surgery. See your caregiver if this seems to be getting worse rather than better.   Only take over-the-counter or prescription medicines for pain, discomfort, or fever as directed by your caregiver. It is very important to take pain medicine when you need it so that you will cough and breathe deeply enough to clear mucus (phlegm) and expand your lungs. This helps prevent a lung infection (pneumonia).   If it hurts to cough, hold a pillow against your chest when you cough. This may help with the discomfort. In spite of the discomfort, cough frequently.   Taking deep breaths keeps lungs inflated and protects against pneumonia. Most patients will go home with a device called an incentive spirometer that encourages deep breathing.   You may resume a normal diet and activities as directed.   Use showers for bathing until you see your  caregiver, or as instructed.   Change dressings if necessary or as directed.   Avoid lifting or driving until you are instructed otherwise.   Make an appointment to see your caregiver for stitch (suture) or staple removal when instructed.   Do not travel by airplane for 2 weeks after the chest tube is removed.  SEEK MEDICAL CARE IF:  You are bleeding from your wounds.   Your heartbeat seems irregular.   You have redness, swelling, or increasing pain in the wounds.   There is pus coming from your wounds.   There is a bad smell coming from the wound or dressing.  SEEK IMMEDIATE MEDICAL CARE IF:  You have a fever.   You develop a rash.   You have difficulty breathing.   You develop any reaction or side effects to medicines given.   You develop lightheadedness or feel faint.   You develop shortness of breath or chest pain.  MAKE SURE YOU:  Understand these instructions.   Will watch your condition.   Will get help right away if you are not doing well or get worse.  Document Released: 12/25/2010 Document Revised: 07/01/2011 Document Reviewed: 12/25/2010 [facility] Patient Information 2012 [place], [place].

## 2012-01-18 NOTE — Progress Notes (Addendum)
5 Days Post-Op Procedure(s) (LRB): THORACOTOMY MAJOR (Left)  Subjective: Patient pleasantly confused.  Objective: Vital signs in last 24 hours: Patient Vitals for the past 24 hrs:  BP Temp Temp src Pulse Resp SpO2 Weight  01/18/12 0349 136/65 mmHg 98.3 F (36.8 C) Oral 82  18  93 % -  01/18/12 0314 140/77 mmHg - - 82  18  93 % -  01/18/12 0008 130/71 mmHg - - 80  18  93 % 172 lb 6.4 oz (78.2 kg)  01/17/12 2017 126/74 mmHg 98.7 F (37.1 C) Oral 80  16  92 % -  01/17/12 1418 117/67 mmHg 97.3 F (36.3 C) Oral 87  16  93 % -  01/17/12 1313 - - - - - 93 % -  01/17/12 1150 134/87 mmHg 98.4 F (36.9 C) Oral 82  - - -  01/17/12 1100 124/72 mmHg - - 120  11  96 % -  01/17/12 0800 114/61 mmHg - - 80  15  96 % -  01/17/12 0756 - 98 F (36.7 C) Oral - - - -  01/17/12 0742 - - - - - 96 % -    Current Weight  01/18/12 172 lb 6.4 oz (78.2 kg)      Intake/Output from previous day: 06/24 0701 - 06/25 0700 In: 1200 [P.O.:900; I.V.:250; IV Piggyback:50] Out: 2575 [Urine:2325; Drains:250]   Physical Exam:  Cardiovascular: RRR, no murmurs, gallops, or rubs. Pulmonary: Clear to auscultation bilaterally; no rales, wheezes, or rhonchi. Abdomen: Soft, non tender, bowel sounds present. Extremities: SCDs in place. Wound: Clean and dry.  No erythema or signs of infection.  Lab Results: CBC: Basename 01/17/12 0330 01/16/12 0432  WBC 8.2 10.1  HGB 10.5* 11.1*  HCT 32.6* 33.6*  PLT 253 249   BMET:  Basename 01/17/12 0330 01/16/12 0432  NA 133* 135  K 4.0 4.3  CL 101 102  CO2 27 26  GLUCOSE 114* 110*  BUN 11 11  CREATININE 0.83 1.36*  CALCIUM 10.6* 10.6*      Assessment/Plan:  1. CV - SR.Final pathology showed thymoma. 2.  Pulmonary - Encourage incentive spirometer. 3.  Acute blood loss anemia -Last H and H 10.5 and 32.6. 4.Bladder outlet obstruction-foley is still in.Will remove soon for voiding trial. 5.Confusion-Only given Ultram.Has a history of dementia. 6.May need SNF  upon discharge;await family's decision.    ZIMMERMAN,DONIELLE MPA-C 01/18/2012    Chart reviewed, patient examined, agree with above. Will check with general surgery to see what biliary study they want to perform while he is here.

## 2012-01-18 NOTE — Procedures (Signed)
Delayed filling of Custic duct. Chole drain exchanged No comp

## 2012-01-18 NOTE — Progress Notes (Signed)
Contacted patient's son via phone to advise on SNF bed offfers rec'd thus far- he plans to tour and research the options and get back with me. He has been advised that [facility] is not able to offer a bed to patient. Noted sitter to be d/c'ed @11am . Reece Levy, MSW, [facility] Majors [phone]

## 2012-01-19 MED ORDER — LACTULOSE 10 GM/15ML PO SOLN
20.0000 g | Freq: Once | ORAL | Status: AC
  Administered 2012-01-19: 20 g via ORAL
  Filled 2012-01-19: qty 30

## 2012-01-19 NOTE — Progress Notes (Signed)
Foley removed at 0845 am per MD order. Will continue to monitor output.Dion Saucier

## 2012-01-19 NOTE — Progress Notes (Signed)
SNF bed had been confirmed earlier this morning at [facility] and family is scheduled to complete admission paperwork @ 1pm today. Rec'd follow up call that the room would now not be available because of a discharge from SNF that was cancelled. I have discussed with son and he does not want to consider any other SNF's so would like to consider taking his dad home until SNF bed opens up at [facility] (Friday at the latest). Awaiting callback from PA-C to confirm this plan-  Reece Levy, MSW, The[facility]ia Majors [phone]

## 2012-01-19 NOTE — Progress Notes (Signed)
Pt had large bowel movement and also voided after removal of catheter. Was unable to measure urine. PA aware. Will continue to monitor output. Dion Saucier

## 2012-01-19 NOTE — Progress Notes (Signed)
Pt voided 400 cc of urine at 1500. No complaints after removal of catheter at 0845 this am. Dion Saucier

## 2012-01-19 NOTE — Progress Notes (Signed)
Pt ambulated 450 ft with rolling walker x 1 assist. Tolerated well. Dion Saucier

## 2012-01-19 NOTE — Progress Notes (Addendum)
6 Days Post-Op Procedure(s) (LRB): THORACOTOMY MAJOR (Left)  Subjective: Patient pleasantly confused.Has been passing flatus but no bm yet.  Objective: Vital signs in last 24 hours: Patient Vitals for the past 24 hrs:  BP Temp Temp src Pulse Resp SpO2  01/19/12 0525 143/82 mmHg 98.8 F (37.1 C) Oral 89  19  96 %  01/18/12 2147 130/82 mmHg 98.4 F (36.9 C) Oral 88  18  94 %  01/18/12 1357 123/59 mmHg 98.4 F (36.9 C) Oral 85  18  93 %    Current Weight  01/18/12 172 lb 6.4 oz (78.2 kg)      Intake/Output from previous day: 06/25 0701 - 06/26 0700 In: 420 [P.O.:420] Out: 2365 [Urine:2225; Drains:140]   Physical Exam:  Cardiovascular: RRR, no murmurs, gallops, or rubs. Pulmonary: Clear to auscultation bilaterally; no rales, wheezes, or rhonchi. Abdomen: Soft, non tender, bowel sounds present. Extremities: SCDs in place. Wound: Clean and dry.  No erythema or signs of infection.  Lab Results: CBC:  Basename 01/17/12 0330  WBC 8.2  HGB 10.5*  HCT 32.6*  PLT 253   BMET:   Basename 01/17/12 0330  NA 133*  K 4.0  CL 101  CO2 27  GLUCOSE 114*  BUN 11  CREATININE 0.83  CALCIUM 10.6*      Assessment/Plan:  1. CV - SR.Final pathology showed thymoma. 2.  Pulmonary - Encourage incentive spirometer. 3.  Acute blood loss anemia -Last H and H 10.5 and 32.6. 4.Bladder outlet obstruction-foley is still in.Will remove today for voiding trial. 5.Confusion-Only given Ultram.Has a history of dementia. 6.GI-Study showed delayed filling of cystic duct.Cholecystostomy drain exchanged yesterday. 7.LOC constipation. 8.Await SNF bed to be available.    ZIMMERMAN,DONIELLE MPA-C 01/19/2012    Chart reviewed, patient examined, agree with above.

## 2012-01-20 NOTE — Clinical Social Work Placement (Signed)
     Clinical Social Work Department CLINICAL SOCIAL WORK PLACEMENT NOTE 01/20/2012  Patient:  Jose Crawford, Jose Crawford  Account Number:  [id number] Admit date:  01/13/2012  Clinical Social Worker:  Bonnye Fava, LCSW  Date/time:  01/17/2012 02:00 PM  Clinical Social Work is seeking post-discharge placement for this patient at the following level of care:   SKILLED NURSING   (*CSW will update this form in Epic as items are completed)   01/17/2012  Patient/family provided with [facility] Department of Clinical Social Works list of facilities offering this level of care within the geographic area requested by the patient (or if unable, by the patients family).  01/17/2012  Patient/family informed of their freedom to choose among providers that offer the needed level of care, that participate in Medicare, Medicaid or managed care program needed by the patient, have an available bed and are willing to accept the patient.  01/17/2012  Patient/family informed of [facility] ownership interest in [facility], as well as of the fact that they are under no obligation to receive care at this facility.  PASARR submitted to EDS on 01/17/2012 PASARR number received from EDS on   FL2 transmitted to all facilities in geographic area requested by pt/family on  01/17/2012 FL2 transmitted to all facilities within larger geographic area on   Patient informed that his/her managed care company has contracts with or will negotiate with  certain facilities, including the following:     Patient/family informed of bed offers received:  01/19/2012 Patient chooses bed at [facility] Physician recommends and patient chooses bed at    Patient to be transferred to [facility] on  01/20/2012 Patient to be transferred to facility by EMS  The following physician request were entered in Epic:   Additional Comments:

## 2012-01-20 NOTE — Progress Notes (Addendum)
7 Days Post-Op Procedure(s) (LRB): THORACOTOMY MAJOR (Left)  Subjective: Patient pleasantly confused.Had large bowel movement.  Objective: Vital signs in last 24 hours: Patient Vitals for the past 24 hrs:  BP Temp Temp src Pulse Resp SpO2 Weight  01/20/12 0545 138/86 mmHg 97.2 F (36.2 C) Oral 82  18  93 % 170 lb 13.7 oz (77.5 kg)  01/19/12 2017 144/82 mmHg 98.3 F (36.8 C) Oral 82  18  94 % -  01/19/12 1735 - 98.3 F (36.8 C) Oral - - - -  01/19/12 1421 113/70 mmHg - - 81  16  94 % -    Current Weight  01/20/12 170 lb 13.7 oz (77.5 kg)      Intake/Output from previous day: 06/26 0701 - 06/27 0700 In: 120 [P.O.:120] Out: 500 [Urine:400; Drains:100]   Physical Exam:  Cardiovascular: RRR, no murmurs, gallops, or rubs. Pulmonary: Clear to auscultation bilaterally; no rales, wheezes, or rhonchi. Abdomen: Soft, non tender, bowel sounds present. Extremities: SCDs in place. Wound: Clean and dry.  No erythema or signs of infection.  Lab Results: CBC: No results found for this basename: WBC:2,HGB:2,HCT:2,PLT:2 in the last 72 hours BMET:  No results found for this basename: NA:2,K:2,CL:2,CO2:2,GLUCOSE:2,BUN:2,CREATININE:2,CALCIUM:2 in the last 72 hours    Assessment/Plan:  1. CV - SR.Final pathology showed thymoma. 2.  Pulmonary - Encourage incentive spirometer. 3.  Acute blood loss anemia -Last H and H 10.5 and 32.6. 4.Bladder outlet obstruction-foley is still in.Will remove today for voiding trial. 5.Confusion-Only given Ultram.Has a history of dementia. 6.GI-Study showed delayed filling of cystic duct.Cholecystostomy drain exchanged 6/25. 7.Await SNF bed to be available or if son is willing to take him then that is ok as well.    ZIMMERMAN,DONIELLE MPA-C 01/20/2012    Chart reviewed, patient examined, agree with above.

## 2012-01-29 ENCOUNTER — Inpatient Hospital Stay (HOSPITAL_COMMUNITY)
Admission: EM | Admit: 2012-01-29 | Discharge: 2012-02-04 | DRG: 414 | Disposition: A | Discharge status: Skilled Nursing Facility | Payer: Medicare Other | Attending: Internal Medicine | Admitting: Internal Medicine

## 2012-01-29 ENCOUNTER — Encounter (HOSPITAL_COMMUNITY): Payer: Self-pay | Admitting: Internal Medicine

## 2012-01-29 ENCOUNTER — Inpatient Hospital Stay (HOSPITAL_COMMUNITY): Payer: Medicare Other

## 2012-01-29 ENCOUNTER — Emergency Department (HOSPITAL_COMMUNITY): Payer: Medicare Other

## 2012-01-29 DIAGNOSIS — K651 Peritoneal abscess: Secondary | ICD-10-CM | POA: Diagnosis present

## 2012-01-29 DIAGNOSIS — D630 Anemia in neoplastic disease: Secondary | ICD-10-CM | POA: Diagnosis present

## 2012-01-29 DIAGNOSIS — F3289 Other specified depressive episodes: Secondary | ICD-10-CM | POA: Diagnosis present

## 2012-01-29 DIAGNOSIS — C37 Malignant neoplasm of thymus: Secondary | ICD-10-CM | POA: Diagnosis present

## 2012-01-29 DIAGNOSIS — K828 Other specified diseases of gallbladder: Secondary | ICD-10-CM | POA: Diagnosis present

## 2012-01-29 DIAGNOSIS — D4989 Neoplasm of unspecified behavior of other specified sites: Secondary | ICD-10-CM

## 2012-01-29 DIAGNOSIS — Z66 Do not resuscitate: Secondary | ICD-10-CM | POA: Diagnosis present

## 2012-01-29 DIAGNOSIS — D649 Anemia, unspecified: Secondary | ICD-10-CM

## 2012-01-29 DIAGNOSIS — R509 Fever, unspecified: Secondary | ICD-10-CM

## 2012-01-29 DIAGNOSIS — N200 Calculus of kidney: Secondary | ICD-10-CM

## 2012-01-29 DIAGNOSIS — K5900 Constipation, unspecified: Secondary | ICD-10-CM

## 2012-01-29 DIAGNOSIS — K8050 Calculus of bile duct without cholangitis or cholecystitis without obstruction: Secondary | ICD-10-CM

## 2012-01-29 DIAGNOSIS — IMO0002 Reserved for concepts with insufficient information to code with codable children: Secondary | ICD-10-CM

## 2012-01-29 DIAGNOSIS — K660 Peritoneal adhesions (postprocedural) (postinfection): Secondary | ICD-10-CM | POA: Diagnosis present

## 2012-01-29 DIAGNOSIS — F0390 Unspecified dementia without behavioral disturbance: Secondary | ICD-10-CM

## 2012-01-29 DIAGNOSIS — R918 Other nonspecific abnormal finding of lung field: Secondary | ICD-10-CM

## 2012-01-29 DIAGNOSIS — C61 Malignant neoplasm of prostate: Secondary | ICD-10-CM

## 2012-01-29 DIAGNOSIS — E871 Hypo-osmolality and hyponatremia: Secondary | ICD-10-CM

## 2012-01-29 DIAGNOSIS — R339 Retention of urine, unspecified: Secondary | ICD-10-CM | POA: Diagnosis present

## 2012-01-29 DIAGNOSIS — K8000 Calculus of gallbladder with acute cholecystitis without obstruction: Principal | ICD-10-CM

## 2012-01-29 DIAGNOSIS — F329 Major depressive disorder, single episode, unspecified: Secondary | ICD-10-CM | POA: Diagnosis present

## 2012-01-29 DIAGNOSIS — E213 Hyperparathyroidism, unspecified: Secondary | ICD-10-CM | POA: Diagnosis present

## 2012-01-29 DIAGNOSIS — Z8546 Personal history of malignant neoplasm of prostate: Secondary | ICD-10-CM

## 2012-01-29 HISTORY — DX: Hyperparathyroidism, unspecified: E21.3

## 2012-01-29 HISTORY — DX: Cholecystitis, unspecified: K81.9

## 2012-01-29 LAB — LACTIC ACID, PLASMA: Lactic Acid, Venous: 1.1 mmol/L (ref 0.5–2.2)

## 2012-01-29 LAB — CBC WITH DIFFERENTIAL/PLATELET
Basophils Absolute: 0 10*3/uL (ref 0.0–0.1)
HCT: 38.5 % — ABNORMAL LOW (ref 39.0–52.0)
Hemoglobin: 12.9 g/dL — ABNORMAL LOW (ref 13.0–17.0)
Lymphocytes Relative: 3 % — ABNORMAL LOW (ref 12–46)
Monocytes Absolute: 1.6 10*3/uL — ABNORMAL HIGH (ref 0.1–1.0)
Neutro Abs: 26.3 10*3/uL — ABNORMAL HIGH (ref 1.7–7.7)
RDW: 14.2 % (ref 11.5–15.5)
WBC: 28.8 10*3/uL — ABNORMAL HIGH (ref 4.0–10.5)

## 2012-01-29 LAB — COMPREHENSIVE METABOLIC PANEL
ALT: 11 U/L (ref 0–53)
AST: 15 U/L (ref 0–37)
Albumin: 3.3 g/dL — ABNORMAL LOW (ref 3.5–5.2)
Alkaline Phosphatase: 63 U/L (ref 39–117)
BUN: 16 mg/dL (ref 6–23)
Chloride: 98 mEq/L (ref 96–112)
Potassium: 3.8 mEq/L (ref 3.5–5.1)
Sodium: 131 mEq/L — ABNORMAL LOW (ref 135–145)
Total Bilirubin: 1.1 mg/dL (ref 0.3–1.2)

## 2012-01-29 LAB — URINALYSIS, ROUTINE W REFLEX MICROSCOPIC
Glucose, UA: NEGATIVE mg/dL
Hgb urine dipstick: NEGATIVE
Protein, ur: NEGATIVE mg/dL

## 2012-01-29 MED ORDER — SODIUM CHLORIDE 0.9 % IV BOLUS (SEPSIS)
500.0000 mL | Freq: Once | INTRAVENOUS | Status: AC
  Administered 2012-01-29: 15:00:00 via INTRAVENOUS

## 2012-01-29 MED ORDER — SODIUM CHLORIDE 0.9 % IV SOLN
INTRAVENOUS | Status: DC
  Administered 2012-01-29: 400 mL via INTRAVENOUS
  Administered 2012-01-30: 04:00:00 via INTRAVENOUS
  Administered 2012-01-30 – 2012-01-31 (×2): 50 mL/h via INTRAVENOUS
  Administered 2012-01-31 – 2012-02-03 (×5): via INTRAVENOUS

## 2012-01-29 MED ORDER — NYSTATIN 100000 UNIT/GM EX CREA
1.0000 "application " | TOPICAL_CREAM | Freq: Two times a day (BID) | CUTANEOUS | Status: DC
  Administered 2012-01-29 – 2012-02-04 (×11): 1 via TOPICAL
  Filled 2012-01-29 (×2): qty 15

## 2012-01-29 MED ORDER — PIPERACILLIN-TAZOBACTAM 3.375 G IVPB
3.3750 g | Freq: Once | INTRAVENOUS | Status: AC
  Administered 2012-01-29: 3.375 g via INTRAVENOUS
  Filled 2012-01-29: qty 50

## 2012-01-29 MED ORDER — SODIUM CHLORIDE 0.9 % IV SOLN: INTRAVENOUS | Status: AC

## 2012-01-29 MED ORDER — IOHEXOL 300 MG/ML  SOLN
100.0000 mL | Freq: Once | INTRAMUSCULAR | Status: AC | PRN
  Administered 2012-01-29: 100 mL via INTRAVENOUS

## 2012-01-29 MED ORDER — ONDANSETRON HCL 4 MG/2ML IJ SOLN
4.0000 mg | Freq: Four times a day (QID) | INTRAMUSCULAR | Status: DC | PRN
  Administered 2012-01-30: 4 mg via INTRAVENOUS
  Filled 2012-01-29: qty 2

## 2012-01-29 MED ORDER — ACETAMINOPHEN 325 MG PO TABS
650.0000 mg | ORAL_TABLET | Freq: Four times a day (QID) | ORAL | Status: DC | PRN
  Administered 2012-01-30 – 2012-02-03 (×7): 650 mg via ORAL
  Filled 2012-01-29 (×7): qty 2

## 2012-01-29 MED ORDER — PIPERACILLIN-TAZOBACTAM 3.375 G IVPB
3.3750 g | Freq: Three times a day (TID) | INTRAVENOUS | Status: DC
  Administered 2012-01-29 – 2012-02-01 (×8): 3.375 g via INTRAVENOUS
  Filled 2012-01-29 (×9): qty 50

## 2012-01-29 MED ORDER — TRAMADOL HCL 50 MG PO TABS
50.0000 mg | ORAL_TABLET | Freq: Four times a day (QID) | ORAL | Status: DC | PRN
  Administered 2012-01-29 – 2012-01-30 (×2): 100 mg via ORAL
  Administered 2012-01-30: 50 mg via ORAL
  Administered 2012-01-30 – 2012-02-02 (×8): 100 mg via ORAL
  Administered 2012-02-02 (×2): 50 mg via ORAL
  Administered 2012-02-02: 100 mg via ORAL
  Administered 2012-02-02: 50 mg via ORAL
  Administered 2012-02-03 (×2): 100 mg via ORAL
  Administered 2012-02-04: 50 mg via ORAL
  Filled 2012-01-29 (×4): qty 2
  Filled 2012-01-29: qty 1
  Filled 2012-01-29 (×5): qty 2
  Filled 2012-01-29: qty 1
  Filled 2012-01-29 (×2): qty 2
  Filled 2012-01-29: qty 1
  Filled 2012-01-29: qty 2
  Filled 2012-01-29: qty 1
  Filled 2012-01-29 (×2): qty 2

## 2012-01-29 MED ORDER — ACETAMINOPHEN 650 MG RE SUPP: 650.0000 mg | Freq: Four times a day (QID) | RECTAL | Status: DC | PRN

## 2012-01-29 MED ORDER — VANCOMYCIN HCL IN DEXTROSE 1-5 GM/200ML-% IV SOLN
1000.0000 mg | Freq: Two times a day (BID) | INTRAVENOUS | Status: DC
  Administered 2012-01-30 – 2012-02-01 (×5): 1000 mg via INTRAVENOUS
  Filled 2012-01-29 (×6): qty 200

## 2012-01-29 MED ORDER — POLYETHYLENE GLYCOL 3350 17 G PO PACK
17.0000 g | PACK | Freq: Every day | ORAL | Status: DC | PRN
  Filled 2012-01-29: qty 1

## 2012-01-29 MED ORDER — ENOXAPARIN SODIUM 40 MG/0.4ML ~~LOC~~ SOLN
40.0000 mg | SUBCUTANEOUS | Status: DC
  Administered 2012-01-29: 40 mg via SUBCUTANEOUS
  Filled 2012-01-29 (×2): qty 0.4

## 2012-01-29 MED ORDER — VANCOMYCIN HCL IN DEXTROSE 1-5 GM/200ML-% IV SOLN
1000.0000 mg | Freq: Once | INTRAVENOUS | Status: AC
Start: 2012-01-29 — End: 2012-01-29
  Administered 2012-01-29: 1000 mg via INTRAVENOUS
  Filled 2012-01-29: qty 200

## 2012-01-29 MED ORDER — SODIUM CHLORIDE 0.9 % IV SOLN: Freq: Once | INTRAVENOUS | Status: DC

## 2012-01-29 MED ORDER — ALUM & MAG HYDROXIDE-SIMETH 200-200-20 MG/5ML PO SUSP: 30.0000 mL | Freq: Four times a day (QID) | ORAL | Status: DC | PRN

## 2012-01-29 MED ORDER — ONDANSETRON HCL 4 MG PO TABS: 4.0000 mg | ORAL_TABLET | Freq: Four times a day (QID) | ORAL | Status: DC | PRN

## 2012-01-29 NOTE — ED Notes (Signed)
Pt had biliary tube that apparently came out today.

## 2012-01-29 NOTE — Progress Notes (Signed)
ANTIBIOTIC CONSULT NOTE - INITIAL  Pharmacy Consult for Vancomycin & Zosyn Indication: Fever of unknown origin  Allergies  Allergen Reactions  . Other Other (See Comments)    All narcotics. Pt. Is hard to manage    Patient Measurements:    Vital Signs: Temp: 98.1 F (36.7 C) (07/06 1627) Temp src: Oral (07/06 1627) BP: 98/59 mmHg (07/06 1627) Pulse Rate: 76  (07/06 1627) Intake/Output from previous day:   Intake/Output from this shift:    Labs:  Basename 01/29/12 1422  WBC 28.8*  HGB 12.9*  PLT 403*  LABCREA --  CREATININE 0.89   The CrCl is unknown because both a height and weight (above a minimum accepted value) are required for this calculation. No results found for this basename: VANCOTROUGH:2,VANCOPEAK:2,VANCORANDOM:2,GENTTROUGH:2,GENTPEAK:2,GENTRANDOM:2,TOBRATROUGH:2,TOBRAPEAK:2,TOBRARND:2,AMIKACINPEAK:2,AMIKACINTROU:2,AMIKACIN:2, in the last 72 hours   Microbiology: Recent Results (from the past 720 hour(s))  SURGICAL PCR SCREEN     Status: Abnormal   Collection Time   01/12/12  1:30 PM      Component Value Range Status Comment   MRSA, PCR NEGATIVE  NEGATIVE Final    Staphylococcus aureus POSITIVE (*) NEGATIVE Final     Medical History: Past Medical History  Diagnosis Date  . Thymoma     s/p resection  . Acute cholecystitis 11/2011    s/p perc drain 12/03/11  . Basal cell carcinoma     multiple areas  . Prostate cancer   . Stroke   . Arthritis   . Depression   . Dementia     Short term  . Head injury     Concussion- has had short term memory and it deterates .  Marland Kitchen Cholecystitis     S/P cholecytostomy tube  . Dementia   . Stroke     Medications:  Prescriptions prior to admission  Medication Sig Dispense Refill  . acetaminophen (TYLENOL) 500 MG tablet Take 1,000 mg by mouth every 6 (six) hours as needed. For pain      . nystatin cream (MYCOSTATIN) Apply 1 application topically 2 (two) times daily. To groin.      . polyethylene glycol  (MIRALAX / GLYCOLAX) packet Take 17 g by mouth daily as needed. Constipation.      . traMADol (ULTRAM) 50 MG tablet Take 50-100 mg by mouth every 6 (six) hours as needed. Pain.      Marland Kitchen zolpidem (AMBIEN) 5 MG tablet Take 5 mg by mouth at bedtime as needed. Sleep.      . traMADol (ULTRAM) 50 MG tablet Take 1-2 tablets (50-100 mg total) by mouth every 6 (six) hours as needed.  40 tablet  0   Anti-infectives     Start     Dose/Rate Route Frequency Ordered Stop   01/29/12 1515   vancomycin (VANCOCIN) IVPB 1000 mg/200 mL premix        1,000 mg 200 mL/hr over 60 Minutes Intravenous  Once 01/29/12 1506     01/29/12 1515   piperacillin-tazobactam (ZOSYN) IVPB 3.375 g        3.375 g 100 mL/hr over 30 Minutes Intravenous  Once 01/29/12 1506 01/29/12 1639         Assessment:  78yo M admitted from SNF with fever.  Patient has severe dementia and recently pulled out his cholecystostomy tube.  Also he is s/p resection of a thymoma on 6/20.    Starting broad-spectrum empiric Vanc and Zosyn.  SCr wnl, CrCl 63N.  Weight from June: 78kg.  Goal of Therapy:  Vancomycin trough level  15-20 mcg/ml  Plan:   Zosyn 3.375g IV Q8H infused over 4hrs.  Vancomycin 1g IV q12h.  Measure Vanc trough at steady state.  Follow up renal fxn and culture results.  Charolotte Eke, PharmD, pager [phone]. 01/29/2012,5:35 PM.

## 2012-01-29 NOTE — ED Notes (Signed)
Received per [facility] from [facility]. Pt has fever of 101 today. Unknown if was given Tylenol. Pt s/p thoracotomy 12/2011 per Dr. Laneta Simmers for thymoma.

## 2012-01-29 NOTE — ED Notes (Signed)
ZOX:[id number]<AV> Expected date:01/29/12<BR> Expected time: 1:34 PM<BR> Means of arrival:Ambulance<BR> Comments:<BR> fever

## 2012-01-29 NOTE — Progress Notes (Signed)
Pt arrived back from CT scan.

## 2012-01-29 NOTE — Progress Notes (Signed)
Pt down to CT for scan of ABD. Taken down in wheelchair by transporter, alert and talking.

## 2012-01-29 NOTE — ED Provider Notes (Signed)
History     CSN: [id number]  Arrival date & time 01/29/12  1340   First MD Initiated Contact with Patient 01/29/12 1411      Chief Complaint  Patient presents with  . Fever    (Consider location/radiation/quality/duration/timing/severity/associated sxs/prior treatment) Patient is a 78 y.o. male presenting with fever. The history is provided by the patient. The history is limited by the condition of the patient.  Fever Primary symptoms of the febrile illness include fever.   patient here from nursing home with fever up to 101. Symptoms started today. Patient is a poor historian, but, according to nursing home records he has not had a cough, he has been short of breath. It is unknown if he has had any vomiting or diarrhea. Was given Tylenol for his fever possibly prior to arrival. Patient had a thoracotomy back in June for thymoma  Past Medical History  Diagnosis Date  . Thymoma   . Acute cholecystitis 11/2011    s/p perc drain 12/03/11  . Basal cell carcinoma     multiple areas  . Prostate cancer   . Stroke   . Arthritis   . Depression   . Dementia     Short term  . Head injury     Concussion- has had short term memory and it deterates .    Past Surgical History  Procedure Date  . Prostatectomy   . Colon surgery 1994    Removal of parts of colon  . Thoracotomy 01/13/2012    Procedure: THORACOTOMY MAJOR;  Surgeon: Alleen Borne, MD;  [facility];  Service: Thoracic;  Laterality: Left;  LEFT THORACOTOMY, RESECTION OF THYMOMA    No family history on file.  History  Substance Use Topics  . Smoking status: Former Smoker    Types: Cigarettes    Quit date: 07/26/1968  . Smokeless tobacco: Never Used  . Alcohol Use: No      Review of Systems  Unable to perform ROS Constitutional: Positive for fever.    Allergies  Other  Home Medications   Current Outpatient Rx  Name Route Sig Dispense Refill  . ACETAMINOPHEN 500 MG PO TABS Oral Take 1,000 mg by mouth  every 6 (six) hours as needed. For pain    . NYSTATIN 100000 UNIT/GM EX CREA Topical Apply topically 2 (two) times daily. To groin 30 g 0  . TRAMADOL HCL 50 MG PO TABS Oral Take 1-2 tablets (50-100 mg total) by mouth every 6 (six) hours as needed. 40 tablet 0    BP 99/51  Pulse 90  Temp 100.7 F (38.2 C) (Rectal)  Resp 16  SpO2 92%  Physical Exam  Nursing note and vitals reviewed. Constitutional: He appears well-developed and well-nourished. He appears lethargic.  Non-toxic appearance. No distress.  HENT:  Head: Normocephalic and atraumatic.  Eyes: Conjunctivae, EOM and lids are normal. Pupils are equal, round, and reactive to light.  Neck: Normal range of motion. Neck supple. No tracheal deviation present. No mass present.  Cardiovascular: Normal rate, regular rhythm and normal heart sounds.  Exam reveals no gallop.   No murmur heard. Pulmonary/Chest: Effort normal. No stridor. No respiratory distress. He has decreased breath sounds. He has no wheezes. He has no rhonchi. He has no rales.       Decreased breath sounds right hemithorax  Abdominal: Soft. Normal appearance and bowel sounds are normal. He exhibits no distension. There is no tenderness. There is no rebound and no CVA tenderness.  Musculoskeletal:  Normal range of motion. He exhibits no edema and no tenderness.  Neurological: He appears lethargic. No cranial nerve deficit or sensory deficit. GCS eye subscore is 4. GCS verbal subscore is 5. GCS motor subscore is 6.  Skin: Skin is warm and dry. No abrasion and no rash noted.  Psychiatric: His affect is blunt. His speech is delayed. He is slowed.    ED Course  Procedures (including critical care time)   Labs Reviewed  CBC WITH DIFFERENTIAL  LACTIC ACID, PLASMA  COMPREHENSIVE METABOLIC PANEL  URINALYSIS, ROUTINE W REFLEX MICROSCOPIC  URINE CULTURE  CULTURE, BLOOD (ROUTINE X 2)  CULTURE, BLOOD (ROUTINE X 2)   No results found.   No diagnosis found.    MDM    Patient started on vancomycin and Zosyn. Blood cultures obtained. Chest x-ray without acute findings and urinalysis negative. Patient with significant leukocytosis. Repeat blood pressure remains above 100. He'll be admitted by triad hospitalist        Toy Baker, MD 01/29/12 1515

## 2012-01-29 NOTE — ED Notes (Signed)
Called for pumps  

## 2012-01-29 NOTE — H&P (Addendum)
Hospital Admission Note Date: 01/29/2012  Patient name: Jose Crawford Medical record number: [id number] Date of birth: 10/24/1925 Age: 78 y.o. Gender: male PCP: Georgann Housekeeper, MD  Attending physician: Maryruth Bun Demitrious Mccannon, MD Emergency Contact: Hurley Cisco [phone] Code Status:  DNR  Chief Complaint: Fever  History of Present Illness: Jose Crawford is an 78 y.o. male with a PMH of thymoma status post thoracotomy on 01/13/12 and cholecystitis treated with a cholecystostomy tube as well as advanced dementia who was sent to the hospital from his SNF with fever.  The patient does feel feverish but denies chills.  No complaints of pain.  Some nausea but no vomiting.  The history is limited due to the patient's advanced dementia.  According to the patient's son, he pulled out his cholecystostomy tube yesterday or today.  Past Medical History Past Medical History  Diagnosis Date  . Thymoma     s/p resection  . Acute cholecystitis 11/2011    s/p perc drain 12/03/11  . Basal cell carcinoma     multiple areas  . Prostate cancer   . Stroke   . Arthritis   . Depression   . Dementia     Short term  . Head injury     Concussion- has had short term memory and it deterates .  Marland Kitchen Cholecystitis     S/P cholecytostomy tube  . Dementia   . Stroke     Past Surgical History Past Surgical History  Procedure Date  . Prostatectomy   . Colon surgery 1994    Removal of parts of colon  . Thoracotomy 01/13/2012    Procedure: THORACOTOMY MAJOR;  Surgeon: Alleen Borne, MD;  Location[facility] OR;  Service: Thoracic;  Laterality: Left;  LEFT THORACOTOMY, RESECTION OF THYMOMA    Meds: Prior to Admission medications   Medication Sig Start Date End Date Taking? Authorizing Provider  acetaminophen (TYLENOL) 500 MG tablet Take 1,000 mg by mouth every 6 (six) hours as needed. For pain   Yes Historical Provider, MD  nystatin cream (MYCOSTATIN) Apply 1 application topically 2 (two) times daily. To groin.   Yes  Historical Provider, MD  polyethylene glycol (MIRALAX / GLYCOLAX) packet Take 17 g by mouth daily as needed. Constipation.   Yes Historical Provider, MD  traMADol (ULTRAM) 50 MG tablet Take 50-100 mg by mouth every 6 (six) hours as needed. Pain.   Yes Historical Provider, MD  zolpidem (AMBIEN) 5 MG tablet Take 5 mg by mouth at bedtime as needed. Sleep.   Yes Historical Provider, MD  traMADol (ULTRAM) 50 MG tablet Take 1-2 tablets (50-100 mg total) by mouth every 6 (six) hours as needed. 01/18/12 01/28/12  Donielle Margaretann Loveless, PA    Allergies: Other  Social History: History   Social History  . Marital Status: Widowed    Spouse Name: N/A    Number of Children: 1  . Years of Education: N/A   Occupational History  . Retired Psychologist, sport and exercise, Airline pilot    Social History Main Topics  . Smoking status: Former Smoker    Types: Cigarettes    Quit date: 07/26/1968  . Smokeless tobacco: Never Used  . Alcohol Use: No  . Drug Use: No  . Sexually Active: Not on file   Other Topics Concern  . Not on file   Social History Narrative   Widowed.  SNF ([facility]) resident.  Ambulates with a walker at baseline.  DNR.    Family History:  Family History  Problem  Relation Age of Onset  . Cancer Mother   . Aneurysm Mother     Review of Systems: Constitutional: + fever, no chills;  Appetite normal; +weight loss, no weight gain.  HEENT: No blurry vision, no diplopia, no pharyngitis, no dysphagia; Wears glasses CV: No chest pain, no palpitations.  Resp: No SOB, no cough. GI: + nausea, no vomiting, no diarrhea, no melena, no hematochezia.  GU: No dysuria, no hematuria.  MSK: no myalgias, no arthralgias.  Neuro:  No headache, no focal neurological deficits, no history of seizures.  Psych: No depression, no anxiety.  Endo: No thyroid disease, no DM, no heat intolerance, no cold intolerance, no polyuria, no polydipsia  Skin: No rashes, no skin lesions.  Heme: No easy bruising, no history of blood  diseases.   Physical Exam: Blood pressure 98/59, pulse 76, temperature 98.1 F (36.7 C), temperature source Oral, resp. rate 16, SpO2 97.00%. BP 98/59  Pulse 76  Temp 98.1 F (36.7 C) (Oral)  Resp 16  SpO2 97%  General Appearance:    Alert, demented, no distress, appears stated age  Head:    Normocephalic, without obvious abnormality, atraumatic  Eyes:    PERRL, conjunctiva/corneas clear, EOM's intact     Ears:    Normal external ear canals, both ears  Nose:   Nares normal, septum midline, mucosa normal, no drainage    or sinus tenderness  Throat:   Lips, mucosa, and tongue normal; teeth fair  Neck:   Supple, symmetrical, trachea midline, no adenopathy;       thyroid:  No enlargement/tenderness/nodules; no carotid   bruit or JVD  Back:     Symmetric, no curvature, ROM normal, no CVA tenderness, thoracotomy site left flank without signs of infection, no drainage from wound, no erythema, sutures intact.  Lungs:     Clear to auscultation bilaterally, respirations unlabored  Chest wall:    No tenderness or deformity  Heart:    Regular rate and rhythm, S1 and S2 normal, no murmur, rub   or gallop  Abdomen:     Soft, non-tender, bowel sounds active all four quadrants,    no masses, no organomegaly, no cholecystostomy tube.  Rectal:    Deferred  Extremities:   Extremities normal, atraumatic, no cyanosis, 2+ edema  Pulses:   2+ and symmetric all extremities  Skin:   Skin color, texture, turgor normal, no rashes or lesions  Lymph nodes:   Cervical, supraclavicular, and axillary nodes normal  Neurologic:   CNII-XII intact. Normal strength, A+O x 1 only.  Confused.   Lab results: Basic Metabolic Panel:  Lab 01/29/12 4098  NA 131*  K 3.8  CL 98  CO2 23  GLUCOSE 131*  BUN 16  CREATININE 0.89  CALCIUM 11.4*  MG --  PHOS --   GFR The CrCl is unknown because both a height and weight (above a minimum accepted value) are required for this calculation. Liver Function Tests:  Lab  01/29/12 1422  AST 15  ALT 11  ALKPHOS 63  BILITOT 1.1  PROT 6.3  ALBUMIN 3.3*    CBC:  Lab 01/29/12 1422  WBC 28.8*  NEUTROABS 26.3*  HGB 12.9*  HCT 38.5*  MCV 91.9  PLT 403*   Urinalysis    Component Value Date/Time   COLORURINE AMBER* 01/29/2012 1444   APPEARANCEUR CLOUDY* 01/29/2012 1444   LABSPEC 1.026 01/29/2012 1444   PHURINE 5.5 01/29/2012 1444   GLUCOSEU NEGATIVE 01/29/2012 1444   HGBUR NEGATIVE 01/29/2012 1444  BILIRUBINUR SMALL* 01/29/2012 1444   KETONESUR TRACE* 01/29/2012 1444   PROTEINUR NEGATIVE 01/29/2012 1444   UROBILINOGEN 1.0 01/29/2012 1444   NITRITE NEGATIVE 01/29/2012 1444   LEUKOCYTESUR NEGATIVE 01/29/2012 1444      Imaging results:  Dg Chest 2 View  01/29/2012  *RADIOLOGY REPORT*  Clinical Data: Chest pain.  Shortness of breath.  Recent thoracotomy for left-sided mediastinal mass.  CHEST - 2 VIEW  Comparison: Portable chest x-rays 01/17/2012 dating back to 01/13/2012.  Two-view chest x-ray 01/12/2012.  Findings: Post-surgical changes along the left side the mediastinum post resection of the mediastinal mass.  Resolution of the left pneumothorax since the 01/17/2012 examination.  Small bilateral pleural effusions and associated mild passive atelectasis in the lower lobes.  Lungs otherwise clear.  Cardiac silhouette normal in size, unchanged.  Pulmonary vascularity normal.  Visualized bony thorax intact.  IMPRESSION: Resolution of the left pneumothorax since 01/17/2012.  Residual small bilateral pleural effusions and associated passive atelectasis in the lower lobes.  No acute cardiopulmonary disease otherwise.  Original Report Authenticated By: Arnell Sieving, M.D.    Assessment & Plan: Principal Problem:  *FUO (fever of unknown origin)  May be post-op wound infection from recent thoracotomy (although no signs of local wound infection at surgical site).  CXR clear.  Will get CT chest to elucidate further.  No LFT elevation or tenderness in the RUQ upon palpation to  suggest recurrent cholecystitis, but would image abdomen if chest CT unrevealing.  U/A negative for nitrites, LE.  Blood cultures and urine cultures sent.  Empiric Vancomycin/Zosyn given recent hospitalization and high risk for resistant organisms. Active Problems:  Recent Acute calculous cholecystitis  No current drain in place or elevation of LFTs.  May need further imaging as noted above.  Dementia  Advanced.  Disoriented, anxious.  Return to SNF when stable.  Thymoma  Status post resection/thoracotomy.  F/U with [name] after discharge.  Hyponatremia  Maybe SIADH from malignancy versus dehydration.   Hydrate and monitor.  Prophylaxis:  Lovenox for DVT prophylaxis.  Time Spent On Admission: 1 hour.  Sugar Vanzandt 01/29/2012, 4:59 PM Pager [phone]

## 2012-01-30 DIAGNOSIS — K8000 Calculus of gallbladder with acute cholecystitis without obstruction: Secondary | ICD-10-CM

## 2012-01-30 DIAGNOSIS — R509 Fever, unspecified: Secondary | ICD-10-CM

## 2012-01-30 DIAGNOSIS — D649 Anemia, unspecified: Secondary | ICD-10-CM | POA: Diagnosis present

## 2012-01-30 LAB — MRSA PCR SCREENING: MRSA by PCR: NEGATIVE

## 2012-01-30 LAB — BASIC METABOLIC PANEL
CO2: 23 mEq/L (ref 19–32)
Chloride: 99 mEq/L (ref 96–112)
Glucose, Bld: 135 mg/dL — ABNORMAL HIGH (ref 70–99)
Potassium: 3.7 mEq/L (ref 3.5–5.1)
Sodium: 129 mEq/L — ABNORMAL LOW (ref 135–145)

## 2012-01-30 LAB — CBC
HCT: 32.5 % — ABNORMAL LOW (ref 39.0–52.0)
Hemoglobin: 10.9 g/dL — ABNORMAL LOW (ref 13.0–17.0)
MCH: 30.6 pg (ref 26.0–34.0)
MCV: 91.3 fL (ref 78.0–100.0)
RBC: 3.56 MIL/uL — ABNORMAL LOW (ref 4.22–5.81)

## 2012-01-30 NOTE — Progress Notes (Signed)
PROGRESS NOTE  Jose Crawford ION:[id number] DOB: 1925/12/13 DOA: 01/29/2012 PCP: Georgann Housekeeper, MD  Brief narrative: Jose Crawford is an 78 y.o. male with a PMH of thymoma status post thoracotomy on 01/13/12 and cholecystitis treated with a cholecystostomy tube as well as advanced dementia who was sent to the hospital from his SNF with fever that began after pulling out his cholecystostomy tube.   Interim History: CT scans of the chest and abdomen done last night, confirm fever source as likely stemming from cholecystitis.  Surgical consultation requested.  Spoke with Interventional Radiologist ([name]) who states that the cholecystostomy tube was originally placed 12/03/11, and that he has pulled it out in the past.  Recommend surgical evaluation before proceeding with tube replacement, since he is now far enough out that surgery could be considered.  Assessment/Plan: Principal Problem:  *Fever secondary to recurrent cholecystitis  Admitted and a diagnostic evaluation confirmed recurrent cholecystitis as the probable source of fever.  Placed on empiric Vancomycin and Zosyn.  Discussed case with [name] on 01/29/12 (will evaluate today) and with [name] who recommends surgical evaluation since cholecystostomy tube was originally placed 12/03/11.   Active Problems:  Dementia   Advanced. Disoriented, anxious.   Return to SNF when stable. Thymoma   Status post resection/thoracotomy. No evidence of post operative wound infection.  F/U with [name] after discharge. Hyponatremia   Maybe SIADH from malignancy versus dehydration.   Hydrate and monitor.  Hypercalcemia likely secondary to dehydration  Calcium normalized with IVF.  Normocytic anemia  Slight drop in hemoglobin likely dilutional, with underlying AOCD contributory.  Code Status: DNR Family Communication: Spoke with son on phone 01/29/12. Disposition Plan: SNF when stable.  Medical Consultants:  Dr.  Abbey Chatters, Surgery  [name], Interventional Radiology  Other consultants:  Physical therapy  Occupational therapy  Antibiotics:  Vancomycin 01/29/12--->  Zosyn 01/29/12--->   Subjective  Mr. [name] is pleasantly confused.  He is without complaints.  Specifically, denies dyspnea, nausea, vomiting, pain.   Objective   Objective: Filed Vitals:   01/29/12 1627 01/29/12 1730 01/29/12 2055 01/30/12 0510  BP: 98/59 103/61 130/74 103/64  Pulse: 76 78 89 81  Temp: 98.1 F (36.7 C) 98.2 F (36.8 C) 97.9 F (36.6 C) 98.3 F (36.8 C)  TempSrc: Oral Axillary Oral Oral  Resp:  16 16 16   Height:  6\' 1"  (1.854 m)    Weight:  73.1 kg (161 lb 2.5 oz)    SpO2: 97% 97% 98% 94%    Intake/Output Summary (Last 24 hours) at 01/30/12 2440 Last data filed at 01/30/12 0547  Gross per 24 hour  Intake  687.5 ml  Output    600 ml  Net   87.5 ml    Exam: Gen:  NAD, pleasantly confused Cardiovascular:  RRR, No M/R/G Respiratory: Lungs CTAB Gastrointestinal: Abdomen soft, tender to palpation, RUQ. Extremities: No C/E/C  Data Reviewed: Basic Metabolic Panel:  Lab 01/30/12 1027 01/29/12 1422  NA 129* 131*  K 3.7 3.8  CL 99 98  CO2 23 23  GLUCOSE 135* 131*  BUN 16 16  CREATININE 0.89 0.89  CALCIUM 10.3 11.4*  MG -- --  PHOS -- --   GFR Estimated Creatinine Clearance: 63.9 ml/min (by C-G formula based on Cr of 0.89). Liver Function Tests:  Lab 01/29/12 1422  AST 15  ALT 11  ALKPHOS 63  BILITOT 1.1  PROT 6.3  ALBUMIN 3.3*   CBC:  Lab 01/30/12 0429 01/29/12 1422  WBC  22.0* 28.8*  NEUTROABS -- 26.3*  HGB 10.9* 12.9*  HCT 32.5* 38.5*  MCV 91.3 91.9  PLT 349 403*   Microbiology No results found for this or any previous visit (from the past 240 hour(s)).  Procedures and Diagnostic Studies:  Dg Chest 2 View 01/29/2012  IMPRESSION: Resolution of the left pneumothorax since 01/17/2012.  Residual small bilateral pleural effusions and associated passive atelectasis in the  lower lobes.  No acute cardiopulmonary disease otherwise.  Original Report Authenticated By: Arnell Sieving, M.D.    Ct Chest W Contrast 01/29/2012   IMPRESSION:  1.  No evidence of empyema or pulmonary abscess following thymoma resection.  2.  Bibasilar atelectasis and small effusions. 3.  Stable ascending aortic aneurysm. Original Report Authenticated By: Genevive Bi, M.D.    Ct Abdomen W Contrast 01/29/2012  IMPRESSION:  1.  New fluid collection along the right lateral hepatic lobe adjacent to gallbladder fossa but could represent an early abscess. This is in the course of the prior cholecystostomy tube. 2.  The gallbladder is decompressed and thick-walled.  Original Report Authenticated By: Genevive Bi, M.D.   Scheduled Meds:    . sodium chloride   Intravenous STAT  . enoxaparin (LOVENOX) injection  40 mg Subcutaneous Q24H  . nystatin cream  1 application Topical BID  . piperacillin-tazobactam (ZOSYN)  IV  3.375 g Intravenous Once  . piperacillin-tazobactam (ZOSYN)  IV  3.375 g Intravenous Q8H  . sodium chloride  500 mL Intravenous Once  . vancomycin  1,000 mg Intravenous Once  . vancomycin  1,000 mg Intravenous Q12H  . DISCONTD: sodium chloride   Intravenous Once   Continuous Infusions:    . sodium chloride 50 mL/hr at 01/30/12 0403      LOS: 1 day   Hillery Aldo, MD Pager [phone]  01/30/2012, 8:07 AM

## 2012-01-30 NOTE — Progress Notes (Signed)
Phone call to Elray Mcgregor notified CT results were available. Reported she would review.

## 2012-01-30 NOTE — Progress Notes (Signed)
CSW met with Pt. Pt was not able to communicate with CSW at this time.   CSW contacted son-in-law (via daughter Tina's number) and confirmed that family was agreeable to return to [facility].  Leron Croak, LCSWA [employer] [phone]

## 2012-01-30 NOTE — Consult Note (Signed)
Reason for Consult:   Fever of unknown origin Referring Physician:   Dr. Harvie Junior Jose Crawford is an 78 y.o. male.  HPI:   89 year olf male with acute cholecystitis in May of this year.  On 12/03/11, he had placement of a percutaneous cholecystostomy tube and responded well to that.  A mediastinal mass was noted and he underwent a left thoracotomy and the mass was resected and found to be a thymoma.  He was discharged to a nursing home and pulled the cholecystostomy tube out (which reportedly has happened before).  He then was brought to the ED because of a fever and was admitted for that.  Past Medical History  Diagnosis Date  . Thymoma     s/p resection  . Acute cholecystitis 11/2011    s/p perc drain 12/03/11  . Basal cell carcinoma     multiple areas  . Prostate cancer   . Stroke   . Arthritis   . Depression   . Dementia     Short term  . Head injury     Concussion- has had short term memory and it deterates .  Marland Kitchen Cholecystitis     S/P cholecytostomy tube  . Dementia   . Stroke     Past Surgical History  Procedure Date  . Prostatectomy   . Colon surgery 1994    Removal of parts of colon  . Thoracotomy 01/13/2012    Procedure: THORACOTOMY MAJOR;  Surgeon: Alleen Borne, MD;  [facility] OR;  Service: Thoracic;  Laterality: Left;  LEFT THORACOTOMY, RESECTION OF THYMOMA    Family History  Problem Relation Age of Onset  . Cancer Mother   . Aneurysm Mother     Social History:  reports that he quit smoking about 43 years ago. His smoking use included Cigarettes. He has never used smokeless tobacco. He reports that he does not drink alcohol or use illicit drugs.  Allergies:  Allergies  Allergen Reactions  . Other Other (See Comments)    All narcotics. Pt. Is hard to manage    Medications:  Scheduled:   . sodium chloride   Intravenous STAT  . nystatin cream  1 application Topical BID  . piperacillin-tazobactam (ZOSYN)  IV  3.375 g Intravenous Once  .  piperacillin-tazobactam (ZOSYN)  IV  3.375 g Intravenous Q8H  . sodium chloride  500 mL Intravenous Once  . vancomycin  1,000 mg Intravenous Once  . vancomycin  1,000 mg Intravenous Q12H  . DISCONTD: sodium chloride   Intravenous Once  . DISCONTD: enoxaparin (LOVENOX) injection  40 mg Subcutaneous Q24H    Results for orders placed during the hospital encounter of 01/29/12 (from the past 48 hour(s))  CBC WITH DIFFERENTIAL     Status: Abnormal   Collection Time   01/29/12  2:22 PM      Component Value Range Comment   WBC 28.8 (*) 4.0 - 10.5 K/uL    RBC 4.19 (*) 4.22 - 5.81 MIL/uL    Hemoglobin 12.9 (*) 13.0 - 17.0 g/dL    HCT 16.1 (*) 09.6 - 52.0 %    MCV 91.9  78.0 - 100.0 fL    MCH 30.8  26.0 - 34.0 pg    MCHC 33.5  30.0 - 36.0 g/dL    RDW 04.5  40.9 - 81.1 %    Platelets 403 (*) 150 - 400 K/uL    Neutrophils Relative 91 (*) 43 - 77 %    Neutro Abs  26.3 (*) 1.7 - 7.7 K/uL    Lymphocytes Relative 3 (*) 12 - 46 %    Lymphs Abs 0.9  0.7 - 4.0 K/uL    Monocytes Relative 6  3 - 12 %    Monocytes Absolute 1.6 (*) 0.1 - 1.0 K/uL    Eosinophils Relative 0  0 - 5 %    Eosinophils Absolute 0.0  0.0 - 0.7 K/uL    Basophils Relative 0  0 - 1 %    Basophils Absolute 0.0  0.0 - 0.1 K/uL   COMPREHENSIVE METABOLIC PANEL     Status: Abnormal   Collection Time   01/29/12  2:22 PM      Component Value Range Comment   Sodium 131 (*) 135 - 145 mEq/L    Potassium 3.8  3.5 - 5.1 mEq/L    Chloride 98  96 - 112 mEq/L    CO2 23  19 - 32 mEq/L    Glucose, Bld 131 (*) 70 - 99 mg/dL    BUN 16  6 - 23 mg/dL    Creatinine, Ser 4.54  0.50 - 1.35 mg/dL    Calcium 09.8 (*) 8.4 - 10.5 mg/dL    Total Protein 6.3  6.0 - 8.3 g/dL    Albumin 3.3 (*) 3.5 - 5.2 g/dL    AST 15  0 - 37 U/L    ALT 11  0 - 53 U/L    Alkaline Phosphatase 63  39 - 117 U/L    Total Bilirubin 1.1  0.3 - 1.2 mg/dL    GFR calc non Af Amer 76 (*) >90 mL/min    GFR calc Af Amer 89 (*) >90 mL/min   URINALYSIS, ROUTINE W REFLEX MICROSCOPIC      Status: Abnormal   Collection Time   01/29/12  2:44 PM      Component Value Range Comment   Color, Urine AMBER (*) YELLOW BIOCHEMICALS MAY BE AFFECTED BY COLOR   APPearance CLOUDY (*) CLEAR    Specific Gravity, Urine 1.026  1.005 - 1.030    pH 5.5  5.0 - 8.0    Glucose, UA NEGATIVE  NEGATIVE mg/dL    Hgb urine dipstick NEGATIVE  NEGATIVE    Bilirubin Urine SMALL (*) NEGATIVE    Ketones, ur TRACE (*) NEGATIVE mg/dL    Protein, ur NEGATIVE  NEGATIVE mg/dL    Urobilinogen, UA 1.0  0.0 - 1.0 mg/dL    Nitrite NEGATIVE  NEGATIVE    Leukocytes, UA NEGATIVE  NEGATIVE MICROSCOPIC NOT DONE ON URINES WITH NEGATIVE PROTEIN, BLOOD, LEUKOCYTES, NITRITE, OR GLUCOSE <1000 mg/dL.  LACTIC ACID, PLASMA     Status: Normal   Collection Time   01/29/12  3:26 PM      Component Value Range Comment   Lactic Acid, Venous 1.1  0.5 - 2.2 mmol/L   BASIC METABOLIC PANEL     Status: Abnormal   Collection Time   01/30/12  4:29 AM      Component Value Range Comment   Sodium 129 (*) 135 - 145 mEq/L    Potassium 3.7  3.5 - 5.1 mEq/L    Chloride 99  96 - 112 mEq/L    CO2 23  19 - 32 mEq/L    Glucose, Bld 135 (*) 70 - 99 mg/dL    BUN 16  6 - 23 mg/dL    Creatinine, Ser 1.19  0.50 - 1.35 mg/dL    Calcium 14.7  8.4 - 10.5 mg/dL  GFR calc non Af Amer 76 (*) >90 mL/min    GFR calc Af Amer 89 (*) >90 mL/min   CBC     Status: Abnormal   Collection Time   01/30/12  4:29 AM      Component Value Range Comment   WBC 22.0 (*) 4.0 - 10.5 K/uL    RBC 3.56 (*) 4.22 - 5.81 MIL/uL    Hemoglobin 10.9 (*) 13.0 - 17.0 g/dL    HCT 45.4 (*) 09.8 - 52.0 %    MCV 91.3  78.0 - 100.0 fL    MCH 30.6  26.0 - 34.0 pg    MCHC 33.5  30.0 - 36.0 g/dL    RDW 11.9  14.7 - 82.9 %    Platelets 349  150 - 400 K/uL     Dg Chest 2 View  01/29/2012  *RADIOLOGY REPORT*  Clinical Data: Chest pain.  Shortness of breath.  Recent thoracotomy for left-sided mediastinal mass.  CHEST - 2 VIEW  Comparison: Portable chest x-rays 01/17/2012 dating back  to 01/13/2012.  Two-view chest x-ray 01/12/2012.  Findings: Post-surgical changes along the left side the mediastinum post resection of the mediastinal mass.  Resolution of the left pneumothorax since the 01/17/2012 examination.  Small bilateral pleural effusions and associated mild passive atelectasis in the lower lobes.  Lungs otherwise clear.  Cardiac silhouette normal in size, unchanged.  Pulmonary vascularity normal.  Visualized bony thorax intact.  IMPRESSION: Resolution of the left pneumothorax since 01/17/2012.  Residual small bilateral pleural effusions and associated passive atelectasis in the lower lobes.  No acute cardiopulmonary disease otherwise.  Original Report Authenticated By: Arnell Sieving, M.D.   Ct Chest W Contrast  01/29/2012  *RADIOLOGY REPORT*  Clinical Data:  Right quadrant pain, recent thoracotomy for thymoma.  Evaluate for empyema. The patients has history of cholecystostomy tube which  the patient pulled out apparently.  CT CHEST AND ABDOMEN WITH CONTRAST  Technique:  Multidetector CT imaging of the chest and abdomen was performed following the standard protocol during bolus administration of intravenous contrast.  Contrast: OMNIPAQUE IOHEXOL 300 MG/ML  SOLN  Comparison:  CT scan 05/10/2013CT CHEST  Findings:  No axillary or supraclavicular lymphadenopathy.  There is postsurgical change along the right lateral chest wall with muscle edema related to recent thoracotomy.  No evidence of abscess.  There is interval resection of the large anterior mediastinal mass. There is mild bibasilar atelectasis.  No evidence of empyema or pulmonary abscess.  No pericardial fluid.  The ascending aorta is enlarged to the 46 x 47 mm unchanged from prior.  IMPRESSION:  1.  No evidence of empyema or pulmonary abscess following thymoma resection.  2.  Bibasilar atelectasis and small effusions. 3.  Stable ascending aortic aneurysm.  CT ABDOMEN  Findings:  There is thickening of the gallbladder  wall.  The gallbladder is decompressed following placement of cholecystostomy tube.  Cholecystectomy tube has been removed.  There is a fluid collection along the right lateral hepatic lobe measuring 6.0 x 1.8 cm (image 63).  This could represent early abscess.  No focal hepatic lesion.  The pancreas, spleen, adrenal glands, and kidneys are stable.  There are bilateral nephrolithiasis without evidence obstruction.  The stomach and limited view of the small bowel and colon are unremarkable.  Abdominal aorta is normal. Review of  bone windows demonstrates no aggressive osseous lesions.  IMPRESSION:  1.  New fluid collection along the right lateral hepatic lobe adjacent to gallbladder fossa but  could represent an early abscess. This is in the course of the prior cholecystostomy tube. 2.  The gallbladder is decompressed and thick-walled.  Original Report Authenticated By: Genevive Bi, M.D.   Ct Abdomen W Contrast  01/29/2012  *RADIOLOGY REPORT*  Clinical Data:  Right quadrant pain, recent thoracotomy for thymoma.  Evaluate for empyema. The patients has history of cholecystostomy tube which  the patient pulled out apparently.  CT CHEST AND ABDOMEN WITH CONTRAST  Technique:  Multidetector CT imaging of the chest and abdomen was performed following the standard protocol during bolus administration of intravenous contrast.  Contrast: OMNIPAQUE IOHEXOL 300 MG/ML  SOLN  Comparison:  CT scan 05/10/2013CT CHEST  Findings:  No axillary or supraclavicular lymphadenopathy.  There is postsurgical change along the right lateral chest wall with muscle edema related to recent thoracotomy.  No evidence of abscess.  There is interval resection of the large anterior mediastinal mass. There is mild bibasilar atelectasis.  No evidence of empyema or pulmonary abscess.  No pericardial fluid.  The ascending aorta is enlarged to the 46 x 47 mm unchanged from prior.  IMPRESSION:  1.  No evidence of empyema or pulmonary abscess  following thymoma resection.  2.  Bibasilar atelectasis and small effusions. 3.  Stable ascending aortic aneurysm.  CT ABDOMEN  Findings:  There is thickening of the gallbladder wall.  The gallbladder is decompressed following placement of cholecystostomy tube.  Cholecystectomy tube has been removed.  There is a fluid collection along the right lateral hepatic lobe measuring 6.0 x 1.8 cm (image 63).  This could represent early abscess.  No focal hepatic lesion.  The pancreas, spleen, adrenal glands, and kidneys are stable.  There are bilateral nephrolithiasis without evidence obstruction.  The stomach and limited view of the small bowel and colon are unremarkable.  Abdominal aorta is normal. Review of  bone windows demonstrates no aggressive osseous lesions.  IMPRESSION:  1.  New fluid collection along the right lateral hepatic lobe adjacent to gallbladder fossa but could represent an early abscess. This is in the course of the prior cholecystostomy tube. 2.  The gallbladder is decompressed and thick-walled.  Original Report Authenticated By: Genevive Bi, M.D.    Review of Systems  Unable to perform ROS: dementia   Blood pressure 103/64, pulse 81, temperature 98.3 F (36.8 C), temperature source Oral, resp. rate 16, height 6\' 1"  (1.854 m), weight 161 lb 2.5 oz (73.1 kg), SpO2 94.00%. Physical Exam  Constitutional:       Elderly male in NAD.  Eyes: No scleral icterus.  Respiratory:       Left sided incision is clean and intact.  Chest tube site sutures are in and those wounds are healed.  Right lower chest wall small wound with some surrounding erythema but no drainage (previous cholecystostomy site)  GI: Soft. He exhibits no mass. There is tenderness (RUQ laterally).  Skin: Skin is warm and dry.    Assessment/Plan: Fever of unknown origin-CT demonstrates a right lateral fluid collection suspicious for abscess.  Has a hx of acute cholecystitis treated with percutaneous cholecystostomy  tube.  Plan:  IR consult to drain perihepatic fluid collection.  Eventually, he will need a cholecystectomy and Dr. Andrey Campanile has discussed the timing of this with him.  After this hospitalization, he will need to follow up with Dr. Andrey Campanile to schedule the cholecystectomy.  Jose Crawford J 01/30/2012, 8:31 AM

## 2012-01-31 ENCOUNTER — Encounter (HOSPITAL_COMMUNITY): Payer: Self-pay | Admitting: Radiology

## 2012-01-31 ENCOUNTER — Inpatient Hospital Stay (HOSPITAL_COMMUNITY): Payer: Medicare Other

## 2012-01-31 LAB — PROTIME-INR: INR: 1.02 (ref 0.00–1.49)

## 2012-01-31 LAB — URINE CULTURE: Colony Count: 2000

## 2012-01-31 LAB — APTT: aPTT: 36 seconds (ref 24–37)

## 2012-01-31 MED ORDER — FENTANYL CITRATE 0.05 MG/ML IJ SOLN
50.0000 ug | Freq: Once | INTRAMUSCULAR | Status: AC
  Administered 2012-01-31: 50 ug via INTRAVENOUS
  Filled 2012-01-31: qty 2

## 2012-01-31 MED ORDER — MIDAZOLAM HCL 5 MG/5ML IJ SOLN
INTRAMUSCULAR | Status: AC | PRN
  Administered 2012-01-31: 0.5 mg via INTRAVENOUS

## 2012-01-31 MED ORDER — FENTANYL CITRATE 0.05 MG/ML IJ SOLN
INTRAMUSCULAR | Status: AC
  Filled 2012-01-31: qty 2

## 2012-01-31 MED ORDER — LIDOCAINE HCL 1 % IJ SOLN
INTRAMUSCULAR | Status: AC
  Filled 2012-01-31: qty 20

## 2012-01-31 MED ORDER — IOHEXOL 300 MG/ML  SOLN
10.0000 mL | Freq: Once | INTRAMUSCULAR | Status: AC | PRN
  Administered 2012-01-31: 10 mL

## 2012-01-31 MED ORDER — FENTANYL CITRATE 0.05 MG/ML IJ SOLN
INTRAMUSCULAR | Status: AC | PRN
  Administered 2012-01-31 (×2): 25 ug via INTRAVENOUS
  Administered 2012-01-31: 50 ug via INTRAVENOUS

## 2012-01-31 MED ORDER — MIDAZOLAM HCL 2 MG/2ML IJ SOLN
INTRAMUSCULAR | Status: AC
  Filled 2012-01-31: qty 2

## 2012-01-31 NOTE — Progress Notes (Addendum)
PT Cancellation Note  ___Treatment cancelled today due to medical issues with patient which prohibited   therapy  __x_ Treatment cancelled today due to patient receiving procedure or test   ___ Treatment cancelled today due to patient's refusal to participate   __ Treatment cancelled today due to    [facility]

## 2012-01-31 NOTE — Progress Notes (Addendum)
CSW received a phone call from [employer], informing the family thought they would return today. CSW just received the referral. CSW spoke with the Nurse and was informed that the patient was going to have a gallbladder surgery and would not be returning anytime soon. CSW left a message with the daughter [name] [phone] and brother brandon, [phone] to discuss future discharges and timing.  Kayleen Memos. Leighton Ruff [phone]

## 2012-01-31 NOTE — Progress Notes (Signed)
Subjective:  Confused, asking me to open bathroom door, wants to know if there is a bug on the wall in there.  Know he's in the hospital and year.  Objective: Vital signs in last 24 hours: Temp:  [97.7 F (36.5 C)-98.6 F (37 C)] 97.7 F (36.5 C) (07/08 0510) Pulse Rate:  [72-78] 78  (07/08 0510) Resp:  [16-20] 18  (07/08 0510) BP: (94-115)/(55-62) 115/62 mmHg (07/08 0510) SpO2:  [95 %] 95 % (07/08 0510) Last BM Date: 01/19/12 (pta)  600 PO yesterday, NPO now, for new drain, Tm 100.7 yesterday. WBC improving,on Zosyn and Vancomycin  Intake/Output from previous day: 07/07 0701 - 07/08 0700 In: 2677.5 [P.O.:600; I.V.:1227.5; IV Piggyback:850] Out: 1150 [Urine:1150] Intake/Output this shift:    General appearance: alert, cooperative and no distress Resp: clear to auscultation bilaterally and still has CT suture from thoracotomy 6/20 GI: soft, non-tender; bowel sounds normal; no masses,  no organomegaly and he says it hurts to breath and point to LLQ and also some discomfort in RUQ, No change noted actually on exam  Lab Results:   Basename 01/30/12 0429 01/29/12 1422  WBC 22.0* 28.8*  HGB 10.9* 12.9*  HCT 32.5* 38.5*  PLT 349 403*    BMET  Basename 01/30/12 0429 01/29/12 1422  NA 129* 131*  K 3.7 3.8  CL 99 98  CO2 23 23  GLUCOSE 135* 131*  BUN 16 16  CREATININE 0.89 0.89  CALCIUM 10.3 11.4*   PT/INR No results found for this basename: LABPROT:2,INR:2 in the last 72 hours   Lab 01/29/12 1422  AST 15  ALT 11  ALKPHOS 63  BILITOT 1.1  PROT 6.3  ALBUMIN 3.3*     Lipase     Component Value Date/Time   LIPASE 13 12/02/2011 1300     Studies/Results: Dg Chest 2 View  01/29/2012  *RADIOLOGY REPORT*  Clinical Data: Chest pain.  Shortness of breath.  Recent thoracotomy for left-sided mediastinal mass.  CHEST - 2 VIEW  Comparison: Portable chest x-rays 01/17/2012 dating back to 01/13/2012.  Two-view chest x-ray 01/12/2012.  Findings: Post-surgical changes  along the left side the mediastinum post resection of the mediastinal mass.  Resolution of the left pneumothorax since the 01/17/2012 examination.  Small bilateral pleural effusions and associated mild passive atelectasis in the lower lobes.  Lungs otherwise clear.  Cardiac silhouette normal in size, unchanged.  Pulmonary vascularity normal.  Visualized bony thorax intact.  IMPRESSION: Resolution of the left pneumothorax since 01/17/2012.  Residual small bilateral pleural effusions and associated passive atelectasis in the lower lobes.  No acute cardiopulmonary disease otherwise.  Original Report Authenticated By: Arnell Sieving, M.D.   Ct Chest W Contrast  01/29/2012  *RADIOLOGY REPORT*  Clinical Data:  Right quadrant pain, recent thoracotomy for thymoma.  Evaluate for empyema. The patients has history of cholecystostomy tube which  the patient pulled out apparently.  CT CHEST AND ABDOMEN WITH CONTRAST  Technique:  Multidetector CT imaging of the chest and abdomen was performed following the standard protocol during bolus administration of intravenous contrast.  Contrast: OMNIPAQUE IOHEXOL 300 MG/ML  SOLN  Comparison:  CT scan 05/10/2013CT CHEST  Findings:  No axillary or supraclavicular lymphadenopathy.  There is postsurgical change along the right lateral chest wall with muscle edema related to recent thoracotomy.  No evidence of abscess.  There is interval resection of the large anterior mediastinal mass. There is mild bibasilar atelectasis.  No evidence of empyema or pulmonary abscess.  No pericardial  fluid.  The ascending aorta is enlarged to the 46 x 47 mm unchanged from prior.  IMPRESSION:  1.  No evidence of empyema or pulmonary abscess following thymoma resection.  2.  Bibasilar atelectasis and small effusions. 3.  Stable ascending aortic aneurysm.  CT ABDOMEN  Findings:  There is thickening of the gallbladder wall.  The gallbladder is decompressed following placement of cholecystostomy tube.   Cholecystectomy tube has been removed.  There is a fluid collection along the right lateral hepatic lobe measuring 6.0 x 1.8 cm (image 63).  This could represent early abscess.  No focal hepatic lesion.  The pancreas, spleen, adrenal glands, and kidneys are stable.  There are bilateral nephrolithiasis without evidence obstruction.  The stomach and limited view of the small bowel and colon are unremarkable.  Abdominal aorta is normal. Review of  bone windows demonstrates no aggressive osseous lesions.  IMPRESSION:  1.  New fluid collection along the right lateral hepatic lobe adjacent to gallbladder fossa but could represent an early abscess. This is in the course of the prior cholecystostomy tube. 2.  The gallbladder is decompressed and thick-walled.  Original Report Authenticated By: Genevive Bi, M.D.   Ct Abdomen W Contrast  01/29/2012  *RADIOLOGY REPORT*  Clinical Data:  Right quadrant pain, recent thoracotomy for thymoma.  Evaluate for empyema. The patients has history of cholecystostomy tube which  the patient pulled out apparently.  CT CHEST AND ABDOMEN WITH CONTRAST  Technique:  Multidetector CT imaging of the chest and abdomen was performed following the standard protocol during bolus administration of intravenous contrast.  Contrast: OMNIPAQUE IOHEXOL 300 MG/ML  SOLN  Comparison:  CT scan 05/10/2013CT CHEST  Findings:  No axillary or supraclavicular lymphadenopathy.  There is postsurgical change along the right lateral chest wall with muscle edema related to recent thoracotomy.  No evidence of abscess.  There is interval resection of the large anterior mediastinal mass. There is mild bibasilar atelectasis.  No evidence of empyema or pulmonary abscess.  No pericardial fluid.  The ascending aorta is enlarged to the 46 x 47 mm unchanged from prior.  IMPRESSION:  1.  No evidence of empyema or pulmonary abscess following thymoma resection.  2.  Bibasilar atelectasis and small effusions. 3.  Stable  ascending aortic aneurysm.  CT ABDOMEN  Findings:  There is thickening of the gallbladder wall.  The gallbladder is decompressed following placement of cholecystostomy tube.  Cholecystectomy tube has been removed.  There is a fluid collection along the right lateral hepatic lobe measuring 6.0 x 1.8 cm (image 63).  This could represent early abscess.  No focal hepatic lesion.  The pancreas, spleen, adrenal glands, and kidneys are stable.  There are bilateral nephrolithiasis without evidence obstruction.  The stomach and limited view of the small bowel and colon are unremarkable.  Abdominal aorta is normal. Review of  bone windows demonstrates no aggressive osseous lesions.  IMPRESSION:  1.  New fluid collection along the right lateral hepatic lobe adjacent to gallbladder fossa but could represent an early abscess. This is in the course of the prior cholecystostomy tube. 2.  The gallbladder is decompressed and thick-walled.  Original Report Authenticated By: Genevive Bi, M.D.    Medications:    . sodium chloride   Intravenous STAT  . nystatin cream  1 application Topical BID  . piperacillin-tazobactam (ZOSYN)  IV  3.375 g Intravenous Q8H  . vancomycin  1,000 mg Intravenous Q12H    Assessment/Plan Fever secondary to recurrent cholecystitis, s/p  cholecystostomy which has come out again.  Awaiting IR replacement of percutaneous drain. Dementia LEFT THORACOTOMY, RESECTION OF THYMOMA;01/13/2012 Dr. Laneta Simmers Hyponatremia anemia  Plan:  Repeat drainage of GB by IR today.  Follow up for surgery with DR. Wilson for cholecystectomy.      LOS: 2 days    Reggie Welge 01/31/2012

## 2012-01-31 NOTE — Progress Notes (Signed)
UR done. 

## 2012-01-31 NOTE — Progress Notes (Signed)
PROGRESS NOTE  Jose Crawford NWG:[id number] DOB: 26-Mar-1926 DOA: 01/29/2012 PCP: Georgann Housekeeper, MD  Brief narrative: Jose Crawford is an 78 y.o. male with a PMH of thymoma status post thoracotomy on 01/13/12 and cholecystitis treated with a cholecystostomy tube as well as advanced dementia who was sent to the hospital from his SNF with fever that began after pulling out his cholecystostomy tube.   Interim History: CT scans of the chest and abdomen done last night, confirm fever source as likely stemming from cholecystitis, fluid collection. For percutaneous drainage of abscess per IR.  Assessment/Plan: Principal Problem:  *Fever secondary to recurrent cholecystitis  Admitted and a diagnostic evaluation confirmed recurrent cholecystitis as the probable source of fever.  Placed on empiric Vancomycin and Zosyn.  Discussed case with [name] on 01/29/12, who saw him 01/30/12 and with [name] of IR.  Plan is for percutaneous drainage of fluid collection per IR & F/U with Dr. Andrey Campanile as an outpatient for consideration of cholecystectomy. Active Problems:  Dementia   Advanced. Disoriented, anxious.   Return to SNF when stable. Thymoma   Status post resection/thoracotomy. No evidence of post operative wound infection.  F/U with [name] after discharge. Hyponatremia   Maybe SIADH from malignancy versus dehydration.   Hydrate and monitor.  Hypercalcemia likely secondary to dehydration  Calcium normalized with IVF.  Normocytic anemia  Slight drop in hemoglobin likely dilutional, with underlying AOCD contributory.  Code Status: DNR Family Communication: Spoke with son on phone 01/31/12. [name] [phone]) Disposition Plan: SNF when stable.  Medical Consultants:  [name], Surgery  [name], Interventional Radiology  Other consultants:  Physical therapy  Occupational therapy  Antibiotics:  Vancomycin 01/29/12--->  Zosyn 01/29/12--->   Subjective  Jose Crawford is pleasantly confused.  He remains without complaints.  Specifically, denies dyspnea, nausea, vomiting, pain.   Objective   Objective: Filed Vitals:   01/30/12 0510 01/30/12 1423 01/30/12 2007 01/31/12 0510  BP: 103/64 104/61 94/55 115/62  Pulse: 81 72 78 78  Temp: 98.3 F (36.8 C) 98.6 F (37 C) 98.3 F (36.8 C) 97.7 F (36.5 C)  TempSrc: Oral  Oral Oral  Resp: 16 20 16 18   Height:      Weight:      SpO2: 94% 95% 95% 95%    Intake/Output Summary (Last 24 hours) at 01/31/12 0915 Last data filed at 01/31/12 0620  Gross per 24 hour  Intake 2677.51 ml  Output   1150 ml  Net 1527.51 ml    Exam: Gen:  NAD, pleasantly confused Cardiovascular:  RRR, No M/R/G Respiratory: Lungs CTAB Gastrointestinal: Abdomen soft, tender to palpation, RUQ but less so than yesterday. Extremities: No C/E/C  Data Reviewed: Basic Metabolic Panel:  Lab 01/30/12 5784 01/29/12 1422  NA 129* 131*  K 3.7 3.8  CL 99 98  CO2 23 23  GLUCOSE 135* 131*  BUN 16 16  CREATININE 0.89 0.89  CALCIUM 10.3 11.4*  MG -- --  PHOS -- --   GFR Estimated Creatinine Clearance: 63.9 ml/min (by C-G formula based on Cr of 0.89). Liver Function Tests:  Lab 01/29/12 1422  AST 15  ALT 11  ALKPHOS 63  BILITOT 1.1  PROT 6.3  ALBUMIN 3.3*   CBC:  Lab 01/30/12 0429 01/29/12 1422  WBC 22.0* 28.8*  NEUTROABS -- 26.3*  HGB 10.9* 12.9*  HCT 32.5* 38.5*  MCV 91.3 91.9  PLT 349 403*   Microbiology Recent Results (from the past 240 hour(s))  CULTURE, BLOOD (  ROUTINE X 2)     Status: Normal (Preliminary result)   Collection Time   01/29/12  2:22 PM      Component Value Range Status Comment   Specimen Description BLOOD LEFT IV SITE   Final    Special Requests BOTTLES DRAWN AEROBIC AND ANAEROBIC 5 CC EACH   Final    Culture  Setup Time 01/29/2012 18:35   Final    Culture     Final    Value:        BLOOD CULTURE RECEIVED NO GROWTH TO DATE CULTURE WILL BE HELD FOR 5 DAYS BEFORE ISSUING A FINAL NEGATIVE  REPORT   Report Status PENDING   Incomplete   URINE CULTURE     Status: Normal   Collection Time   01/29/12  2:44 PM      Component Value Range Status Comment   Specimen Description URINE, CATHETERIZED   Final    Special Requests NONE   Final    Culture  Setup Time 01/29/2012 18:34   Final    Colony Count 2,000 COLONIES/ML   Final    Culture ESCHERICHIA COLI   Final    Report Status 01/31/2012 FINAL   Final    Organism ID, Bacteria ESCHERICHIA COLI   Final   CULTURE, BLOOD (ROUTINE X 2)     Status: Normal (Preliminary result)   Collection Time   01/29/12  3:26 PM      Component Value Range Status Comment   Specimen Description BLOOD RIGHT FOREARM   Final    Special Requests BOTTLES DRAWN AEROBIC ONLY 1.5 CC   Final    Culture  Setup Time 01/29/2012 18:35   Final    Culture     Final    Value:        BLOOD CULTURE RECEIVED NO GROWTH TO DATE CULTURE WILL BE HELD FOR 5 DAYS BEFORE ISSUING A FINAL NEGATIVE REPORT   Report Status PENDING   Incomplete   MRSA PCR SCREENING     Status: Normal   Collection Time   01/30/12 10:35 AM      Component Value Range Status Comment   MRSA by PCR NEGATIVE  NEGATIVE Final     Procedures and Diagnostic Studies:  Dg Chest 2 View 01/29/2012  IMPRESSION: Resolution of the left pneumothorax since 01/17/2012.  Residual small bilateral pleural effusions and associated passive atelectasis in the lower lobes.  No acute cardiopulmonary disease otherwise.  Original Report Authenticated By: Arnell Sieving, M.D.    Ct Chest W Contrast 01/29/2012   IMPRESSION:  1.  No evidence of empyema or pulmonary abscess following thymoma resection.  2.  Bibasilar atelectasis and small effusions. 3.  Stable ascending aortic aneurysm. Original Report Authenticated By: Genevive Bi, M.D.    Ct Abdomen W Contrast 01/29/2012  IMPRESSION:  1.  New fluid collection along the right lateral hepatic lobe adjacent to gallbladder fossa but could represent an early abscess. This is in the  course of the prior cholecystostomy tube. 2.  The gallbladder is decompressed and thick-walled.  Original Report Authenticated By: Genevive Bi, M.D.   Scheduled Meds:    . sodium chloride   Intravenous STAT  . nystatin cream  1 application Topical BID  . piperacillin-tazobactam (ZOSYN)  IV  3.375 g Intravenous Q8H  . vancomycin  1,000 mg Intravenous Q12H   Continuous Infusions:    . sodium chloride 50 mL/hr at 01/31/12 0451      LOS: 2 days  Hillery Aldo, MD Pager [phone]  01/31/2012, 9:15 AM

## 2012-01-31 NOTE — Progress Notes (Signed)
Pt received tylenol at 2035 is still moaning in pain.  Notified MD.  Received new orders.  Will continue to monitor pt. Daphene Calamity McDonough 9:21 PM 01/31/2012

## 2012-01-31 NOTE — Procedures (Signed)
INTERVENTIONAL RADIOLOGY PROCEDURE NOTE  US guided placement of a 10.2 F Dawson Mueller drainage catheter into the perihepatic fluid collection.  20 mL of purulent bilious fluid was aspirated and sent for culture.   Drain connected to gravity bag.   No immediate complication.

## 2012-01-31 NOTE — Interval H&P Note (Cosign Needed)
History and Physical Interval Note:  01/31/2012 9:00 AM  Jose Crawford  has been admitted for diagnosis of recurrent cholecystitis. He had perc chole drain placed 5/10 and subsequently exchanged on 6/25. However it has now fallen out and he has developed recurrent cholecystitis with perihepatic fluid collection/abscess. Surgery has seen the pt and requested perc drain be re-placed and acute process allowed to resolve prior to them scheduling elective surgery. The various methods of treatment have been discussed with the patient and family. After consideration of risks, benefits and other options for treatment, the patient has consented to percutaneous cholecystostomy tube placement with possible drainage of perihepatic abscess as intervention .  The patient's history has been reviewed, patient examined, no change in status, stable for surgery.  I have reviewed the patients' chart and labs.  Questions were answered to the patient's satisfaction.   BP 115/62  Pulse 78  Temp 97.7 F (36.5 C) (Oral)  Resp 18  Ht 6\' 1"  (1.854 m)  Wt 161 lb 2.5 oz (73.1 kg)  BMI 21.26 kg/m2  SpO2 95% Lungs: CTA Heart: Reg' Abdomen: soft, mildly tender RUQ, old perc drain site clean, scabbed over.  CBC    Component Value Date/Time   WBC 22.0* 01/30/2012 0429   WBC 9.3 12/21/2011 0910   RBC 3.56* 01/30/2012 0429   RBC 4.33 12/21/2011 0910   HGB 10.9* 01/30/2012 0429   HGB 13.3 12/21/2011 0910   HCT 32.5* 01/30/2012 0429   HCT 39.8 12/21/2011 0910   PLT 349 01/30/2012 0429   PLT 429* 12/21/2011 0910   MCV 91.3 01/30/2012 0429   MCV 91.9 12/21/2011 0910   MCH 30.6 01/30/2012 0429   MCH 30.6 12/21/2011 0910   MCHC 33.5 01/30/2012 0429   MCHC 33.3 12/21/2011 0910   RDW 14.3 01/30/2012 0429   RDW 13.5 12/21/2011 0910   LYMPHSABS 0.9 01/29/2012 1422   LYMPHSABS 1.8 12/21/2011 0910   MONOABS 1.6* 01/29/2012 1422   MONOABS 0.5 12/21/2011 0910   EOSABS 0.0 01/29/2012 1422   EOSABS 0.4 12/21/2011 0910   BASOSABS 0.0 01/29/2012 1422   BASOSABS 0.1 12/21/2011 0910    BMET    Component Value Date/Time   NA 129* 01/30/2012 0429   K 3.7 01/30/2012 0429   CL 99 01/30/2012 0429   CO2 23 01/30/2012 0429   GLUCOSE 135* 01/30/2012 0429   BUN 16 01/30/2012 0429   CREATININE 0.89 01/30/2012 0429   CALCIUM 10.3 01/30/2012 0429   GFRNONAA 76* 01/30/2012 0429   GFRAA 89* 01/30/2012 0429   Checking coags.  Seen for Dr. Isa Rankin, [facility] PA-C

## 2012-01-31 NOTE — Progress Notes (Signed)
Plan drainage of gallbladder/fluid collections to minimize inflammation/infection before cholecystectomy can commence. If he has an abscess, bad idea to do cholecystectomy at this time but allow that to resolve 1st.  Ideally wait >6weeks after initial drain placement (which he is past) & several weeks after drainage of any abscesses.

## 2012-01-31 NOTE — Progress Notes (Signed)
OT Cancellation Note  Treatment cancelled today due to patient receiving procedure or test. Will check back as schedule permits.  Anjel Pardo A OTR/L [phone] 01/31/2012, 12:07 PM

## 2012-02-01 ENCOUNTER — Encounter (HOSPITAL_COMMUNITY): Payer: Self-pay | Admitting: Internal Medicine

## 2012-02-01 ENCOUNTER — Encounter (HOSPITAL_COMMUNITY): Payer: Self-pay | Admitting: Anesthesiology

## 2012-02-01 ENCOUNTER — Inpatient Hospital Stay (HOSPITAL_COMMUNITY): Payer: Medicare Other | Admitting: Anesthesiology

## 2012-02-01 ENCOUNTER — Inpatient Hospital Stay (HOSPITAL_COMMUNITY): Payer: Medicare Other

## 2012-02-01 ENCOUNTER — Encounter (HOSPITAL_COMMUNITY)
Admission: EM | Disposition: A | Discharge status: Skilled Nursing Facility | Payer: Self-pay | Source: Home / Self Care | Attending: Internal Medicine

## 2012-02-01 DIAGNOSIS — K660 Peritoneal adhesions (postprocedural) (postinfection): Secondary | ICD-10-CM

## 2012-02-01 DIAGNOSIS — K5900 Constipation, unspecified: Secondary | ICD-10-CM | POA: Diagnosis present

## 2012-02-01 DIAGNOSIS — IMO0002 Reserved for concepts with insufficient information to code with codable children: Secondary | ICD-10-CM | POA: Diagnosis present

## 2012-02-01 HISTORY — PX: CHOLECYSTECTOMY: SHX55

## 2012-02-01 LAB — CBC
HCT: 31.5 % — ABNORMAL LOW (ref 39.0–52.0)
MCHC: 32.4 g/dL (ref 30.0–36.0)
RDW: 14.3 % (ref 11.5–15.5)

## 2012-02-01 LAB — BASIC METABOLIC PANEL
BUN: 13 mg/dL (ref 6–23)
Calcium: 10.8 mg/dL — ABNORMAL HIGH (ref 8.4–10.5)
Chloride: 100 mEq/L (ref 96–112)
Creatinine, Ser: 0.94 mg/dL (ref 0.50–1.35)
GFR calc Af Amer: 87 mL/min — ABNORMAL LOW (ref >90)
GFR calc non Af Amer: 75 mL/min — ABNORMAL LOW (ref >90)

## 2012-02-01 LAB — SURGICAL PCR SCREEN
MRSA, PCR: NEGATIVE
Staphylococcus aureus: POSITIVE — AB

## 2012-02-01 SURGERY — LAPAROSCOPIC CHOLECYSTECTOMY WITH INTRAOPERATIVE CHOLANGIOGRAM
Anesthesia: General | Site: Abdomen | Wound class: Dirty or Infected

## 2012-02-01 MED ORDER — HYDROMORPHONE HCL PF 1 MG/ML IJ SOLN
INTRAMUSCULAR | Status: AC
  Filled 2012-02-01: qty 1

## 2012-02-01 MED ORDER — BISACODYL 10 MG RE SUPP
10.0000 mg | Freq: Every day | RECTAL | Status: DC | PRN
  Filled 2012-02-01: qty 1

## 2012-02-01 MED ORDER — ZOLPIDEM TARTRATE 5 MG PO TABS
5.0000 mg | ORAL_TABLET | Freq: Every evening | ORAL | Status: DC | PRN
  Filled 2012-02-01: qty 1

## 2012-02-01 MED ORDER — ETOMIDATE 2 MG/ML IV SOLN
INTRAVENOUS | Status: DC | PRN
  Administered 2012-02-01: 16 mg via INTRAVENOUS

## 2012-02-01 MED ORDER — LIDOCAINE HCL 2 % EX GEL
Freq: Once | CUTANEOUS | Status: AC
  Administered 2012-02-01: 10 via URETHRAL
  Filled 2012-02-01: qty 20

## 2012-02-01 MED ORDER — LIP MEDEX EX OINT
1.0000 "application " | TOPICAL_OINTMENT | Freq: Two times a day (BID) | CUTANEOUS | Status: DC
  Administered 2012-02-01 – 2012-02-04 (×5): 1 via TOPICAL
  Filled 2012-02-01: qty 7

## 2012-02-01 MED ORDER — ONDANSETRON HCL 4 MG/2ML IJ SOLN
INTRAMUSCULAR | Status: DC | PRN
  Administered 2012-02-01: 4 mg via INTRAVENOUS

## 2012-02-01 MED ORDER — GLYCOPYRROLATE 0.2 MG/ML IJ SOLN
INTRAMUSCULAR | Status: DC | PRN
  Administered 2012-02-01: 0.6 mg via INTRAVENOUS

## 2012-02-01 MED ORDER — HYDROMORPHONE HCL PF 1 MG/ML IJ SOLN
0.2500 mg | INTRAMUSCULAR | Status: DC | PRN
  Administered 2012-02-01 (×3): 0.5 mg via INTRAVENOUS

## 2012-02-01 MED ORDER — SUCCINYLCHOLINE CHLORIDE 20 MG/ML IJ SOLN
INTRAMUSCULAR | Status: DC | PRN
  Administered 2012-02-01: 100 mg via INTRAVENOUS

## 2012-02-01 MED ORDER — LACTATED RINGERS IR SOLN
Status: DC | PRN
  Administered 2012-02-01: 2000 mL

## 2012-02-01 MED ORDER — BUPIVACAINE-EPINEPHRINE 0.25% -1:200000 IJ SOLN
INTRAMUSCULAR | Status: DC | PRN
  Administered 2012-02-01: 50 mL

## 2012-02-01 MED ORDER — LACTATED RINGERS IV SOLN
INTRAVENOUS | Status: DC | PRN
  Administered 2012-02-01 (×3): via INTRAVENOUS

## 2012-02-01 MED ORDER — PHENOL 1.4 % MT LIQD: 2.0000 | OROMUCOSAL | Status: DC | PRN

## 2012-02-01 MED ORDER — HYDROMORPHONE HCL PF 1 MG/ML IJ SOLN
1.0000 mg | INTRAMUSCULAR | Status: DC | PRN
  Administered 2012-02-01 – 2012-02-02 (×3): 1 mg via INTRAVENOUS
  Filled 2012-02-01 (×4): qty 1

## 2012-02-01 MED ORDER — SODIUM CHLORIDE 0.9 % IV SOLN
3.0000 g | Freq: Four times a day (QID) | INTRAVENOUS | Status: DC
  Administered 2012-02-01 – 2012-02-04 (×13): 3 g via INTRAVENOUS
  Filled 2012-02-01 (×15): qty 3

## 2012-02-01 MED ORDER — MUPIROCIN 2 % EX OINT
TOPICAL_OINTMENT | CUTANEOUS | Status: AC
  Administered 2012-02-01: 12:00:00
  Filled 2012-02-01: qty 22

## 2012-02-01 MED ORDER — SODIUM CHLORIDE 0.9 % IR SOLN
Status: DC | PRN
  Administered 2012-02-01: 16:00:00

## 2012-02-01 MED ORDER — BISACODYL 10 MG RE SUPP
10.0000 mg | Freq: Once | RECTAL | Status: AC
  Administered 2012-02-01: 10 mg via RECTAL

## 2012-02-01 MED ORDER — POLYETHYLENE GLYCOL 3350 17 G PO PACK
17.0000 g | PACK | Freq: Every day | ORAL | Status: DC
  Administered 2012-02-02 – 2012-02-04 (×3): 17 g via ORAL
  Filled 2012-02-01 (×4): qty 1

## 2012-02-01 MED ORDER — LACTATED RINGERS IV SOLN: INTRAVENOUS | Status: DC

## 2012-02-01 MED ORDER — CINACALCET HCL 30 MG PO TABS
30.0000 mg | ORAL_TABLET | Freq: Two times a day (BID) | ORAL | Status: DC
  Administered 2012-02-02 – 2012-02-04 (×5): 30 mg via ORAL
  Filled 2012-02-01 (×8): qty 1

## 2012-02-01 MED ORDER — MAGIC MOUTHWASH
15.0000 mL | Freq: Four times a day (QID) | ORAL | Status: DC | PRN
  Filled 2012-02-01: qty 15

## 2012-02-01 MED ORDER — FENTANYL CITRATE 0.05 MG/ML IJ SOLN
INTRAMUSCULAR | Status: DC | PRN
  Administered 2012-02-01 (×4): 25 ug via INTRAVENOUS

## 2012-02-01 MED ORDER — NONFORMULARY OR COMPOUNDED ITEM
1.0000 | Status: AC
  Filled 2012-02-01: qty 1

## 2012-02-01 MED ORDER — SODIUM CHLORIDE 0.9 % IR SOLN
Status: DC
  Filled 2012-02-01 (×3): qty 8

## 2012-02-01 MED ORDER — IOHEXOL 300 MG/ML  SOLN
INTRAMUSCULAR | Status: DC | PRN
  Administered 2012-02-01: 9 mL

## 2012-02-01 MED ORDER — SACCHAROMYCES BOULARDII 250 MG PO CAPS
250.0000 mg | ORAL_CAPSULE | Freq: Two times a day (BID) | ORAL | Status: DC
  Administered 2012-02-02 – 2012-02-04 (×5): 250 mg via ORAL
  Filled 2012-02-01 (×7): qty 1

## 2012-02-01 MED ORDER — MENTHOL 3 MG MT LOZG: 1.0000 | LOZENGE | OROMUCOSAL | Status: DC | PRN

## 2012-02-01 MED ORDER — CISATRACURIUM BESYLATE (PF) 10 MG/5ML IV SOLN
INTRAVENOUS | Status: DC | PRN
  Administered 2012-02-01: 2 mg via INTRAVENOUS
  Administered 2012-02-01: 4 mg via INTRAVENOUS

## 2012-02-01 MED ORDER — ALUM & MAG HYDROXIDE-SIMETH 200-200-20 MG/5ML PO SUSP
30.0000 mL | Freq: Four times a day (QID) | ORAL | Status: DC | PRN
  Filled 2012-02-01: qty 30

## 2012-02-01 MED ORDER — NEOSTIGMINE METHYLSULFATE 1 MG/ML IJ SOLN
INTRAMUSCULAR | Status: DC | PRN
  Administered 2012-02-01: 4 mg via INTRAVENOUS

## 2012-02-01 SURGICAL SUPPLY — 48 items
APPLIER CLIP 5 13 M/L LIGAMAX5 (MISCELLANEOUS)
APPLIER CLIP ROT 10 11.4 M/L (STAPLE)
CABLE HIGH FREQUENCY MONO STRZ (ELECTRODE) ×2 IMPLANT
CANISTER SUCTION 2500CC (MISCELLANEOUS) ×2 IMPLANT
CLIP APPLIE 5 13 M/L LIGAMAX5 (MISCELLANEOUS) IMPLANT
CLIP APPLIE ROT 10 11.4 M/L (STAPLE) IMPLANT
CLOTH BEACON ORANGE TIMEOUT ST (SAFETY) ×2 IMPLANT
COVER MAYO STAND STRL (DRAPES) ×2 IMPLANT
DECANTER SPIKE VIAL GLASS SM (MISCELLANEOUS) ×2 IMPLANT
DRAIN CHANNEL 15F RND FF 3/16 (WOUND CARE) ×2 IMPLANT
DRAPE C-ARM 42X72 X-RAY (DRAPES) ×2 IMPLANT
DRAPE LAPAROSCOPIC ABDOMINAL (DRAPES) ×2 IMPLANT
DRAPE WARM FLUID 44X44 (DRAPE) ×2 IMPLANT
DRSG TEGADERM 2-3/8X2-3/4 SM (GAUZE/BANDAGES/DRESSINGS) ×2 IMPLANT
DRSG TEGADERM 4X4.75 (GAUZE/BANDAGES/DRESSINGS) IMPLANT
ELECT REM PT RETURN 9FT ADLT (ELECTROSURGICAL) ×2
ELECTRODE REM PT RTRN 9FT ADLT (ELECTROSURGICAL) ×1 IMPLANT
ENDOLOOP SUT PDS II  0 18 (SUTURE) ×1
ENDOLOOP SUT PDS II 0 18 (SUTURE) ×1 IMPLANT
EVACUATOR DRAINAGE 7X20 100CC (MISCELLANEOUS) ×1 IMPLANT
EVACUATOR SILICONE 100CC (MISCELLANEOUS) ×1
GAUZE SPONGE 2X2 8PLY STRL LF (GAUZE/BANDAGES/DRESSINGS) ×1 IMPLANT
GLOVE ECLIPSE 8.0 STRL XLNG CF (GLOVE) ×2 IMPLANT
GLOVE INDICATOR 8.0 STRL GRN (GLOVE) ×2 IMPLANT
GOWN STRL NON-REIN LRG LVL3 (GOWN DISPOSABLE) ×6 IMPLANT
GOWN STRL REIN XL XLG (GOWN DISPOSABLE) ×4 IMPLANT
KIT BASIN OR (CUSTOM PROCEDURE TRAY) ×2 IMPLANT
NS IRRIG 1000ML POUR BTL (IV SOLUTION) ×2 IMPLANT
POUCH SPECIMEN RETRIEVAL 10MM (ENDOMECHANICALS) ×2 IMPLANT
SCALPEL HARMONIC ACE (MISCELLANEOUS) IMPLANT
SCISSORS LAP 5X35 DISP (ENDOMECHANICALS) ×2 IMPLANT
SET CHOLANGIOGRAPH MIX (MISCELLANEOUS) ×2 IMPLANT
SET IRRIG TUBING LAPAROSCOPIC (IRRIGATION / IRRIGATOR) ×2 IMPLANT
SLEEVE Z-THREAD 5X100MM (TROCAR) IMPLANT
SPONGE GAUZE 2X2 STER 10/PKG (GAUZE/BANDAGES/DRESSINGS) ×1
SUT ETHILON 3 0 PS 1 (SUTURE) ×2 IMPLANT
SUT MNCRL AB 4-0 PS2 18 (SUTURE) ×2 IMPLANT
SUT SILK 0 FSL (SUTURE) ×2 IMPLANT
SUT VICRYL 0 ENDOLOOP (SUTURE) IMPLANT
SUT VICRYL 0 TIES 12 18 (SUTURE) IMPLANT
SYR 20CC LL (SYRINGE) ×2 IMPLANT
TOWEL OR 17X26 10 PK STRL BLUE (TOWEL DISPOSABLE) ×2 IMPLANT
TRAY LAP CHOLE (CUSTOM PROCEDURE TRAY) ×2 IMPLANT
TROCAR BLADELESS OPT 5 75 (ENDOMECHANICALS) ×4 IMPLANT
TROCAR CANNULA W/PORT DUAL 5MM (MISCELLANEOUS) IMPLANT
TROCAR Z-THREAD FIOS 11X100 BL (TROCAR) ×2 IMPLANT
TROCAR Z-THREAD FIOS 5X100MM (TROCAR) ×2 IMPLANT
TUBING INSUFFLATION 10FT LAP (TUBING) ×2 IMPLANT

## 2012-02-01 NOTE — Anesthesia Preprocedure Evaluation (Addendum)
Anesthesia Evaluation  Patient identified by MRN, date of birth, ID band Patient awake    Reviewed: Allergy & Precautions, H&P , NPO status , Patient's Chart, lab work & pertinent test results  Airway Mallampati: II TM Distance: >3 FB Neck ROM: full    Dental  (+) Poor Dentition, Chipped and Dental Advisory Given,    Pulmonary neg pulmonary ROS,  Lung mass breath sounds clear to auscultation  Pulmonary exam normal       Cardiovascular Exercise Tolerance: Good negative cardio ROS  Rhythm:regular Rate:Normal     Neuro/Psych Depression dementia CVA, Residual Symptoms negative psych ROS   GI/Hepatic negative GI ROS, Neg liver ROS,   Endo/Other  negative endocrine ROS  Renal/GU negative Renal ROS  negative genitourinary   Musculoskeletal   Abdominal   Peds  Hematology negative hematology ROS (+) Blood dyscrasia, anemia , Hgb. 10.2   Anesthesia Other Findings   Reproductive/Obstetrics negative OB ROS                          Anesthesia Physical Anesthesia Plan  ASA: III  Anesthesia Plan: General   Post-op Pain Management:    Induction: Intravenous  Airway Management Planned: Oral ETT  Additional Equipment:   Intra-op Plan:   Post-operative Plan: Extubation in OR  Informed Consent: I have reviewed the patients History and Physical, chart, labs and discussed the procedure including the risks, benefits and alternatives for the proposed anesthesia with the patient or authorized representative who has indicated his/her understanding and acceptance.   Dental Advisory Given  Plan Discussed with: CRNA and Surgeon  Anesthesia Plan Comments:         Anesthesia Quick Evaluation

## 2012-02-01 NOTE — Transfer of Care (Signed)
Immediate Anesthesia Transfer of Care Note  Patient: Jose Crawford  Procedure(s) Performed: Procedure(s) (LRB): LAPAROSCOPIC CHOLECYSTECTOMY WITH INTRAOPERATIVE CHOLANGIOGRAM (N/A)  Patient Location: PACU  Anesthesia Type: General  Level of Consciousness: awake and sedated  Airway & Oxygen Therapy: Patient Spontanous Breathing and Patient connected to face mask oxygen  Post-op Assessment: Report given to PACU RN and Post -op Vital signs reviewed and stable  Post vital signs: Reviewed and stable  Complications: No apparent anesthesia complications

## 2012-02-01 NOTE — Progress Notes (Signed)
Pt in from PACU,s/p Laparoscopic cholecystectomy with Intra-op cholangiogram, still moaning from pain, vital signs taken and monitored, noted a new IV access,20g at left hand SL, JP drain to RUQ. Continued to watch for post op complications.

## 2012-02-01 NOTE — Progress Notes (Signed)
Pt woke up c/o pain, tried to urinate and only let out 25mL.  Bladder scan showed .  Notified MD.  Received new orders.  Tried in and out cath and couldn't get catheter in.  Notified MD.  Received new orders.  Inserted foley cath with success.  Will continue to monitor pt. Jose Crawford 3:36 AM 02/01/2012

## 2012-02-01 NOTE — Anesthesia Postprocedure Evaluation (Signed)
  Anesthesia Post-op Note  Patient: Jose Crawford  Procedure(s) Performed: Procedure(s) (LRB): LAPAROSCOPIC CHOLECYSTECTOMY WITH INTRAOPERATIVE CHOLANGIOGRAM (N/A)  Patient Location: PACU  Anesthesia Type: General  Level of Consciousness: awake and alert   Airway and Oxygen Therapy: Patient Spontanous Breathing  Post-op Pain: mild  Post-op Assessment: Post-op Vital signs reviewed, Patient's Cardiovascular Status Stable, Respiratory Function Stable, Patent Airway and No signs of Nausea or vomiting  Post-op Vital Signs: stable  Complications: No apparent anesthesia complications

## 2012-02-01 NOTE — Progress Notes (Addendum)
Jose Crawford [id number] 1925-12-18  CARE TEAM:  PCP: Georgann Housekeeper, MD  Outpatient Care Team: Patient Care Team: Georgann Housekeeper, MD as PCP - General (Internal Medicine)  Inpatient Treatment Team: Treatment Team: Attending Provider: Maryruth Bun Rama, MD; Technician: Barbette Or, NT; Rounding Team: Alton Revere, MD; Registered Nurse: Virgina Organ, RN; Consulting Physician: Bishop Limbo, MD; Registered Nurse: Adriana Simas, RN; Technician: Isaias Cowman, NT; Technician: Gwenlyn Perking, [place]; Occupational Therapist: Marica Otter, OT; Physical Therapist: Rada Hay, PT  Subjective:  Pt calm but confused: "What's the plan?"  Objective:  Vital signs:  Filed Vitals:   01/31/12 1502 01/31/12 1550 01/31/12 2146 02/01/12 0626  BP: 109/68 114/65 108/67 132/72  Pulse: 67 75 75 65  Temp: 98 F (36.7 C) 97.9 F (36.6 C) 97.7 F (36.5 C) 97.5 F (36.4 C)  TempSrc: Oral Oral Oral Oral  Resp: 16 16 18 16   Height:      Weight:      SpO2: 97% 95% 93% 98%    Last BM Date: 01/19/12 (pta)  Intake/Output   Yesterday:  07/08 0701 - 07/09 0700 In: 4223 [P.O.:120; I.V.:3415; IV Piggyback:478] Out: 1445 [Urine:1400; Drains:45] This shift:     Bowel function:  Flatus: ?   BM: No  Physical Exam:  General: Pt awake/alert/oriented x1 in no acute distress Eyes: PERRL, normal EOM.  Sclera clear.  No icterus Neuro: CN II-XII intact w/o focal sensory/motor deficits. Lymph: No head/neck/groin lymphadenopathy Psych:  No delerium/psychosis/paranoia.  Dementia stable.  Not agitated HENT: Normocephalic, Mucus membranes moist.  No thrush Neck: Supple, No tracheal deviation Chest: No chest wall pain w good excursion CV:  Pulses intact.  Regular rhythm Abdomen: Soft.  Nondistended.  Mildly tender at RUQ only.  No incarcerated hernias. Ext:  SCDs BLE.  No mjr edema.  No cyanosis Skin: No petechiae / purpurae  Problem List:  Principal Problem:  *Abscess, RUQ perihepatic Active Problems:  Dementia  Thymoma  Fever secondary to recurrent cholecystitis  Hyponatremia  Hypercalcemia likely secondary to dehydration  Normocytic anemia   Assessment  Jose Crawford  78 y.o. male       Perihepatic abscess s/p perc drain, improving  Plan:  At some point will need a chole.  Since abscess away from gallbladder & pt keeps removing tubes, may go ahead this admit, but will d/w IRad & Dr. Andrey Campanile (initial surgeon).  D/w Dr. Darnelle Catalan as well  -VTE prophylaxis- SCDs, etc -mobilize as tolerated to help recovery  Addendum:  D/w [facility].  Dr Deanne Coffer notes small abscess cavity away from GB well decompressed now.  D/w Dr. Andrey Campanile.  He is OK w lap chole now since >6wk from initial perc drainage, abscess small & away from GB & pt tolerated thymoma excision well  D/w pt's son, [name], whom agrees w OR but wishes to have sister d/w me as well.  The anatomy & physiology of hepatobiliary & pancreatic function was discussed.  The pathophysiology of gallbladder dysfunction was discussed.  Natural history risks without surgery was discussed.   I feel the risks of no intervention will lead to serious problems that outweigh the operative risks; therefore, I recommended cholecystectomy to remove the pathology.  I explained laparoscopic techniques with possible need for an open approach.  Probable cholangiogram to evaluate the bilary tract was explained as well.    Risks such as bleeding, infection, abscess, leak, injury to other organs, need for further treatment, heart attack, death, and other risks were  discussed.  I noted a good likelihood this will help address the problem.  Possibility that this will not correct all abdominal symptoms was explained.  Goals of post-operative recovery were discussed as well.  We will work to minimize complications.  An educational handout further explaining the pathology and treatment options was given as well.  Questions were  answered.  The patient expresses understanding & wishes to proceed with surgery.   Will tenetively plan OR later today if daughter agrees.  D/w Dr. Darnelle Catalan & pt & RN as well  Addendum:  I discussed the patient's status to the pt's daughter.  Questions were answered.  She expressed understanding & appreciation.  She wishes to proceed w surgery   Ardeth Sportsman, M.D., F.A.C.S. Gastrointestinal and Minimally Invasive Surgery [facility] [address], [address] [place], [place] [address] [phone] Main / Paging [phone] Voice Mail   02/01/2012  Results:   Labs: Results for orders placed during the hospital encounter of 01/29/12 (from the past 48 hour(s))  MRSA PCR SCREENING     Status: Normal   Collection Time   01/30/12 10:35 AM      Component Value Range Comment   MRSA by PCR NEGATIVE  NEGATIVE   APTT     Status: Normal   Collection Time   01/31/12  9:10 AM      Component Value Range Comment   aPTT 36  24 - 37 seconds   PROTIME-INR     Status: Normal   Collection Time   01/31/12  9:10 AM      Component Value Range Comment   Prothrombin Time 13.6  11.6 - 15.2 seconds    INR 1.02  0.00 - 1.49   CULTURE, ROUTINE-ABSCESS     Status: Normal (Preliminary result)   Collection Time   01/31/12  1:24 PM      Component Value Range Comment   Specimen Description PERITONEAL CAVITY      Special Requests Normal      Gram Stain        Value: MODERATE WBC PRESENT,BOTH PMN AND MONONUCLEAR     NO SQUAMOUS EPITHELIAL CELLS SEEN     RARE GRAM NEGATIVE RODS   Culture PENDING      Report Status PENDING     ANAEROBIC CULTURE     Status: Normal (Preliminary result)   Collection Time   01/31/12  1:24 PM      Component Value Range Comment   Specimen Description PERITONEAL CAVITY      Special Requests Normal      Gram Stain        Value: ABUNDANT WBC PRESENT, PREDOMINANTLY PMN     NO SQUAMOUS EPITHELIAL CELLS SEEN     RARE GRAM NEGATIVE RODS   Culture PENDING       Report Status PENDING     BASIC METABOLIC PANEL     Status: Abnormal   Collection Time   02/01/12  3:38 AM      Component Value Range Comment   Sodium 132 (*) 135 - 145 mEq/L    Potassium 3.5  3.5 - 5.1 mEq/L    Chloride 100  96 - 112 mEq/L    CO2 25  19 - 32 mEq/L    Glucose, Bld 100 (*) 70 - 99 mg/dL    BUN 13  6 - 23 mg/dL    Creatinine, Ser 4.69  0.50 - 1.35 mg/dL    Calcium 62.9 (*)  8.4 - 10.5 mg/dL    GFR calc non Af Amer 75 (*) >90 mL/min    GFR calc Af Amer 87 (*) >90 mL/min   CBC     Status: Abnormal   Collection Time   02/01/12  3:38 AM      Component Value Range Comment   WBC 7.8  4.0 - 10.5 K/uL    RBC 3.40 (*) 4.22 - 5.81 MIL/uL    Hemoglobin 10.2 (*) 13.0 - 17.0 g/dL    HCT 11.9 (*) 14.7 - 52.0 %    MCV 92.6  78.0 - 100.0 fL    MCH 30.0  26.0 - 34.0 pg    MCHC 32.4  30.0 - 36.0 g/dL    RDW 82.9  56.2 - 13.0 %    Platelets 341  150 - 400 K/uL     Imaging / Studies: Ir Guided Drain W Catheter Placement  01/31/2012  *RADIOLOGY REPORT*  Clinical Data: Cholecystitis, status post cholecystostomy tube placement.  The tube was inadvertently withdrawn.  Pain.  Recent CT demonstrates perihepatic loculated fluid possibly myeloma or abscess.  ABSCESS DRAINAGE,IR ULTRASOUND GUIDANCE TISSUE ABLATION  The purpose, procedure, and risks have been discussed with the patient and all  questions were answered. Thereafter, the patient consented to the proposed procedure.,  Comparison: CT 01/29/2012  Operator: Archer Asa Assist:  Deanne Coffer  Technique and findings:  The procedure, risks (including but not limited to bleeding, infection, organ damage), benefits, and alternatives were explained to the patient.  Questions regarding the procedure were encouraged and answered.  The patient understands and consents to the procedure. Survey ultrasound of the right upper abdomen was   performed and the perihepatic collection was localized.  An appropriate skin entry site was selected. Operator donned sterile  gloves and mask.   Site was marked, prepped with Betadine, draped in usual sterile fashion, infiltrated locally with 1% lidocaine.  Intravenous Fentanyl and Versed were administered as conscious sedation during continuous cardiorespiratory monitoring by the radiology RN, with a total moderate sedation time of 10 minutes.  Under real time ultrasound guidance, an 18 gauge needle was advanced into the perihepatic collection. Ultrasound documentation was stored . Cloudy thin fluid spontaneously returned through the needle hub.  An Amplatz guide wire advanced easily into the collection, confirmed on ultrasound.The tract was dilated to facilitate placement of a 10.2 French pigtail drain within the collection.  Contrast injection confirms appropriate positioning. No communication to the gallbladder or biliary tree is identified. 20 ml were aspirated, sent for Gram stain, culture and sensitivity. Catheter secured externally with O-Prolene suture and placed to gravity bag. The patient tolerated the procedure well. No immediate complication.  IMPRESSION: Technically successful abscess drainage catheter placement into perihepatic collection.  Original Report Authenticated By: Osa Craver, M.D.   Ir US Guide Bx Asp/drain  01/31/2012  *RADIOLOGY REPORT*  Clinical Data: Cholecystitis, status post cholecystostomy tube placement.  The tube was inadvertently withdrawn.  Pain.  Recent CT demonstrates perihepatic loculated fluid possibly myeloma or abscess.  ABSCESS DRAINAGE,IR ULTRASOUND GUIDANCE TISSUE ABLATION  The purpose, procedure, and risks have been discussed with the patient and all  questions were answered. Thereafter, the patient consented to the proposed procedure.,  Comparison: CT 01/29/2012  Operator: Archer Asa Assist:  Deanne Coffer  Technique and findings:  The procedure, risks (including but not limited to bleeding, infection, organ damage), benefits, and alternatives were explained to the patient.  Questions  regarding the procedure were encouraged and answered.  The patient  understands and consents to the procedure. Survey ultrasound of the right upper abdomen was   performed and the perihepatic collection was localized.  An appropriate skin entry site was selected. Operator donned sterile gloves and mask.   Site was marked, prepped with Betadine, draped in usual sterile fashion, infiltrated locally with 1% lidocaine.  Intravenous Fentanyl and Versed were administered as conscious sedation during continuous cardiorespiratory monitoring by the radiology RN, with a total moderate sedation time of 10 minutes.  Under real time ultrasound guidance, an 18 gauge needle was advanced into the perihepatic collection. Ultrasound documentation was stored . Cloudy thin fluid spontaneously returned through the needle hub.  An Amplatz guide wire advanced easily into the collection, confirmed on ultrasound.The tract was dilated to facilitate placement of a 10.2 French pigtail drain within the collection.  Contrast injection confirms appropriate positioning. No communication to the gallbladder or biliary tree is identified. 20 ml were aspirated, sent for Gram stain, culture and sensitivity. Catheter secured externally with O-Prolene suture and placed to gravity bag. The patient tolerated the procedure well. No immediate complication.  IMPRESSION: Technically successful abscess drainage catheter placement into perihepatic collection.  Original Report Authenticated By: Osa Craver, M.D.    Medications / Allergies: per chart  Antibiotics: Anti-infectives     Start     Dose/Rate Route Frequency Ordered Stop   02/01/12 0915   Ampicillin-Sulbactam (UNASYN) 3 g in sodium chloride 0.9 % 100 mL IVPB        3 g 100 mL/hr over 60 Minutes Intravenous Every 6 hours 02/01/12 0901     01/30/12 0400   vancomycin (VANCOCIN) IVPB 1000 mg/200 mL premix  Status:  Discontinued        1,000 mg 200 mL/hr over 60 Minutes Intravenous  Every 12 hours 01/29/12 1740 02/01/12 0901   01/29/12 2359   piperacillin-tazobactam (ZOSYN) IVPB 3.375 g  Status:  Discontinued        3.375 g 12.5 mL/hr over 240 Minutes Intravenous 3 times per day 01/29/12 1740 02/01/12 0901   01/29/12 1515   vancomycin (VANCOCIN) IVPB 1000 mg/200 mL premix        1,000 mg 200 mL/hr over 60 Minutes Intravenous  Once 01/29/12 1506 01/29/12 1735   01/29/12 1515  piperacillin-tazobactam (ZOSYN) IVPB 3.375 g       3.375 g 100 mL/hr over 30 Minutes Intravenous  Once 01/29/12 1506 01/29/12 1639

## 2012-02-01 NOTE — Evaluation (Signed)
Physical Therapy Evaluation Patient Details Name: Jose Crawford MRN: [id number] DOB: 1925-10-27 Today's Date: 02/01/2012 Time: 6578-4696 PT Time Calculation (min): 15 min  PT Assessment / Plan / Recommendation Clinical Impression  pt admitted from SNF after pulling drain from side which was inserted 01/31/12. Pt unable to provide HX. pt will benefit from PT to improve functional  obility and transfers to return to SNF    PT Assessment  Patient needs continued PT services    Follow Up Recommendations  Skilled nursing facility    Barriers to Discharge        Equipment Recommendations  Defer to next venue    Recommendations for Other Services OT consult   Frequency Min 2X/week    Precautions / Restrictions Precautions Precautions: Fall Precaution Comments: R side drain Restrictions Weight Bearing Restrictions: No   Pertinent Vitals/Pain C/o pain in abdomen/side. Was premedicated.     Mobility  Bed Mobility Bed Mobility: Supine to Sit Supine to Sit: 1: +2 Total assist;HOB elevated;With rails Supine to Sit: Patient Percentage: 50% Transfers Transfers: Sit to Stand;Stand to Sit Sit to Stand: 1: +2 Total assist;From bed Sit to Stand: Patient Percentage: 60% Stand to Sit: 4: Min assist;To bed Details for Transfer Assistance: VC to reach to bed, feel surface Ambulation/Gait Ambulation/Gait Assistance: 1: +2 Total assist Ambulation/Gait: Patient Percentage: 60% Ambulation Distance (Feet): 5 Feet (side steps along bed.) Assistive device: Rolling walker Ambulation/Gait Assistance Details: pt had strength enough to stand and sidestep along bed. using RW Gait Pattern: Step-to pattern    Exercises     PT Diagnosis: Difficulty walking;Acute pain  PT Problem List: Decreased strength;Decreased activity tolerance;Pain;Decreased mobility;Decreased cognition PT Treatment Interventions: DME instruction;Gait training;Patient/family education;Functional mobility training;Therapeutic  activities   PT Goals Acute Rehab PT Goals PT Goal Formulation: Patient unable to participate in goal setting Time For Goal Achievement: 02/15/12 Potential to Achieve Goals: Fair Pt will go Supine/Side to Sit: with supervision;with HOB 0 degrees PT Goal: Supine/Side to Sit - Progress: Goal set today Pt will go Sit to Supine/Side: with supervision PT Goal: Sit to Supine/Side - Progress: Goal set today Pt will go Stand to Sit: with supervision PT Goal: Stand to Sit - Progress: Goal set today Pt will Ambulate: 51 - 150 feet;with rolling walker;with min assist PT Goal: Ambulate - Progress: Goal set today  Visit Information  Last PT Received On: 02/01/12 Assistance Needed: +2 (for bed mobility)    Subjective Data  Subjective: oh, i hurt Patient Stated Goal: pt not able   Prior Functioning  Home Living Lives With:  (from [facility] per chart) Type of Home: Skilled Nursing Facility Prior Function Level of Independence: Needs assistance Communication Communication: No difficulties    Cognition  Overall Cognitive Status: History of cognitive impairments - at baseline Arousal/Alertness: Awake/alert Orientation Level: Disoriented to;Place;Time;Situation Behavior During Session: [facility] for tasks performed    Extremity/Trunk Assessment Right Upper Extremity Assessment RUE ROM/Strength/Tone: Within functional levels Left Upper Extremity Assessment LUE ROM/Strength/Tone: Within functional levels Right Lower Extremity Assessment RLE ROM/Strength/Tone: [facility] for tasks assessed Left Lower Extremity Assessment LLE ROM/Strength/Tone: [facility] for tasks assessed Trunk Assessment Trunk Assessment: Normal   Balance Static Sitting Balance Static Sitting - Balance Support: Feet unsupported;Bilateral upper extremity supported Static Sitting - Level of Assistance: 4: Min assist (tends to list posteriorly)  End of Session PT - End of Session Activity Tolerance: Patient limited by pain Patient left:  in bed Nurse Communication: Mobility status  GP     Rada Hay  02/01/2012, 1:11 PM

## 2012-02-01 NOTE — Op Note (Signed)
01/29/2012 - 02/01/2012  4:18 PM  PATIENT:  Jose Crawford  78 y.o. male  Patient Care Team: Georgann Housekeeper, MD as PCP - General (Internal Medicine)  PRE-OPERATIVE DIAGNOSIS:  cholecystitis with cholecystlithiasis  POST-OPERATIVE DIAGNOSIS:    Recurrent acute cholecystitis with cholecystolithiasis Perihepatic abscess Possible choledocholithiasis, nonobstructive  PROCEDURE:  Procedure(s): LAPAROSCOPIC CHOLECYSTECTOMY WITH INTRAOPERATIVE CHOLANGIOGRAM Lap lysis of adhesions x (50% of case) Lap I&D of perihepatic abdominal abscess   SURGEON:  Surgeon(s): Ardeth Sportsman, MD  ASSISTANT: none   ANESTHESIA:   local and general  EBL:  Total I/O In: 2005 [I.V.:2000; Other:5] Out: 750 [Urine:700; Blood:50]  Delay start of Pharmacological VTE agent (>24hrs) due to surgical blood loss or risk of bleeding:  no  DRAINS: (15Fr) Blake drain(s) in the RUQ   SPECIMEN:  Source of Specimen:  1.  Gallbladder  2. Abscess wall  DISPOSITION OF SPECIMEN:  PATHOLOGY  COUNTS:  YES  PLAN OF CARE: Admit to inpatient   PATIENT DISPOSITION:  PACU - hemodynamically stable.  INDICATION: Male with acute cholecystitis.  Underwent percutaneous drainage in [id number].  Gradually improved.  Interval cholecystectomy at been held off until at least 6 weeks out from surgery.  He also had a thymoma that required thoracic surgery to resect and he is recovered from that. Has been confused and has pulled out his drain a few times.   He returned after pulling the drain out with fevers and elevated white count.  Found to have a perihepatic abscess.  This was drained.  Improved.  Given the fact he is more in six weeks out & the abscess was not adherent to the gallbladder, I recommended consideration of cholecystectomy.  I discussed with the patient's son and daughter given the patient's moderate dementia:  The anatomy & physiology of hepatobiliary & pancreatic function was discussed.  The pathophysiology of  gallbladder dysfunction was discussed.  Natural history risks without surgery was discussed.   I feel the risks of no intervention will lead to serious problems that outweigh the operative risks; therefore, I recommended cholecystectomy to remove the pathology.  I explained laparoscopic techniques with possible need for an open approach.  Probable cholangiogram to evaluate the bilary tract was explained as well.    Risks such as bleeding, infection, abscess, leak, injury to other organs, need for further treatment, heart attack, death, and other risks were discussed.  I noted a good likelihood this will help address the problem.  Possibility that this will not correct all abdominal symptoms was explained.  Goals of post-operative recovery were discussed as well.  We will work to minimize complications.  An educational handout further explaining the pathology and treatment options was given as well.  Questions were answered.  The patient expresses understanding & wishes to proceed with surgery.  OR FINDINGS: He had very dense adhesions of greater omentum and colon and duodenum to the gallbladder consistent with the acute cholecystitis.  He had persistent abscess or cavity on the right perihepatic space.  His cholangiogram were showed normal anatomy.  Possible two small rectangular distal common bile duct defects but nonobstructing.  DESCRIPTION:   The patient was identified & brought into the operating room. The patient was positioned supine with arms tucked. SCDs were active during the entire case. The patient underwent general anesthesia without any difficulty.  The abdomen was prepped and draped in a sterile fashion. A Surgical Timeout confirmed our plan.  We positioned the patient in reverse [place] & right side up.  I placed a 5mm laparoscopic port through the abdominal wall using optical entry technique in the right upper quadrant.  Entry was clean.  We induced carbon dioxide insufflation.  Camera inspection revealed no injury.   I proceeded to continue with laparoscopic technique. I placed a #5 port in supraumbilical region, another 5mm port in the right flank near the anterior axillary line, and a 10mm port in the left subxiphoid region obliquely within the falciform ligament.  I turned attention to the right upper quadrant.  Patient had dense inflammation of hepatic flexure of colon and greater omentum to his whole right liver.  Gradually mobilized that off the right chest wall and abdominal wall.  I got into the abscess cavity the percutaneous drain was then released some abscess material.  I removed the percutaneous drain.  I freed the greater omentum off using a blunt and focused cautery dissection.  I freed the colon off carefully as well.  The gallbladder cannot be seen if there is a large phlegmon vault.  Eventually found greater omentum and freed that off would appear to be the dome the gallbladder.  I followed the hepatic flexure the colon to the mid transverse colon.  I found epiploic appendages adherent to the gallbladder and gradually freed them off using hydrodissection with the sucker and focused hook cautery and as well as focused blunt and sharp dissection.  The gallbladder dome was too concrete hard to grasp initially.  We found a tenaculum grasper to help elevate the gallbladder.  With that I could come behind to the left lateral gallbladder wall.  Where it was less inflamed.  I could come from nonadherent material more deeply and come more anteriorly to the more inflamed adhesions.  Eventually he gradually feels freed all that off.  The duodenal bulb was adherent near the infundibulum but fortunately those were more acute inflammatory lesions in and not particularly hard.  Eventually again elevate the gallbladder and identify the infundibulum. The gallbladder fundus was elevated cephalad. I used hook cautery to free the peritoneal coverings between the gallbladder and the  liver on the posteriolateral and anteriomedial walls.   I used careful blunt and hook dissection to help get a good critical view of the cystic artery and cystic duct. I did further dissection to free a few centimeters of the gallbladder off the liver bed to get a good critical view of the infundibulum and cystic duct. I mobilized the cystic artery; and, after getting a good 360 view, ligated the cystic artery using clips. Initially did have some bleeding on the cystic artery which was a source of some bleeding but eventually controlled that well with the clips.   I skeletonized the cystic duct.  He had some engorged venous plexus between the gallbladder and the liver is I isolated ,skeletonized, and controlled with clips and cautery as well.  I placed a clip on the infundibulum. I did a partial cystic duct-otomy and ensured patency. I placed a 5 [place] cholangiocatheter through a puncture site at the right subcostal ridge of the abdominal wall and directed it into the cystic duct.  We ran a cholangiogram with dilute radio-opaque contrast and continuous fluoroscopy.  Contrast flowed from a side branch consistent with cystic duct cannulization. Contrast flowed up the common hepatic duct into the right and left intrahepatic chains out to secondary radicals. Contrast flowed down the common bile duct briskly & easily across the normal ampulla into the duodenum.  There are a few linear rectangular hypolucencies  in the distal common bile duct.  However it seemed a lower regular and swirling.  I think there is perhaps a small diverticulum there is well.  I felt was more consistent with sludge and not true stones for certain.  Contrast easily refluxed up the pancreatic duct as well.  Flow was good with no slowing at the ampulla.   I removed the cholangiocatheter. I placed clips on the cystic duct x4.   I completed cystic duct transection. I used a 0 PDS Endoloop for further cystic duct ligation given the fact that  the cystic duct was inflamed and had had chronic thickening.   I freed the gallbladder from its remaining attachments to the liver. I ensured hemostasis on the gallbladder fossa of the liver and elsewhere. I inspected the rest of the abdomen & detected no injury nor bleeding elsewhere.  100 treated the abscess wall off the peritoneum and chest wall as well as the anterior lateral right hepatic lobe.  I removed numerous abscess wall fragments.  I assured hemostasis.  I did copious irrigation.  I irrigated over 3 L.  I did a final irrigation of 500 mL of antibiotic solution (600 mg clindamycin and 240 mg gentamicin in 500 mL).  I removed the gallbladder.  Because he had significant phlegmon and friable tissues and an abscess cavity, I did leave a drain.  I secured at the skin using 0 silk stitches x2 given his propensity to pull the drains out.  I closed the subxiphoid fascia transversely using 0 Vicryl interrupted stitches.   A closed the skin using 4-0 monocryl stitch.  Sterile dressings were applied. The patient was extubated & arrived in the PACU in stable condition..  I had discussed postoperative care with the patient in the holding area.  I am about to locate the patient's family and discuss operative findings and postoperative goals / instructions.  Instructions are written in the chart as well.

## 2012-02-01 NOTE — Progress Notes (Signed)
Inserted 14 french foley, clear yellow urine.  Met minimal resistance

## 2012-02-01 NOTE — Progress Notes (Signed)
PROGRESS NOTE  Jose Crawford:[id number] DOB: [birth year] DOA: 01/29/2012 PCP: Georgann Housekeeper, MD  Brief narrative: Jose Crawford is an 78 y.o. male with a PMH of thymoma status post thoracotomy on 01/13/12 and cholecystitis treated with a cholecystostomy tube as well as advanced dementia who was sent to the hospital from his SNF with fever that began after pulling out his cholecystostomy tube. The surgeons have re-evaluated him and now feel that he is stable for a cholecystectomy.  Family has been notified, and surgeon is awaiting final consent before proceeding.  Interim History: Stable overnight.  Patient remains confused, disoriented.  Has no recollection of the abscess drain being placed.  Assessment/Plan: Principal Problem:  *Fever and leukocytosis secondary to recurrent cholecystitis / Perihepatic abscess RUQ  Admitted and a diagnostic evaluation confirmed recurrent cholecystitis as the probable source of fever.  Placed on empiric Vancomycin and Zosyn, narrowed to Unasyn 02/01/12.  WBC normalized.  Fever resolved.  Discussed case with [name] on 01/29/12, who saw him 01/30/12 and with [name] of IR.  Underwent percutaneous drainage of fluid collection per IR & now plan is to proceed with inpatient cholecystectomy if family consents, which may be done as soon as this afternoon. Active Problems:  Urinary Retention  Bladder scan done last night, 600 cc residual noted, foley placed.  Will need voiding trial prior to discharge. Constipation  Give dulcolax suppository today.  Start daily MiraLax and dulcolax PR PRN. Dementia   Advanced. Disoriented, anxious.   Return to SNF when stable. Thymoma   Status post resection/thoracotomy. No evidence of post operative wound infection.  F/U with [name] after discharge. Hyponatremia   Maybe SIADH from malignancy versus dehydration.   Improving with IVF.  Hypercalcemia likely secondary to hyperparathyroidism  Calcium  initially normalized with IVF.  A review of the patient's records indicate elevated PTH levels (153.4 on 12/05/11), no elevation of PTH related peptide, and a TSH of 1.029.  Start Sensipar, may be secondary to thymoma associated hyperparathyroidism.  Monitor calcium levels closely.  Oncology follow up.  Normocytic anemia  Slight drop in hemoglobin likely dilutional, with underlying AOCD contributory.  Code Status: DNR Family Communication: Spoke with son on phone 01/31/12. [name] [phone]).  Dr. [name] updated him regarding need for cholecystectomy 01/31/12. Disposition Plan: SNF when stable.  Medical Consultants:  [name] and Dr. [name], Surgery  [name], Interventional Radiology  Other consultants:  Physical therapy  Occupational therapy  Antibiotics:  Vancomycin 01/29/12--->02/01/12  Zosyn 01/29/12--->02/01/12  Unasyn 02/01/12--->   Subjective  Mr. Soth is pleasantly confused.  He thinks he needs to urinate (catheter placed for urinary retention last night).  States his bowels have not moved in 3 days, and that his abdomen "hurts all over".  Denies dyspnea, nausea, vomiting.   Objective   Objective: Filed Vitals:   01/31/12 1502 01/31/12 1550 01/31/12 2146 02/01/12 0626  BP: 109/68 114/65 108/67 132/72  Pulse: 67 75 75 65  Temp: 98 F (36.7 C) 97.9 F (36.6 C) 97.7 F (36.5 C) 97.5 F (36.4 C)  TempSrc: Oral Oral Oral Oral  Resp: 16 16 18 16   Height:      Weight:      SpO2: 97% 95% 93% 98%    Intake/Output Summary (Last 24 hours) at 02/01/12 0949 Last data filed at 02/01/12 0941  Gross per 24 hour  Intake   4228 ml  Output   1445 ml  Net   2783 ml    Exam: Gen:  Anxious and confused. Cardiovascular:  RRR, No M/R/G Respiratory: Lungs CTAB Gastrointestinal: Abdomen tender.  Abscess drain RUQ. Extremities: No C/E/C  Data Reviewed: Basic Metabolic Panel:  Lab 02/01/12 9629 01/30/12 0429 01/29/12 1422  NA 132* 129* 131*  K 3.5 3.7 --  CL 100 99 98   CO2 25 23 23   GLUCOSE 100* 135* 131*  BUN 13 16 16   CREATININE 0.94 0.89 0.89  CALCIUM 10.8* 10.3 11.4*  MG -- -- --  PHOS -- -- --   GFR Estimated Creatinine Clearance: 60.5 ml/min (by C-G formula based on Cr of 0.94). Liver Function Tests:  Lab 01/29/12 1422  AST 15  ALT 11  ALKPHOS 63  BILITOT 1.1  PROT 6.3  ALBUMIN 3.3*   CBC:  Lab 02/01/12 0338 01/30/12 0429 01/29/12 1422  WBC 7.8 22.0* 28.8*  NEUTROABS -- -- 26.3*  HGB 10.2* 10.9* 12.9*  HCT 31.5* 32.5* 38.5*  MCV 92.6 91.3 91.9  PLT 341 349 403*   Microbiology Recent Results (from the past 240 hour(s))  CULTURE, BLOOD (ROUTINE X 2)     Status: Normal (Preliminary result)   Collection Time   01/29/12  2:22 PM      Component Value Range Status Comment   Specimen Description BLOOD LEFT IV SITE   Final    Special Requests BOTTLES DRAWN AEROBIC AND ANAEROBIC 5 CC EACH   Final    Culture  Setup Time 01/29/2012 18:35   Final    Culture     Final    Value:        BLOOD CULTURE RECEIVED NO GROWTH TO DATE CULTURE WILL BE HELD FOR 5 DAYS BEFORE ISSUING A FINAL NEGATIVE REPORT   Report Status PENDING   Incomplete   URINE CULTURE     Status: Normal   Collection Time   01/29/12  2:44 PM      Component Value Range Status Comment   Specimen Description URINE, CATHETERIZED   Final    Special Requests NONE   Final    Culture  Setup Time 01/29/2012 18:34   Final    Colony Count 2,000 COLONIES/ML   Final    Culture ESCHERICHIA COLI   Final    Report Status 01/31/2012 FINAL   Final    Organism ID, Bacteria ESCHERICHIA COLI   Final   CULTURE, BLOOD (ROUTINE X 2)     Status: Normal (Preliminary result)   Collection Time   01/29/12  3:26 PM      Component Value Range Status Comment   Specimen Description BLOOD RIGHT FOREARM   Final    Special Requests BOTTLES DRAWN AEROBIC ONLY 1.5 CC   Final    Culture  Setup Time 01/29/2012 18:35   Final    Culture     Final    Value:        BLOOD CULTURE RECEIVED NO GROWTH TO DATE CULTURE  WILL BE HELD FOR 5 DAYS BEFORE ISSUING A FINAL NEGATIVE REPORT   Report Status PENDING   Incomplete   MRSA PCR SCREENING     Status: Normal   Collection Time   01/30/12 10:35 AM      Component Value Range Status Comment   MRSA by PCR NEGATIVE  NEGATIVE Final   CULTURE, ROUTINE-ABSCESS     Status: Normal (Preliminary result)   Collection Time   01/31/12  1:24 PM      Component Value Range Status Comment   Specimen Description PERITONEAL CAVITY   Final  Special Requests Normal   Final    Gram Stain     Final    Value: MODERATE WBC PRESENT,BOTH PMN AND MONONUCLEAR     NO SQUAMOUS EPITHELIAL CELLS SEEN     RARE GRAM NEGATIVE RODS   Culture PENDING   Incomplete    Report Status PENDING   Incomplete   ANAEROBIC CULTURE     Status: Normal (Preliminary result)   Collection Time   01/31/12  1:24 PM      Component Value Range Status Comment   Specimen Description PERITONEAL CAVITY   Final    Special Requests Normal   Final    Gram Stain     Final    Value: ABUNDANT WBC PRESENT, PREDOMINANTLY PMN     NO SQUAMOUS EPITHELIAL CELLS SEEN     RARE GRAM NEGATIVE RODS   Culture PENDING   Incomplete    Report Status PENDING   Incomplete     Procedures and Diagnostic Studies:  Dg Chest 2 View 01/29/2012  IMPRESSION: Resolution of the left pneumothorax since 01/17/2012.  Residual small bilateral pleural effusions and associated passive atelectasis in the lower lobes.  No acute cardiopulmonary disease otherwise.  Original Report Authenticated By: Arnell Sieving, M.D.    Ct Chest W Contrast 01/29/2012   IMPRESSION:  1.  No evidence of empyema or pulmonary abscess following thymoma resection.  2.  Bibasilar atelectasis and small effusions. 3.  Stable ascending aortic aneurysm. Original Report Authenticated By: Genevive Bi, M.D.    Ct Abdomen W Contrast 01/29/2012  IMPRESSION:  1.  New fluid collection along the right lateral hepatic lobe adjacent to gallbladder fossa but could represent an early  abscess. This is in the course of the prior cholecystostomy tube. 2.  The gallbladder is decompressed and thick-walled.  Original Report Authenticated By: Genevive Bi, M.D.   Scheduled Meds:    . ampicillin-sulbactam (UNASYN) IV  3 g Intravenous Q6H  . fentaNYL      . fentaNYL  50 mcg Intravenous Once  . lidocaine      . lidocaine   Urethral Once  . midazolam      . nystatin cream  1 application Topical BID  . DISCONTD: piperacillin-tazobactam (ZOSYN)  IV  3.375 g Intravenous Q8H  . DISCONTD: vancomycin  1,000 mg Intravenous Q12H   Continuous Infusions:    . sodium chloride 50 mL/hr at 02/01/12 0248      LOS: 3 days   Hillery Aldo, MD Pager [phone]  02/01/2012, 9:49 AM

## 2012-02-01 NOTE — Evaluation (Signed)
Occupational Therapy Evaluation Patient Details Name: Jose Crawford MRN: [id number] DOB: 1926-04-08 Today's Date: 02/01/2012 Time: 4098-1191 OT Time Calculation (min): 16 min  OT Assessment / Plan / Recommendation Clinical Impression  This 78 year old man was admitted with fever secondary to recurrent cholecystitis.  He has a h/o advanced dementia and is from a SNF.  Pt follows commands; we will follow him in acute to increased strength and activity tolerance for toilet transfers and grooming    OT Assessment  Patient needs continued OT Services    Follow Up Recommendations  Skilled nursing facility    Barriers to Discharge      Equipment Recommendations  Defer to next venue    Recommendations for Other Services    Frequency  Min 1X/week    Precautions / Restrictions Precautions Precautions: Fall Restrictions Weight Bearing Restrictions: No   Pertinent Vitals/Pain Pain R side--not rated.  Premedicated    ADL  Eating/Feeding: Performed;Minimal assistance (drink) Where Assessed - Eating/Feeding: Bed level Grooming: Performed;Wash/dry face;Set up Where Assessed - Grooming: Unsupported sitting Upper Body Bathing: Simulated;Minimal assistance (guard lines/dressing) Where Assessed - Upper Body Bathing: Unsupported sitting Lower Body Bathing: Simulated;Maximal assistance Where Assessed - Lower Body Bathing: Supported sit to stand Upper Body Dressing: Simulated;Minimal assistance (lines) Where Assessed - Upper Body Dressing: Unsupported sitting Lower Body Dressing: Simulated;+1 Total assistance Where Assessed - Lower Body Dressing: Supported sit to stand Toileting - Architect and Hygiene: Simulated;Moderate assistance Where Assessed - Toileting Clothing Manipulation and Hygiene: Standing Equipment Used: Rolling walker Transfers/Ambulation Related to ADLs: side stepped up bed with min A    OT Diagnosis: Generalized weakness  OT Problem List: Decreased  strength;Decreased activity tolerance;Impaired balance (sitting and/or standing);Decreased cognition;Decreased safety awareness;Pain OT Treatment Interventions: Self-care/ADL training;Therapeutic activities;Patient/family education;Balance training;Cognitive remediation/compensation   OT Goals Acute Rehab OT Goals OT Goal Formulation: Patient unable to participate in goal setting Time For Goal Achievement: 02/15/12 Potential to Achieve Goals: Good ADL Goals Pt Will Perform Grooming: Other (comment);Standing at sink;with min assist (min guard) ADL Goal: Grooming - Progress: Goal set today Pt Will Transfer to Toilet: with min assist;Ambulation;3-in-1 (min guard, min cues for safety) ADL Goal: Toilet Transfer - Progress: Goal set today Pt Will Perform Toileting - Hygiene: Sit to stand from 3-in-1/toilet;with min assist (min guard) ADL Goal: Toileting - Hygiene - Progress: Goal set today  Visit Information  Last OT Received On: 02/01/12 Assistance Needed: +2 (for bed mobility) PT/OT Co-Evaluation/Treatment: Yes    Subjective Data  Subjective: "I live with my wife" Patient Stated Goal: none stated.  Agreeable to OT/PT   Prior Functioning  Home Living Lives With:  (from [facility] per chart) Prior Function Level of Independence:  (unknown) Communication Communication: No difficulties    Cognition  Overall Cognitive Status: History of cognitive impairments - at baseline Arousal/Alertness: Awake/alert Orientation Level: Disoriented to;Place;Time;Situation Behavior During Session: [facility] for tasks performed    Extremity/Trunk Assessment Right Upper Extremity Assessment RUE ROM/Strength/Tone: Within functional levels Left Upper Extremity Assessment LUE ROM/Strength/Tone: Within functional levels   Mobility Bed Mobility Bed Mobility: Supine to Sit Supine to Sit: 1: +2 Total assist;HOB elevated;With rails Supine to Sit: Patient Percentage: 50%   Exercise    Balance    End of  Session OT - End of Session Activity Tolerance: Patient limited by fatigue Patient left: in bed;with call bell/phone within reach;with bed alarm set  GO     Markel Mergenthaler 02/01/2012, 11:50 AM Marica Otter, OTR/L [phone] 02/01/2012

## 2012-02-02 ENCOUNTER — Encounter (HOSPITAL_COMMUNITY): Payer: Self-pay | Admitting: Surgery

## 2012-02-02 DIAGNOSIS — F0390 Unspecified dementia without behavioral disturbance: Secondary | ICD-10-CM

## 2012-02-02 DIAGNOSIS — K8050 Calculus of bile duct without cholangitis or cholecystitis without obstruction: Secondary | ICD-10-CM

## 2012-02-02 LAB — BASIC METABOLIC PANEL
CO2: 27 mEq/L (ref 19–32)
Calcium: 10.9 mg/dL — ABNORMAL HIGH (ref 8.4–10.5)
Chloride: 99 mEq/L (ref 96–112)
Creatinine, Ser: 0.74 mg/dL (ref 0.50–1.35)
GFR calc Af Amer: >90 mL/min (ref >90)
Sodium: 133 mEq/L — ABNORMAL LOW (ref 135–145)

## 2012-02-02 LAB — CBC
HCT: 34.7 % — ABNORMAL LOW (ref 39.0–52.0)
Platelets: 390 10*3/uL (ref 150–400)
RBC: 3.79 MIL/uL — ABNORMAL LOW (ref 4.22–5.81)
RDW: 14 % (ref 11.5–15.5)
WBC: 10 10*3/uL (ref 4.0–10.5)

## 2012-02-02 MED ORDER — LORAZEPAM 2 MG/ML IJ SOLN
1.0000 mg | Freq: Once | INTRAMUSCULAR | Status: AC
  Administered 2012-02-02: 1 mg via INTRAVENOUS
  Filled 2012-02-02: qty 1

## 2012-02-02 NOTE — Progress Notes (Signed)
Clinical Social Work Department BRIEF PSYCHOSOCIAL ASSESSMENT 02/02/2012  Patient:  FITZHUGH, VIZCARRONDO     Account Number:  [id number]     Admit date:  01/29/2012  Clinical Social Worker:  Tommi Emery, CLINICAL SOCIAL WORKER  Date/Time:  02/01/2012 09:18 AM  Referred by:  Physician  Date Referred:  01/31/2012  Other Referral:   Interview type:  Other - See comment Other interview type:   CSW first recieved word from [facility], that the family was wanting to return. Then CSW spoke with the family about their  concerns and placement options.    PSYCHOSOCIAL DATA Living Status:  FACILITY Admitted from facility:  [facility] Level of care:  Skilled Nursing Facility Primary support name:  [name] and [name] Primary support relationship to patient:  CHILD, ADULT Degree of support available:   good; Both children are active in the care of their facther. they do have some conerns with financies and paying for rehab treatment, nonetheless, they are active about communicating with the family needs as this time.    CURRENT CONCERNS Current Concerns  Adjustment to Illness  Financial Resources  Other - See comment   Other Concerns:   The family did express some frustraiton about how long he would be in the hospital. they are unable to hold his bed at [facility], but hope that he can return there.    SOCIAL WORK ASSESSMENT / PLAN the family will need help with disposition planning. they have their hearts set of the father returning to [facility] and that is thier top perference. Prior to discharge, other skilled options will need to be sought out, just incase [facility] does not have a bed availability. [facility] did say they would take him back, if they had a bed and could meet his medical needs   Assessment/plan status:  Psychosocial Support/Ongoing Assessment of Needs Other assessment/ plan:   Currently no other plans have been identified for the  family. The family may needs some financial advisement, however that will be pending facility placement in the future.   Information/referral to community resources:    PATIENT'S/FAMILY'S RESPONSE TO PLAN OF CARE: the family is okay with their father returning to [facility]. They are awre that [facility] may not be available upon discharge. They are also aware that other placement options will have to be consulted.      Clinical Social Work Department BRIEF PSYCHOSOCIAL ASSESSMENT 02/02/2012  Patient:  JEROMIE, GAINOR     Account Number:  [id number]     Admit date:  01/29/2012  Clinical Social Worker:  Tommi Emery, CLINICAL SOCIAL WORKER  Date/Time:  02/01/2012 09:18 AM  Referred by:  Physician  Date Referred:  01/31/2012  Other Referral:   Interview type:  Other - See comment Other interview type:   CSW first recieved word from [facility], that the family was wanting to return. Then CSW spoke with the family about their  concerns and placement options.    PSYCHOSOCIAL DATA Living Status:  FACILITY Admitted from facility:  [facility] Level of care:  Skilled Nursing Facility Primary support name:  [name] and [name] Primary support relationship to patient:  CHILD, ADULT Degree of support available:   good; Both children are active in the care of their facther. they do have some conerns with financies and paying for rehab treatment, nonetheless, they are active about communicating with the family needs as this time.    CURRENT CONCERNS Current Concerns  Adjustment to Illness  Financial Resources  Other -  See comment   Other Concerns:   The family did express some frustraiton about how long he would be in the hospital. they are unable to hold his bed at [facility], but hope that he can return there.    SOCIAL WORK ASSESSMENT / PLAN the family will need help with disposition planning. they have their hearts set of the father returning to  [facility] and that is thier top perference. Prior to discharge, other skilled options will need to be sought out, just incase [facility] does not have a bed availability. [facility] did say they would take him back, if they had a bed and could meet his medical needs   Assessment/plan status:  Psychosocial Support/Ongoing Assessment of Needs Other assessment/ plan:   Currently no other plans have been identified for the family. The family may needs some financial advisement, however that will be pending facility placement in the future.   Information/referral to community resources:    PATIENT'S/FAMILY'S RESPONSE TO PLAN OF CARE: the family is okay with their father returning to [facility]. They are awre that [facility] may not be available upon discharge. They are also aware that other placement options will have to be consulted.       Kayleen Memos. Leighton Ruff [phone]

## 2012-02-02 NOTE — Progress Notes (Signed)
MIHAIL PRETTYMAN [id number] [birth year]  CARE TEAM:  PCP: Georgann Housekeeper, MD  Outpatient Care Team: Patient Care Team: Georgann Housekeeper, MD as PCP - General (Internal Medicine)  Inpatient Treatment Team: Treatment Team: Attending Provider: Alison Murray, MD; Technician: Barbette Or, NT; Rounding Team: Alton Revere, MD; Registered Nurse: Virgina Organ, RN; Consulting Physician: Bishop Limbo, MD; Registered Nurse: Adriana Simas, RN; Technician: Isaias Cowman, NT; Technician: Gwenlyn Perking, [place]; Registered Nurse: Velda Shell, RN  Subjective:  Resting in NAD No events Clears at bedside  Objective:  Vital signs:  Filed Vitals:   02/01/12 1715 02/01/12 2022 02/02/12 0052 02/02/12 0514  BP: 124/74 137/84 124/74 125/69  Pulse: 94 93 95 97  Temp: 97.7 F (36.5 C) 97.8 F (36.6 C) 98 F (36.7 C) 99.1 F (37.3 C)  TempSrc:  Oral Oral Oral  Resp: 16 15 19 18   Height:      Weight:      SpO2: 93% 93% 91% 92%    Last BM Date: 01/19/12 (pta)  Intake/Output   Yesterday:  07/09 0701 - 07/10 0700 In: 4063 [P.O.:150; I.V.:3808; IV Piggyback:100] Out: 2920 [Urine:2800; Drains:70; Blood:50] This shift:     Bowel function:  Flatus: ?  BM: No  Physical Exam:  General: Pt awakens in no acute distress Eyes: PERRL, normal EOM.  Sclera clear.  No icterus Neuro: CN II-XII intact w/o focal sensory/motor deficits. Lymph: No head/neck/groin lymphadenopathy Psych:  No delerium/psychosis/paranoia HENT: Normocephalic, Mucus membranes moist.  No thrush Neck: Supple, No tracheal deviation Chest: No chest wall pain w good excursion CV:  Pulses intact.  Regular rhythm Abdomen: Soft.  Nondistended.  Mildly tender at incisions only.  No incarcerated hernias.  Drain serosanguinous Ext:  SCDs BLE.  No mjr edema.  No cyanosis Skin: No petechiae / purpurae  Problem List:  Principal Problem:  *Recent Acute calculous cholecystitis Active Problems:  Abscess,  RUQ perihepatic  Choledocholithiasis, nonobstructive  Dementia  Thymoma  Fever and leukocytosis secondary to recurrent cholecystitis   Hyponatremia  Hypercalcemia secondary to hyperparathyroidism  Normocytic anemia  Constipation   Assessment  [name]  78 y.o. male  1 Day Post-Op  Procedure(s): LAPAROSCOPIC CHOLECYSTECTOMY WITH INTRAOPERATIVE CHOLANGIOGRAM  Stable  Plan:  -adv diet -drain care - prob keep 7-14 days until output <30/day x2 days in a row -ABx x 7 more days.  Cont IV while inpatient.  Transition to PO Augmentin at D/C -Dementia stable - per primary team -check LFTs - hold off on ERCP with slight CBD stones - nonocclusive  D/C patient from hospital when patient meets criteria (later today vs tomorrow):  Tolerating oral intake well Ambulating in walkways Adequate pain control without IV medications Urinating  Having flatus   -VTE prophylaxis- SCDs, etc -mobilize as tolerated to help recovery  Ardeth Sportsman, M.D., F.A.C.S. Gastrointestinal and Minimally Invasive Surgery [facility] [address] [place], [place] [address] [phone] Main / Paging [phone] Voice Mail   02/02/2012  Results:   Labs: Results for orders placed during the hospital encounter of 01/29/12 (from the past 48 hour(s))  APTT     Status: Normal   Collection Time   01/31/12  9:10 AM      Component Value Range Comment   aPTT 36  24 - 37 seconds   PROTIME-INR     Status: Normal   Collection Time   01/31/12  9:10 AM      Component  Value Range Comment   Prothrombin Time 13.6  11.6 - 15.2 seconds    INR 1.02  0.00 - 1.49   CULTURE, ROUTINE-ABSCESS     Status: Normal (Preliminary result)   Collection Time   01/31/12  1:24 PM      Component Value Range Comment   Specimen Description PERITONEAL CAVITY      Special Requests Normal      Gram Stain        Value: MODERATE WBC PRESENT,BOTH PMN AND MONONUCLEAR     NO SQUAMOUS  EPITHELIAL CELLS SEEN     RARE GRAM NEGATIVE RODS   Culture MODERATE ESCHERICHIA COLI      Report Status PENDING     ANAEROBIC CULTURE     Status: Normal (Preliminary result)   Collection Time   01/31/12  1:24 PM      Component Value Range Comment   Specimen Description PERITONEAL CAVITY      Special Requests Normal      Gram Stain        Value: ABUNDANT WBC PRESENT, PREDOMINANTLY PMN     NO SQUAMOUS EPITHELIAL CELLS SEEN     RARE GRAM NEGATIVE RODS   Culture        Value: NO ANAEROBES ISOLATED; CULTURE IN PROGRESS FOR 5 DAYS   Report Status PENDING     BASIC METABOLIC PANEL     Status: Abnormal   Collection Time   02/01/12  3:38 AM      Component Value Range Comment   Sodium 132 (*) 135 - 145 mEq/L    Potassium 3.5  3.5 - 5.1 mEq/L    Chloride 100  96 - 112 mEq/L    CO2 25  19 - 32 mEq/L    Glucose, Bld 100 (*) 70 - 99 mg/dL    BUN 13  6 - 23 mg/dL    Creatinine, Ser 1.61  0.50 - 1.35 mg/dL    Calcium 09.6 (*) 8.4 - 10.5 mg/dL    GFR calc non Af Amer 75 (*) >90 mL/min    GFR calc Af Amer 87 (*) >90 mL/min   CBC     Status: Abnormal   Collection Time   02/01/12  3:38 AM      Component Value Range Comment   WBC 7.8  4.0 - 10.5 K/uL    RBC 3.40 (*) 4.22 - 5.81 MIL/uL    Hemoglobin 10.2 (*) 13.0 - 17.0 g/dL    HCT 04.5 (*) 40.9 - 52.0 %    MCV 92.6  78.0 - 100.0 fL    MCH 30.0  26.0 - 34.0 pg    MCHC 32.4  30.0 - 36.0 g/dL    RDW 81.1  91.4 - 78.2 %    Platelets 341  150 - 400 K/uL   SURGICAL PCR SCREEN     Status: Abnormal   Collection Time   02/01/12 12:29 PM      Component Value Range Comment   MRSA, PCR NEGATIVE  NEGATIVE    Staphylococcus aureus POSITIVE (*) NEGATIVE   BASIC METABOLIC PANEL     Status: Abnormal   Collection Time   02/02/12  4:05 AM      Component Value Range Comment   Sodium 133 (*) 135 - 145 mEq/L    Potassium 3.5  3.5 - 5.1 mEq/L    Chloride 99  96 - 112 mEq/L    CO2 27  19 - 32 mEq/L    Glucose, Bld  103 (*) 70 - 99 mg/dL    BUN 6  6 - 23 mg/dL      Creatinine, Ser 4.54  0.50 - 1.35 mg/dL    Calcium 09.8 (*) 8.4 - 10.5 mg/dL    GFR calc non Af Amer 82 (*) >90 mL/min    GFR calc Af Amer >90  >90 mL/min   CBC     Status: Abnormal   Collection Time   02/02/12  4:05 AM      Component Value Range Comment   WBC 10.0  4.0 - 10.5 K/uL    RBC 3.79 (*) 4.22 - 5.81 MIL/uL    Hemoglobin 11.2 (*) 13.0 - 17.0 g/dL    HCT 11.9 (*) 14.7 - 52.0 %    MCV 91.6  78.0 - 100.0 fL    MCH 29.6  26.0 - 34.0 pg    MCHC 32.3  30.0 - 36.0 g/dL    RDW 82.9  56.2 - 13.0 %    Platelets 390  150 - 400 K/uL     Imaging / Studies: Dg Cholangiogram Operative  02/01/2012  *RADIOLOGY REPORT*  Clinical Data:   Cholelithiasis  INTRAOPERATIVE CHOLANGIOGRAM  Technique:  Cholangiographic images from the C-arm fluoroscopic device were submitted for interpretation post-operatively.  Please see the procedural report for the amount of contrast and the fluoroscopy time utilized.  Comparison:  None  Findings: There are 2 small nonocclusive persistent filling defects in the distal common duct. Intrahepatic ducts are incompletely visualized, appearing decompressed centrally. Contrast passes into the duodenum. A small amount contrast refluxes into the pancreatic duct.  IMPRESSION  1.  Persistent small filling defects in the distal CBD suggesting retained nonocclusive calculi.  Original Report Authenticated By: Osa Craver, M.D.   Ir Era Skeen W Catheter Placement  01/31/2012  *RADIOLOGY REPORT*  Clinical Data: Cholecystitis, status post cholecystostomy tube placement.  The tube was inadvertently withdrawn.  Pain.  Recent CT demonstrates perihepatic loculated fluid possibly myeloma or abscess.  ABSCESS DRAINAGE,IR ULTRASOUND GUIDANCE TISSUE ABLATION  The purpose, procedure, and risks have been discussed with the patient and all  questions were answered. Thereafter, the patient consented to the proposed procedure.,  Comparison: CT 01/29/2012  Operator: Archer Asa Assist:   Deanne Coffer  Technique and findings:  The procedure, risks (including but not limited to bleeding, infection, organ damage), benefits, and alternatives were explained to the patient.  Questions regarding the procedure were encouraged and answered.  The patient understands and consents to the procedure. Survey ultrasound of the right upper abdomen was   performed and the perihepatic collection was localized.  An appropriate skin entry site was selected. Operator donned sterile gloves and mask.   Site was marked, prepped with Betadine, draped in usual sterile fashion, infiltrated locally with 1% lidocaine.  Intravenous Fentanyl and Versed were administered as conscious sedation during continuous cardiorespiratory monitoring by the radiology RN, with a total moderate sedation time of 10 minutes.  Under real time ultrasound guidance, an 18 gauge needle was advanced into the perihepatic collection. Ultrasound documentation was stored . Cloudy thin fluid spontaneously returned through the needle hub.  An Amplatz guide wire advanced easily into the collection, confirmed on ultrasound.The tract was dilated to facilitate placement of a 10.2 French pigtail drain within the collection.  Contrast injection confirms appropriate positioning. No communication to the gallbladder or biliary tree is identified. 20 ml were aspirated, sent for Gram stain, culture and sensitivity. Catheter secured externally with O-Prolene suture and placed to  gravity bag. The patient tolerated the procedure well. No immediate complication.  IMPRESSION: Technically successful abscess drainage catheter placement into perihepatic collection.  Original Report Authenticated By: Osa Craver, M.D.   Ir US Guide Bx Asp/drain  01/31/2012  *RADIOLOGY REPORT*  Clinical Data: Cholecystitis, status post cholecystostomy tube placement.  The tube was inadvertently withdrawn.  Pain.  Recent CT demonstrates perihepatic loculated fluid possibly myeloma or  abscess.  ABSCESS DRAINAGE,IR ULTRASOUND GUIDANCE TISSUE ABLATION  The purpose, procedure, and risks have been discussed with the patient and all  questions were answered. Thereafter, the patient consented to the proposed procedure.,  Comparison: CT 01/29/2012  Operator: Archer Asa Assist:  Deanne Coffer  Technique and findings:  The procedure, risks (including but not limited to bleeding, infection, organ damage), benefits, and alternatives were explained to the patient.  Questions regarding the procedure were encouraged and answered.  The patient understands and consents to the procedure. Survey ultrasound of the right upper abdomen was   performed and the perihepatic collection was localized.  An appropriate skin entry site was selected. Operator donned sterile gloves and mask.   Site was marked, prepped with Betadine, draped in usual sterile fashion, infiltrated locally with 1% lidocaine.  Intravenous Fentanyl and Versed were administered as conscious sedation during continuous cardiorespiratory monitoring by the radiology RN, with a total moderate sedation time of 10 minutes.  Under real time ultrasound guidance, an 18 gauge needle was advanced into the perihepatic collection. Ultrasound documentation was stored . Cloudy thin fluid spontaneously returned through the needle hub.  An Amplatz guide wire advanced easily into the collection, confirmed on ultrasound.The tract was dilated to facilitate placement of a 10.2 French pigtail drain within the collection.  Contrast injection confirms appropriate positioning. No communication to the gallbladder or biliary tree is identified. 20 ml were aspirated, sent for Gram stain, culture and sensitivity. Catheter secured externally with O-Prolene suture and placed to gravity bag. The patient tolerated the procedure well. No immediate complication.  IMPRESSION: Technically successful abscess drainage catheter placement into perihepatic collection.  Original Report Authenticated  By: Osa Craver, M.D.    Medications / Allergies: per chart  Antibiotics: Anti-infectives     Start     Dose/Rate Route Frequency Ordered Stop   02/01/12 1544   gentamycin 80 mg in 0.9% normal saline 250 mL irrigation  Status:  Discontinued          As needed 02/01/12 1545 02/01/12 1621   02/01/12 1515   clindamycin (CLEOCIN) 1,200 mg, gentamicin (GARAMYCIN) 480 mg in sodium chloride irrigation 0.9 % 1,000 mL for intraperitoneal lavage  Status:  Discontinued         Intraperitoneal To Surgery 02/01/12 1508 02/01/12 1544   02/01/12 1000   Ampicillin-Sulbactam (UNASYN) 3 g in sodium chloride 0.9 % 100 mL IVPB        3 g 100 mL/hr over 60 Minutes Intravenous Every 6 hours 02/01/12 0901     01/30/12 0400   vancomycin (VANCOCIN) IVPB 1000 mg/200 mL premix  Status:  Discontinued        1,000 mg 200 mL/hr over 60 Minutes Intravenous Every 12 hours 01/29/12 1740 02/01/12 0901   01/29/12 2359   piperacillin-tazobactam (ZOSYN) IVPB 3.375 g  Status:  Discontinued        3.375 g 12.5 mL/hr over 240 Minutes Intravenous 3 times per day 01/29/12 1740 02/01/12 0901   01/29/12 1515   vancomycin (VANCOCIN) IVPB 1000 mg/200 mL premix  1,000 mg 200 mL/hr over 60 Minutes Intravenous  Once 01/29/12 1506 01/29/12 1735   01/29/12 1515  piperacillin-tazobactam (ZOSYN) IVPB 3.375 g       3.375 g 100 mL/hr over 30 Minutes Intravenous  Once 01/29/12 1506 01/29/12 1639

## 2012-02-02 NOTE — Progress Notes (Signed)
[facility] PROGRESS NOTE  Jose Crawford:[id number] DOB: November 12, 1925 DOA: 01/29/2012 PCP: Georgann Housekeeper, MD  Brief narrative:  78 y.o. male with a PMH of thymoma status post thoracotomy on 01/13/12 and cholecystitis treated with a cholecystostomy tube as well as advanced dementia who was sent to the hospital from his SNF with fever that began after pulling out his cholecystostomy tube. Patient is now status post cholecystectomy, POD #1.  Assessment/Plan:   Principal Problem:  *Fever and leukocytosis secondary to recurrent cholecystitis / Perihepatic abscess RUQ  Admitted and a diagnostic evaluation confirmed recurrent cholecystitis as the probable source of fever.  Placed on empiric Vancomycin and Zosyn, narrowed to Unasyn 02/01/12. WBC normalized. Fever resolved.  Discussed case with [name] on 01/29/12, who saw him 01/30/12 and with [name] of IR.  Underwent percutaneous drainage of fluid collection per IR & now status post cholecystectomy POD #1  Active Problems:  Urinary Retention  Bladder scan done with 600 cc residual noted, foley placed.  Will need voiding trial prior to discharge. Constipation  Give dulcolax suppository today.  Start daily MiraLax and dulcolax PR PRN. Dementia  Advanced. Disoriented, anxious.  Return to SNF when stable. Thymoma  Status post resection/thoracotomy. No evidence of post operative wound infection.  F/U with [name] after discharge. Hyponatremia  Maybe SIADH from malignancy versus dehydration.  Improving with IVF. Hypercalcemia likely secondary to hyperparathyroidism  Calcium initially normalized with IVF.  A review of the patient's records indicate elevated PTH levels (153.4 on 12/05/11), no elevation of PTH related peptide, and a TSH of 1.029.  Started Sensipar, may be secondary to thymoma associated hyperparathyroidism. Monitor calcium levels closely. Oncology follow up. Normocytic anemia  Slight drop in hemoglobin likely  dilutional, with underlying AOCD contributory.  Code Status: DNR  Family Communication: Spoke with son on phone 01/31/12. Jose Crawford [phone]). Dr. [name] updated him regarding need for cholecystectomy 01/31/12.  Disposition Plan: SNF when stable. Perhaps in next 24 hours  Medical Consultants:  [name] and Dr. [name], Surgery  [name], Interventional Radiology Other consultants:  Physical therapy  Occupational therapy Antibiotics:  Vancomycin 01/29/12--->02/01/12  Zosyn 01/29/12--->02/01/12  Unasyn 02/01/12--->  [name], MD  [facility] Pager [phone]  If 7PM-7AM, please contact night-coverage [email or web address] Password [facility] 02/02/2012, 5:58 PM   LOS: 4 days   HPI/Subjective: No acute events overnight.  Objective: Filed Vitals:   02/01/12 2022 02/02/12 0052 02/02/12 0514 02/02/12 1356  BP: 137/84 124/74 125/69 146/73  Pulse: 93 95 97 91  Temp: 97.8 F (36.6 C) 98 F (36.7 C) 99.1 F (37.3 C) 98.6 F (37 C)  TempSrc: Oral Oral Oral Axillary  Resp: 15 19 18 16   Height:      Weight:      SpO2: 93% 91% 92% 88%    Intake/Output Summary (Last 24 hours) at 02/02/12 1758 Last data filed at 02/02/12 1515  Gross per 24 hour  Intake   1758 ml  Output   2275 ml  Net   -517 ml    Exam:   General:  Pt is alert, not in acute distress  Cardiovascular: Regular rate and rhythm, S1/S2, no murmurs, no rubs, no gallops  Respiratory: Clear to auscultation bilaterally, no wheezing, no crackles, no rhonchi  Abdomen: Soft, non tender, non distended, bowel sounds present, no guarding  Extremities: No edema, pulses DP and PT palpable bilaterally  Neuro: Grossly nonfocal  Data Reviewed: Basic Metabolic Panel:  Lab 02/02/12 8295 02/01/12 6213 01/30/12 0429 01/29/12 1422  NA 133* 132* 129* 131*  K 3.5 3.5 3.7 3.8  CL 99 100 99 98  CO2 27 25 23 23   GLUCOSE 103* 100* 135* 131*  BUN 6 13 16 16   CREATININE 0.74 0.94 0.89 0.89  CALCIUM 10.9* 10.8* 10.3 11.4*    Liver Function Tests:  Lab 01/29/12 1422  AST 15  ALT 11  ALKPHOS 63  BILITOT 1.1  PROT 6.3  ALBUMIN 3.3*   CBC:  Lab 02/02/12 0405 02/01/12 0338 01/30/12 0429 01/29/12 1422  WBC 10.0 7.8 22.0* 28.8*  HGB 11.2* 10.2* 10.9* 12.9*  HCT 34.7* 31.5* 32.5* 38.5*  MCV 91.6 92.6 91.3 91.9  PLT 390 341 349 403*    Recent Results (from the past 240 hour(s))  CULTURE, BLOOD (ROUTINE X 2)     Status: Normal (Preliminary result)   Collection Time   01/29/12  2:22 PM      Component Value Range Status Comment   Specimen Description BLOOD LEFT IV SITE   Final    Special Requests BOTTLES DRAWN AEROBIC AND ANAEROBIC 5 CC EACH   Final    Culture  Setup Time 01/29/2012 18:35   Final    Culture     Final    Value:        BLOOD CULTURE RECEIVED NO GROWTH TO DATE CULTURE WILL BE HELD FOR 5 DAYS BEFORE ISSUING A FINAL NEGATIVE REPORT   Report Status PENDING   Incomplete   URINE CULTURE     Status: Normal   Collection Time   01/29/12  2:44 PM      Component Value Range Status Comment   Specimen Description URINE, CATHETERIZED   Final    Special Requests NONE   Final    Culture  Setup Time 01/29/2012 18:34   Final    Colony Count 2,000 COLONIES/ML   Final    Culture ESCHERICHIA COLI   Final    Report Status 01/31/2012 FINAL   Final    Organism ID, Bacteria ESCHERICHIA COLI   Final   CULTURE, BLOOD (ROUTINE X 2)     Status: Normal (Preliminary result)   Collection Time   01/29/12  3:26 PM      Component Value Range Status Comment   Specimen Description BLOOD RIGHT FOREARM   Final    Special Requests BOTTLES DRAWN AEROBIC ONLY 1.5 CC   Final    Culture  Setup Time 01/29/2012 18:35   Final    Culture     Final    Value:        BLOOD CULTURE RECEIVED NO GROWTH TO DATE CULTURE WILL BE HELD FOR 5 DAYS BEFORE ISSUING A FINAL NEGATIVE REPORT   Report Status PENDING   Incomplete   MRSA PCR SCREENING     Status: Normal   Collection Time   01/30/12 10:35 AM      Component Value Range Status Comment    MRSA by PCR NEGATIVE  NEGATIVE Final   CULTURE, ROUTINE-ABSCESS     Status: Normal (Preliminary result)   Collection Time   01/31/12  1:24 PM      Component Value Range Status Comment   Specimen Description PERITONEAL CAVITY   Final    Special Requests Normal   Final    Gram Stain     Final    Value: MODERATE WBC PRESENT,BOTH PMN AND MONONUCLEAR     NO SQUAMOUS EPITHELIAL CELLS SEEN     RARE GRAM NEGATIVE RODS   Culture MODERATE ESCHERICHIA  COLI   Final    Report Status PENDING   Incomplete   ANAEROBIC CULTURE     Status: Normal (Preliminary result)   Collection Time   01/31/12  1:24 PM      Component Value Range Status Comment   Specimen Description PERITONEAL CAVITY   Final    Special Requests Normal   Final    Gram Stain     Final    Value: ABUNDANT WBC PRESENT, PREDOMINANTLY PMN     NO SQUAMOUS EPITHELIAL CELLS SEEN     RARE GRAM NEGATIVE RODS   Culture     Final    Value: NO ANAEROBES ISOLATED; CULTURE IN PROGRESS FOR 5 DAYS   Report Status PENDING   Incomplete   SURGICAL PCR SCREEN     Status: Abnormal   Collection Time   02/01/12 12:29 PM      Component Value Range Status Comment   MRSA, PCR NEGATIVE  NEGATIVE Final    Staphylococcus aureus POSITIVE (*) NEGATIVE Final      Studies: Dg Cholangiogram Operative 02/01/2012   IMPRESSION  1.  Persistent small filling defects in the distal CBD suggesting retained nonocclusive calculi.  .    Scheduled Meds:   . ampicillin-sulbactam (UNASYN) IV  3 g Intravenous Q6H  . cinacalcet  30 mg Oral BID WC  . HYDROmorphone      . HYDROmorphone      . lip balm  1 application Topical BID  . LORazepam  1 mg Intravenous Once  . nystatin cream  1 application Topical BID  . polyethylene glycol  17 g Oral Daily  . saccharomyces boulardii  250 mg Oral BID   Continuous Infusions:   . sodium chloride 50 mL/hr at 02/02/12 0700

## 2012-02-03 ENCOUNTER — Other Ambulatory Visit: Payer: Self-pay | Admitting: Surgery

## 2012-02-03 DIAGNOSIS — E871 Hypo-osmolality and hyponatremia: Secondary | ICD-10-CM

## 2012-02-03 DIAGNOSIS — K8000 Calculus of gallbladder with acute cholecystitis without obstruction: Principal | ICD-10-CM

## 2012-02-03 DIAGNOSIS — D384 Neoplasm of uncertain behavior of thymus: Secondary | ICD-10-CM

## 2012-02-03 DIAGNOSIS — R222 Localized swelling, mass and lump, trunk: Secondary | ICD-10-CM

## 2012-02-03 LAB — CULTURE, ROUTINE-ABSCESS

## 2012-02-03 MED ORDER — MORPHINE SULFATE 2 MG/ML IJ SOLN
1.0000 mg | INTRAMUSCULAR | Status: DC | PRN
  Administered 2012-02-03 – 2012-02-04 (×3): 1 mg via INTRAVENOUS
  Filled 2012-02-03 (×4): qty 1

## 2012-02-03 MED ORDER — LORAZEPAM 2 MG/ML IJ SOLN
INTRAMUSCULAR | Status: AC
  Administered 2012-02-03: 1 mg
  Filled 2012-02-03: qty 1

## 2012-02-03 MED ORDER — LORAZEPAM 2 MG/ML IJ SOLN
1.0000 mg | Freq: Three times a day (TID) | INTRAMUSCULAR | Status: DC | PRN
Start: 2012-02-03 — End: 2012-02-04
  Administered 2012-02-04: 1 mg via INTRAVENOUS
  Filled 2012-02-03 (×2): qty 1

## 2012-02-03 MED ORDER — ENSURE COMPLETE PO LIQD
237.0000 mL | Freq: Two times a day (BID) | ORAL | Status: DC
  Administered 2012-02-04: 237 mL via ORAL

## 2012-02-03 NOTE — Progress Notes (Signed)
INITIAL ADULT NUTRITION ASSESSMENT Date: 02/03/2012   Time: 5:27 PM Reason for Assessment: Low braden  INTERVENTION: Ensure Complete BID. Bowel regimen per MD as no BM charted so far this admission. Recommend scheduling anti-emetics to help with pt's nausea. Will monitor.   ASSESSMENT: Male 78 y.o.  Dx: Acute calculous cholecystitis  Food/Nutrition Related Hx: Pt appears thin. Noted pt's intake during admission has been variable, from 0-70% meal intake. Noted pt has c/o nausea today and earlier in the week. Pt with dementia, however was able to state that he thinks he lost 20 pounds recently, unsure of time frame. Past records indicate pt has lost 15 pounds unintentionally in the past 2 months. Pt agreeable to trying nutritional supplements. POD# 2 cholecystectomy with intraoperative cholangiogram.   Hx:  Past Medical History  Diagnosis Date  . Thymoma     s/p resection  . Acute cholecystitis 11/2011    s/p perc drain 12/03/11  . Basal cell carcinoma     multiple areas  . Prostate cancer   . Stroke   . Arthritis   . Depression   . Dementia     Short term  . Head injury     Concussion- has had short term memory and it deterates .  Marland Kitchen Cholecystitis     S/P cholecytostomy tube  . Dementia   . Stroke   . Hyperparathyroidism    Related Meds:  Scheduled Meds:   . ampicillin-sulbactam (UNASYN) IV  3 g Intravenous Q6H  . cinacalcet  30 mg Oral BID WC  . lip balm  1 application Topical BID  . nystatin cream  1 application Topical BID  . polyethylene glycol  17 g Oral Daily  . saccharomyces boulardii  250 mg Oral BID   Continuous Infusions:   . sodium chloride 50 mL/hr at 02/02/12 2118   PRN Meds:.acetaminophen, acetaminophen, alum & mag hydroxide-simeth, alum & mag hydroxide-simeth, bisacodyl, magic mouthwash, menthol-cetylpyridinium, morphine injection, ondansetron (ZOFRAN) IV, ondansetron, phenol, traMADol, zolpidem, DISCONTD:  HYDROmorphone (DILAUDID) injection  Ht: 6\' 1"   (185.4 cm)  Wt: 161 lb 2.5 oz (73.1 kg)  Ideal Wt: 184 lb % Ideal Wt: 87  Usual Wt: 168-176 lb % Usual Wt: 91-96  Wt Readings from Last 10 Encounters:  01/29/12 161 lb 2.5 oz (73.1 kg)  01/29/12 161 lb 2.5 oz (73.1 kg)  01/20/12 170 lb 13.7 oz (77.5 kg)  01/20/12 170 lb 13.7 oz (77.5 kg)  01/12/12 168 lb 3 oz (76.289 kg)  12/29/11 170 lb (77.111 kg)  12/24/11 168 lb (76.204 kg)  12/21/11 168 lb 11.2 oz (76.522 kg)  12/07/11 176 lb 1.6 oz (79.878 kg)    Body mass index is 21.26 kg/(m^2).   Labs:  CMP     Component Value Date/Time   NA 133* 02/02/2012 0405   K 3.5 02/02/2012 0405   CL 99 02/02/2012 0405   CO2 27 02/02/2012 0405   GLUCOSE 103* 02/02/2012 0405   BUN 6 02/02/2012 0405   CREATININE 0.74 02/02/2012 0405   CALCIUM 10.9* 02/02/2012 0405   PROT 6.3 01/29/2012 1422   ALBUMIN 3.3* 01/29/2012 1422   AST 15 01/29/2012 1422   ALT 11 01/29/2012 1422   ALKPHOS 63 01/29/2012 1422   BILITOT 1.1 01/29/2012 1422   GFRNONAA 82* 02/02/2012 0405   GFRAA >90 02/02/2012 0405    Intake/Output Summary (Last 24 hours) at 02/03/12 1732 Last data filed at 02/03/12 1538  Gross per 24 hour  Intake 2707.33 ml  Output  2260 ml  Net 447.33 ml   JP drain - 10ml output so far today Last BM - PTA  Diet Order: Parke Simmers   IVF:    sodium chloride Last Rate: 50 mL/hr at 02/02/12 2118    Estimated Nutritional Needs:   Kcal:2000-2300 Protein:110-120g Fluid:2-2.3L  NUTRITION DIAGNOSIS: -Inadequate oral intake (NI-2.1).  Status: Ongoing -Pt meets criteria for severe PCM of chronic illness AEB 8.5% weight loss in the past 2 months with likely <75% estimated energy intake for at least the past month in addition to pt with mild to moderate muscle and fat loss in upper extremities and decreased strength per PT notes   RELATED TO: dementia and nausea   AS EVIDENCE BY: 0-70% meal intake, weight loss PTA  MONITORING/EVALUATION(Goals): Pt to consume >90% of meals/supplements.   EDUCATION  NEEDS: -Education not appropriate at this time  Dietitian #: [phone]  DOCUMENTATION CODES Per approved criteria  -Severe malnutrition in the context of chronic illness    Marshall Cork 02/03/2012, 5:27 PM

## 2012-02-03 NOTE — Progress Notes (Signed)
2 Days Post-Op  Subjective: Finishing lunch says he feels bad, complaining of drain site RUQ, and some nausea.  Still very confused.   Objective: Vital signs in last 24 hours: Temp:  [98.1 F (36.7 C)-98.6 F (37 C)] 98.1 F (36.7 C) (07/11 0624) Pulse Rate:  [81-91] 81  (07/11 0624) Resp:  [16-18] 17  (07/11 0624) BP: (146-159)/(73-91) 148/80 mmHg (07/11 0624) SpO2:  [88 %-92 %] 91 % (07/11 0624) Last BM Date: 01/19/12 (pta)  240 ml PO recorded, 25 thru the drain.  Afebrile, VSS, BP up some.Labs OK  Intake/Output from previous day: 07/10 0701 - 07/11 0700 In: 2072.3 [P.O.:240; I.V.:1432.3; IV Piggyback:400] Out: 1825 [Urine:1800; Drains:25] Intake/Output this shift: Total I/O In: 180 [P.O.:180] Out: -   General appearance: alert, cooperative, confused, and says he feels bad. GI: soft, tender, drain is serosanguineous drainage which is  Clear.  Lab Results:   Basename 02/02/12 0405 02/01/12 0338  WBC 10.0 7.8  HGB 11.2* 10.2*  HCT 34.7* 31.5*  PLT 390 341    BMET  Basename 02/02/12 0405 02/01/12 0338  NA 133* 132*  K 3.5 3.5  CL 99 100  CO2 27 25  GLUCOSE 103* 100*  BUN 6 13  CREATININE 0.74 0.94  CALCIUM 10.9* 10.8*   PT/INR No results found for this basename: LABPROT:2,INR:2 in the last 72 hours   Lab 01/29/12 1422  AST 15  ALT 11  ALKPHOS 63  BILITOT 1.1  PROT 6.3  ALBUMIN 3.3*     Lipase     Component Value Date/Time   LIPASE 13 12/02/2011 1300     Studies/Results: Dg Cholangiogram Operative  02/01/2012  *RADIOLOGY REPORT*  Clinical Data:   Cholelithiasis  INTRAOPERATIVE CHOLANGIOGRAM  Technique:  Cholangiographic images from the C-arm fluoroscopic device were submitted for interpretation post-operatively.  Please see the procedural report for the amount of contrast and the fluoroscopy time utilized.  Comparison:  None  Findings: There are 2 small nonocclusive persistent filling defects in the distal common duct. Intrahepatic ducts are  incompletely visualized, appearing decompressed centrally. Contrast passes into the duodenum. A small amount contrast refluxes into the pancreatic duct.  IMPRESSION  1.  Persistent small filling defects in the distal CBD suggesting retained nonocclusive calculi.  Original Report Authenticated By: Osa Craver, M.D.    Medications:    . ampicillin-sulbactam (UNASYN) IV  3 g Intravenous Q6H  . cinacalcet  30 mg Oral BID WC  . lip balm  1 application Topical BID  . NONFORMULARY OR COMPOUNDED ITEM 1 each  1 each Irrigation To OR  . nystatin cream  1 application Topical BID  . polyethylene glycol  17 g Oral Daily  . saccharomyces boulardii  250 mg Oral BID    Assessment/Plan LAPAROSCOPIC CHOLECYSTECTOMY WITH INTRAOPERATIVE CHOLANGIOGRAM 02/01/12 Dr. Michaell Crawford. Thymoma Status post resection/thoracotomy. No evidence of post operative wound infection Dementia  Hypercalcemia likely secondary to hyperparathyroidism    Plan:  I would encourage the tylenol be give regularly, I think he has a history of worsening confusion with narcotics.  Will defer to medicine.  He can go home when medically ready.  Dr.Gross plans to get drain out in office next week.    LOS: 5 days    Annelyse Rey 02/03/2012

## 2012-02-03 NOTE — Progress Notes (Signed)
D/w IM team - OK to D/C & f/u for drain removal next week

## 2012-02-03 NOTE — Progress Notes (Signed)
[facility] PROGRESS NOTE  MASYN ROSTRO AVW:[id number] DOB: 01/22/1926 DOA: 01/29/2012 PCP: Georgann Housekeeper, MD  Brief narrative:  78 y.o. male with a PMH of thymoma status post thoracotomy on 01/13/12 and cholecystitis treated with a cholecystostomy tube as well as advanced dementia who was sent to the hospital from his SNF with fever that began after pulling out his cholecystostomy tube. Patient is now status post cholecystectomy, POD #2.   Assessment/Plan:   Principal Problem:  *Fever and leukocytosis secondary to recurrent cholecystitis / Perihepatic abscess RUQ  Admitted and a diagnostic evaluation confirmed recurrent cholecystitis as the probable source of fever.  Placed on empiric Vancomycin and Zosyn, narrowed to Unasyn 02/01/12. WBC normalized. Fever resolved.  Discussed case with [name] on 01/29/12, who saw him 01/30/12 and with [name] of IR.  Underwent percutaneous drainage of fluid collection per IR & now status post cholecystectomy POD #2  Active Problems:  Urinary Retention  Bladder scan done with 600 cc residual noted, foley placed.  Will need voiding trial prior to discharge. Constipation  Give dulcolax suppository today.  Start daily MiraLax and dulcolax PR PRN. Dementia  Advanced. Disoriented, anxious.  Return to SNF when stable. Thymoma  Status post resection/thoracotomy. No evidence of post operative wound infection.  F/U with [name] after discharge. Hyponatremia  Maybe SIADH from malignancy versus dehydration.  Improving with IVF. Sodium 133 Hypercalcemia likely secondary to hyperparathyroidism  Calcium initially normalized with IVF.  A review of the patient's records indicate elevated PTH levels (153.4 on 12/05/11), no elevation of PTH related peptide, and a TSH of 1.029.  Started Sensipar, may be secondary to thymoma associated hyperparathyroidism. Monitor calcium levels closely. Oncology follow up. Normocytic anemia  Slight drop in hemoglobin  likely dilutional, with underlying AOCD contributory. Hemoglobin 11.2  Code Status: DNR  Family Communication: Spoke with son on phone 01/31/12. [name] [phone]). Dr. [name] updated him regarding need for cholecystectomy 01/31/12.  Disposition Plan: SNF when stable. Perhaps in next 24 hours   Medical Consultants:  [name] and Dr. [name], Surgery  [name], Interventional Radiology Other consultants:  Physical therapy  Occupational therapy Antibiotics:  Vancomycin 01/29/12--->02/01/12  Zosyn 01/29/12--->02/01/12  Unasyn 02/01/12---> Will change Unasyn to Augmentin on discharge Procedures:  Cholecystectomy with cholangiogram 02/01/2012   [name], MD  [facility] Pager [phone]  If 7PM-7AM, please contact night-coverage [email or web address] Password [facility] 02/03/2012, 3:29 PM   LOS: 5 days   HPI/Subjective: No acute events overnight; still complaints of pain.  Objective: Filed Vitals:   02/02/12 1400 02/02/12 2037 02/03/12 0624 02/03/12 1424  BP:  159/91 148/80 146/80  Pulse:  82 81 78  Temp:  98.2 F (36.8 C) 98.1 F (36.7 C) 97.7 F (36.5 C)  TempSrc:  Oral Oral Oral  Resp:  18 17 16   Height:      Weight:      SpO2: 92% 92% 91% 97%    Intake/Output Summary (Last 24 hours) at 02/03/12 1529 Last data filed at 02/03/12 1517  Gross per 24 hour  Intake 2527.33 ml  Output   1110 ml  Net 1417.33 ml    Exam:   General:  Pt is alert but confused, not in acute distress  Cardiovascular: Regular rate and rhythm, S1/S2, no murmurs, no rubs, no gallops  Respiratory: Clear to auscultation bilaterally, no wheezing, no crackles, no rhonchi  Abdomen: Soft, non tender, non distended, bowel sounds present, no guarding  Extremities: No edema, pulses DP and PT palpable bilaterally  Neuro: Grossly nonfocal  Data Reviewed: Basic Metabolic Panel:  Lab 02/02/12 1610 02/01/12 0338 01/30/12 0429 01/29/12 1422  NA 133* 132* 129* 131*  K 3.5 3.5 3.7 3.8  CL 99  100 99 98  CO2 27 25 23 23   GLUCOSE 103* 100* 135* 131*  BUN 6 13 16 16   CALCIUM 10.9* 10.8* 10.3 11.4*   Liver Function Tests:  Lab 01/29/12 1422  AST 15  ALT 11  ALKPHOS 63  BILITOT 1.1  PROT 6.3  ALBUMIN 3.3*   CBC:  Lab 02/02/12 0405 02/01/12 0338 01/30/12 0429 01/29/12 1422  WBC 10.0 7.8 22.0* 28.8*  HGB 11.2* 10.2* 10.9* 12.9*  HCT 34.7* 31.5* 32.5* 38.5*  MCV 91.6 92.6 91.3 91.9  PLT 390 341 349 403*    CULTURE, BLOOD (ROUTINE X 2)     Status: Normal (Preliminary result)   Collection Time   01/29/12  2:22 PM      Component Value Range Status Comment   Specimen Description BLOOD LEFT IV SITE   Final    Special Requests BOTTLES DRAWN AEROBIC AND ANAEROBIC 5 CC EACH   Final    Culture  Setup Time 01/29/2012 18:35   Final    Culture     Final    Value:        BLOOD CULTURE RECEIVED NO GROWTH TO DATE CULTURE WILL BE HELD FOR 5 DAYS BEFORE ISSUING A FINAL NEGATIVE REPORT   Report Status PENDING   Incomplete   URINE CULTURE     Status: Normal   Collection Time   01/29/12  2:44 PM      Component Value Range Status Comment   Specimen Description URINE, CATH  Final    Special Requests NONE   Final    Culture  Setup Time 01/29/2012 18:34   Final    Colony Count 2,000 COLONIES/ML   Final    Culture ESCHERICHIA COLI   Final    Report Status 01/31/2012 FINAL   Final    Organism ID, Bacteria ESCHERICHIA COLI   Final   CULTURE, BLOOD (ROUTINE X 2)     Status: Normal (Preliminary result)   Collection Time   01/29/12  3:26 PM      Component Value Range Status Comment   Specimen Description BLOOD RIGHT FOREARM   Final    Special Requests BOTTLES DRAWN AEROBIC ONLY 1.5 CC   Final    Culture  Setup Time 01/29/2012 18:35   Final    Culture     Final    Value:        BLOOD CULTURE RECEIVED NO GROWTH TO DATE CULTURE WILL BE HELD FOR 5 DAYS BEFORE ISSUING A FINAL NEGATIVE REPORT   Report Status PENDING   Incomplete   MRSA PCR SCREENING     Status: Normal   Collection Time   01/30/12  10:35 AM      Component Value Range Status Comment   MRSA by PCR NEGATIVE  NEGATIVE Final   CULTURE, ROUTINE-ABSCESS     Status: Normal   Collection Time   01/31/12  1:24 PM      Component Value Range Status Comment   Specimen Description PERITONEAL CAVITY   Final    Special Requests Normal   Final    Gram Stain     Final    Value: MODERATE WBC PRESENT,BOTH PMN AND MONONUCLEAR     NO SQUAMOUS EPITHELIAL CELLS SEEN     RARE GRAM NEGATIVE RODS  Culture MODERATE ESCHERICHIA COLI   Final    Report Status 02/03/2012 FINAL   Final    Organism ID, Bacteria ESCHERICHIA COLI   Final   ANAEROBIC CULTURE     Status: Normal (Preliminary result)   Collection Time   01/31/12  1:24 PM      Component Value Range Status Comment   Specimen Description PERITONEAL CAVITY   Final    Special Requests Normal   Final    Gram Stain     Final    Value: ABUNDANT WBC PRESENT, PREDOMINANTLY PMN     NO SQUAMOUS EPITHELIAL CELLS SEEN     RARE GRAM NEGATIVE RODS   Culture     Final    Value: NO ANAEROBES ISOLATED; CULTURE IN PROGRESS FOR 5 DAYS   Report Status PENDING   Incomplete   SURGICAL PCR SCREEN     Status: Abnormal   Collection Time   02/01/12 12:29 PM      Component Value Range Status Comment   MRSA, PCR NEGATIVE  NEGATIVE Final    Staphylococcus aureus POSITIVE (*) NEGATIVE Final      Studies: Dg Cholangiogram Operative 02/01/2012  *  IMPRESSION  1.  Persistent small filling defects in the distal CBD suggesting retained nonocclusive calculi.     Scheduled Meds:   . ampicillin-sulbactam (UNASYN) IV  3 g Intravenous Q6H  . cinacalcet  30 mg Oral BID WC  . nystatin cream  1 application Topical BID  . polyethylene glycol  17 g Oral Daily  . saccharomyces boulardii  250 mg Oral BID   Continuous Infusions:   . sodium chloride 50 mL/hr at 02/02/12 2118

## 2012-02-04 LAB — BASIC METABOLIC PANEL
BUN: 9 mg/dL (ref 6–23)
CO2: 27 mEq/L (ref 19–32)
Glucose, Bld: 106 mg/dL — ABNORMAL HIGH (ref 70–99)
Potassium: 3.5 mEq/L (ref 3.5–5.1)
Sodium: 131 mEq/L — ABNORMAL LOW (ref 135–145)

## 2012-02-04 LAB — CULTURE, BLOOD (ROUTINE X 2): Culture: NO GROWTH

## 2012-02-04 LAB — CBC
HCT: 31.8 % — ABNORMAL LOW (ref 39.0–52.0)
Hemoglobin: 10.4 g/dL — ABNORMAL LOW (ref 13.0–17.0)
RBC: 3.54 MIL/uL — ABNORMAL LOW (ref 4.22–5.81)

## 2012-02-04 MED ORDER — CINACALCET HCL 30 MG PO TABS: 30.0000 mg | ORAL_TABLET | Freq: Two times a day (BID) | ORAL | Status: AC

## 2012-02-04 MED ORDER — BIOTENE DRY MOUTH MT LIQD
15.0000 mL | Freq: Two times a day (BID) | OROMUCOSAL | Status: DC
  Administered 2012-02-04: 15 mL via OROMUCOSAL

## 2012-02-04 MED ORDER — MORPHINE SULFATE ER 15 MG PO TBCR: 15.0000 mg | EXTENDED_RELEASE_TABLET | Freq: Two times a day (BID) | ORAL | Status: DC

## 2012-02-04 MED ORDER — TRAMADOL HCL 50 MG PO TABS: 50.0000 mg | ORAL_TABLET | Freq: Four times a day (QID) | ORAL | Status: DC | PRN

## 2012-02-04 MED ORDER — MAGIC MOUTHWASH: 15.0000 mL | Freq: Four times a day (QID) | ORAL | Status: DC | PRN

## 2012-02-04 MED ORDER — ENSURE COMPLETE PO LIQD: 237.0000 mL | Freq: Two times a day (BID) | ORAL | Status: DC

## 2012-02-04 MED ORDER — ALUM & MAG HYDROXIDE-SIMETH 200-200-20 MG/5ML PO SUSP: 30.0000 mL | Freq: Four times a day (QID) | ORAL | Status: AC | PRN

## 2012-02-04 MED ORDER — BIOTENE DRY MOUTH MT LIQD: 15.0000 mL | Freq: Two times a day (BID) | OROMUCOSAL | Status: DC

## 2012-02-04 MED ORDER — AMOXICILLIN-POT CLAVULANATE 875-125 MG PO TABS: 1.0000 | ORAL_TABLET | Freq: Two times a day (BID) | ORAL | Status: AC

## 2012-02-04 MED ORDER — BISACODYL 10 MG RE SUPP: 10.0000 mg | Freq: Every day | RECTAL | Status: AC | PRN

## 2012-02-04 MED ORDER — SACCHAROMYCES BOULARDII 250 MG PO CAPS: 250.0000 mg | ORAL_CAPSULE | Freq: Two times a day (BID) | ORAL | Status: AC

## 2012-02-04 MED ORDER — PHENOL 1.4 % MT LIQD: 2.0000 | OROMUCOSAL | Status: DC | PRN

## 2012-02-04 MED ORDER — HYDROCODONE-ACETAMINOPHEN 5-325 MG PO TABS: 1.0000 | ORAL_TABLET | Freq: Four times a day (QID) | ORAL | Status: AC | PRN

## 2012-02-04 NOTE — Progress Notes (Addendum)
CSW spoke with [facility] this morning, they have a bed available and will be ready to take the patient at 2:00pm today. CSW informed the family to complete paper work. The family agreed to taking care of the paper work and preparing for the discharge at 2:00pm. The patient will D/C today to [facility]. Plan transport via EMS. The Patient is aware and the family are agreeable to plans.  Kayleen Memos. Leighton Ruff [phone]    Kayleen Memos Leighton Ruff [phone]

## 2012-02-04 NOTE — Discharge Summary (Addendum)
Physician Discharge Summary  Jose Crawford WGN:[id number] DOB: 03/11/1926 DOA: 01/29/2012  PCP: Georgann Housekeeper, MD  Admit date: 01/29/2012 Discharge date: 02/04/2012  Discharge Condition: medically stable for discharge to SNF today  Diet recommendation: regular and as toelrated  History of present illness:   Brief narrative:  78 y.o. male with a PMH of thymoma status post thoracotomy on 01/13/12 and cholecystitis treated with a cholecystostomy tube as well as advanced dementia who was sent to the hospital from his SNF with fever that began after pulling out his cholecystostomy tube. Patient is now status post cholecystectomy, POD #3.  Assessment/Plan:   Principal Problem:  *Fever and leukocytosis secondary to recurrent cholecystitis / Perihepatic abscess RUQ  Admitted and a diagnostic evaluation confirmed recurrent cholecystitis as the probable source of fever.  Placed on empiric Vancomycin and Zosyn, narrowed to Unasyn 02/01/12. WBC normalized. Fever resolved.  Discussed case with [name] on 01/29/12, who saw him 01/30/12 and with [name] of IR.  Underwent percutaneous drainage of fluid collection per IR & now status post cholecystectomy POD #3 Patient is stable for discharge to SNF toady and will follow up with surgery in regards to removal of the drain In regards to pain control prescriptions provided for MS contin 15 mg BID SCH and norco PRN Augmentin on discharge for 14 days  Active Problems:  Urinary Retention  Bladder scan done with 600 cc residual noted, foley placed.  Will try to d/c foley prior to discharge but in unsuccessful patient will need to be discharged to SNF with foley Constipation  Give dulcolax suppository today.  Started daily MiraLax and dulcolax PR PRN. Dementia  Disoriented Thymoma  Status post resection/thoracotomy. No evidence of post operative wound infection.  F/U with [name] after discharge. Hyponatremia  Maybe SIADH from malignancy versus  dehydration.  Sodium 131 Hypercalcemia likely secondary to hyperparathyroidism  Calcium initially normalized with IVF.  A review of the patient's records indicate elevated PTH levels (153.4 on 12/05/11), no elevation of PTH related peptide, and a TSH of 1.029.  Started Sensipar, may be secondary to thymoma associated hyperparathyroidism. Monitor calcium levels closely. Oncology follow up. Normocytic anemia  Slight drop in hemoglobin likely dilutional, with underlying AOCD contributory.  Hemoglobin 11.2  Code Status: DNR  Family Communication: Spoke with son on phone 02/04/12. [name] [phone]), agrees with discharge plan Disposition Plan: SNF today  Medical Consultants:  [name] and Dr. [name], Surgery  [name], Interventional Radiology Other consultants:  Physical therapy  Occupational therapy Antibiotics:  Vancomycin 01/29/12--->02/01/12  Zosyn 01/29/12--->02/01/12  Unasyn 02/01/12---> 02/04/2012 Will change Unasyn to Augmentin on discharge: Augmentin 875 mg BID for 14 days  Procedures:  Cholecystectomy with cholangiogram 02/01/2012  Discharge Exam: Filed Vitals:   02/04/12 0549  BP: 150/82  Pulse: 76  Temp: 98.2 F (36.8 C)  Resp: 16   Filed Vitals:   02/03/12 0624 02/03/12 1424 02/03/12 2208 02/04/12 0549  BP: 148/80 146/80 149/78 150/82  Pulse: 81 78 82 76  Temp: 98.1 F (36.7 C) 97.7 F (36.5 C) 98 F (36.7 C) 98.2 F (36.8 C)  TempSrc: Oral Oral Oral Oral  Resp: 17 16 16 16   Height:      Weight:      SpO2: 91% 97% 95% 97%    General: Pt is alert, follows commands appropriately, not in acute distress Cardiovascular: Regular rate and rhythm, S1/S2 +, no murmurs, no rubs, no gallops Respiratory: Clear to auscultation bilaterally, no wheezing, no crackles, no rhonchi Abdominal: Soft,  non tender, non distended, bowel sounds +, no guarding; drain in pace Extremities: no edema, no cyanosis, pulses palpable bilaterally DP and PT Neuro: Grossly nonfocal  Discharge  Instructions  Discharge Orders    Future Appointments: Provider: Department: Dept Phone: Center:   02/07/2012 3:00 PM [facility] [phone] [facility]   02/24/2012 3:30 PM Atilano Ina, MD,FACS [facility] [phone] None     Future Orders Please Complete By Expires   Diet - low sodium heart healthy      Increase activity slowly      Discharge instructions      Comments:   Please follow up with surgery as per scheduled appointment to remove the drain   Call MD for:  persistant nausea and vomiting      Call MD for:  severe uncontrolled pain      Call MD for:  difficulty breathing, headache or visual disturbances      Call MD for:  persistant dizziness or light-headedness        Medication List  As of 02/04/2012  9:28 AM   TAKE these medications         acetaminophen 500 MG tablet   Commonly known as: TYLENOL   Take 1,000 mg by mouth every 6 (six) hours as needed. For pain      alum & mag hydroxide-simeth 200-200-20 MG/5ML suspension   Commonly known as: MAALOX/MYLANTA   Take 30 mLs by mouth every 6 (six) hours as needed for indigestion (dyspepsia).      amoxicillin-clavulanate 875-125 MG per tablet   Commonly known as: AUGMENTIN   Take 1 tablet by mouth 2 (two) times daily.      antiseptic oral rinse Liqd   15 mLs by Mouth Rinse route 2 (two) times daily.      bisacodyl 10 MG suppository   Commonly known as: DULCOLAX   Place 1 suppository (10 mg total) rectally daily as needed.      cinacalcet 30 MG tablet   Commonly known as: SENSIPAR   Take 1 tablet (30 mg total) by mouth 2 (two) times daily with a meal.      feeding supplement Liqd   Take 237 mLs by mouth 2 (two) times daily with breakfast and lunch.      HYDROcodone-acetaminophen 5-325 MG per tablet   Commonly known as: NORCO   Take 1 tablet by mouth every 6 (six) hours as needed for pain.      magic mouthwash Soln   Take 15 mLs by mouth 4 (four) times daily as needed (sore throat).       morphine 15 MG 12 hr tablet   Commonly known as: MS CONTIN   Take 1 tablet (15 mg total) by mouth 2 (two) times daily.      nystatin cream   Commonly known as: MYCOSTATIN   Apply 1 application topically 2 (two) times daily. To groin.      phenol 1.4 % Liqd   Commonly known as: CHLORASEPTIC   Use as directed 2 sprays in the mouth or throat as needed.      polyethylene glycol packet   Commonly known as: MIRALAX / GLYCOLAX   Take 17 g by mouth daily as needed. Constipation.      saccharomyces boulardii 250 MG capsule   Commonly known as: FLORASTOR   Take 1 capsule (250 mg total) by mouth 2 (two) times daily.      traMADol 50 MG tablet   Commonly  known as: ULTRAM   Take 1-2 tablets (50-100 mg total) by mouth every 6 (six) hours as needed for pain. Pain.      zolpidem 5 MG tablet   Commonly known as: AMBIEN   Take 5 mg by mouth at bedtime as needed. Sleep.           Follow-up Information    Follow up with GROSS,STEVEN C., MD. Schedule an appointment as soon as possible for a visit in 1 week. (Possible drain removal at office visit)    Contact information:   [facility] [address] [place] [address] [phone]       Follow up with Georgann Housekeeper, MD. Schedule an appointment as soon as possible for a visit in 2 weeks.   Contact information:   [address] [place] [address] [phone]           The results of significant diagnostics from this hospitalization (including imaging, microbiology, ancillary and laboratory) are listed below for reference.    Significant Diagnostic Studies: Dg Chest 2 View 01/29/2012  *RADIOLOGY REPORT*  Clinical Data: Chest pain.  Shortness of breath.  Recent thoracotomy for left-sided mediastinal mass.  CHEST - 2 VIEW  Comparison: Portable chest x-rays 01/17/2012 dating back to 01/13/2012.  Two-view chest x-ray 01/12/2012.  Findings: Post-surgical changes along the  left side the mediastinum post resection of the mediastinal mass.  Resolution of the left pneumothorax since the 01/17/2012 examination.  Small bilateral pleural effusions and associated mild passive atelectasis in the lower lobes.  Lungs otherwise clear.  Cardiac silhouette normal in size, unchanged.  Pulmonary vascularity normal.  Visualized bony thorax intact.  IMPRESSION: Resolution of the left pneumothorax since 01/17/2012.  Residual small bilateral pleural effusions and associated passive atelectasis in the lower lobes.  No acute cardiopulmonary disease otherwise.    Dg Chest 2 View 01/12/2012  *RADIOLOGY REPORT*  Clinical Data: 78 year old male status preop for resection of thymoma, left thoracotomy.  History of prostate cancer, stroke.  CHEST - 2 VIEW  Comparison: 12/07/2011 and earlier.  Findings: Large left mediastinal mass re-identified on the lateral view, difficult to delineate from the remaining mediastinal contours on the frontal view. Overall mediastinal contours are stable since 12/02/2011.  Larger lung volumes.  No pneumothorax, pulmonary edema, pleural effusion or acute pulmonary opacity. No acute osseous abnormality identified.  Visualized tracheal air column is within normal limits.  Upper abdominal drain or catheter partially visible on both views.  IMPRESSION: 1.  Radiographically stable left mediastinal mass. 2.  No new cardiopulmonary abnormality.    Dg Cholangiogram Operative 02/01/2012  *RADIOLOGY REPORT*  Clinical Data:   Cholelithiasis  INTRAOPERATIVE CHOLANGIOGRAM  Technique:  Cholangiographic images from the C-arm fluoroscopic device were submitted for interpretation post-operatively.  Please see the procedural report for the amount of contrast and the fluoroscopy time utilized.  Comparison:  None  Findings: There are 2 small nonocclusive persistent filling defects in the distal common duct. Intrahepatic ducts are incompletely visualized, appearing decompressed centrally. Contrast  passes into the duodenum. A small amount contrast refluxes into the pancreatic duct.  IMPRESSION  1.  Persistent small filling defects in the distal CBD suggesting retained nonocclusive calculi.    Ct Chest W Contrast 01/29/2012  *RADIOLOGY REPORT*  Clinical Data:  Right quadrant pain, recent thoracotomy for thymoma.  Evaluate for empyema. The patients has history of cholecystostomy tube which  the patient pulled out apparently.  CT CHEST AND ABDOMEN WITH CONTRAST  Technique:  Multidetector CT imaging of the chest and abdomen was performed following the standard protocol during bolus administration of intravenous contrast.  Contrast: OMNIPAQUE IOHEXOL 300 MG/ML  SOLN  Comparison:  CT scan 05/10/2013CT CHEST  Findings:  No axillary or supraclavicular lymphadenopathy.  There is postsurgical change along the right lateral chest wall with muscle edema related to recent thoracotomy.  No evidence of abscess.  There is interval resection of the large anterior mediastinal mass. There is mild bibasilar atelectasis.  No evidence of empyema or pulmonary abscess.  No pericardial fluid.  The ascending aorta is enlarged to the 46 x 47 mm unchanged from prior.  IMPRESSION:  1.  No evidence of empyema or pulmonary abscess following thymoma resection.  2.  Bibasilar atelectasis and small effusions. 3.  Stable ascending aortic aneurysm.  CT ABDOMEN  Findings:  There is thickening of the gallbladder wall.  The gallbladder is decompressed following placement of cholecystostomy tube.  Cholecystectomy tube has been removed.  There is a fluid collection along the right lateral hepatic lobe measuring 6.0 x 1.8 cm (image 63).  This could represent early abscess.  No focal hepatic lesion.  The pancreas, spleen, adrenal glands, and kidneys are stable.  There are bilateral nephrolithiasis without evidence obstruction.  The stomach and limited view of the small bowel and colon are unremarkable.  Abdominal aorta is normal. Review of  bone  windows demonstrates no aggressive osseous lesions.  IMPRESSION:  1.  New fluid collection along the right lateral hepatic lobe adjacent to gallbladder fossa but could represent an early abscess. This is in the course of the prior cholecystostomy tube. 2.  The gallbladder is decompressed and thick-walled.    Ct Abdomen W Contrast 01/29/2012  *RADIOLOGY REPORT*  Clinical Data:  Right quadrant pain, recent thoracotomy for thymoma.  Evaluate for empyema. The patients has history of cholecystostomy tube which  the patient pulled out apparently.  CT CHEST AND ABDOMEN WITH CONTRAST  Technique:  Multidetector CT imaging of the chest and abdomen was performed following the standard protocol during bolus administration of intravenous contrast.  Contrast: OMNIPAQUE IOHEXOL 300 MG/ML  SOLN  Comparison:  CT scan 05/10/2013CT CHEST  Findings:  No axillary or supraclavicular lymphadenopathy.  There is postsurgical change along the right lateral chest wall with muscle edema related to recent thoracotomy.  No evidence of abscess.  There is interval resection of the large anterior mediastinal mass. There is mild bibasilar atelectasis.  No evidence of empyema or pulmonary abscess.  No pericardial fluid.  The ascending aorta is enlarged to the 46 x 47 mm unchanged from prior.  IMPRESSION:  1.  No evidence of empyema or pulmonary abscess following thymoma resection.  2.  Bibasilar atelectasis and small effusions. 3.  Stable ascending aortic aneurysm.  CT ABDOMEN  Findings:  There is thickening of the gallbladder wall.  The gallbladder is decompressed following placement of cholecystostomy tube.  Cholecystectomy tube has been removed.  There is a fluid collection along the right lateral hepatic lobe measuring 6.0 x 1.8 cm (image 63).  This could represent early abscess.  No focal hepatic lesion.  The pancreas, spleen, adrenal glands, and kidneys are stable.  There are bilateral nephrolithiasis without evidence obstruction.  The  stomach and limited view of the small bowel and colon are unremarkable.  Abdominal aorta is normal. Review of  bone windows demonstrates no aggressive osseous lesions.  IMPRESSION:  1.  New fluid collection along the right lateral hepatic lobe  adjacent to gallbladder fossa but could represent an early abscess. This is in the course of the prior cholecystostomy tube. 2.  The gallbladder is decompressed and thick-walled.   Ir Catheter Tube Change 01/18/2012  *RADIOLOGY REPORT*  Clinical Data/Indication: CHECK CHOLECYSTOSTOMY TUBE.  IR CATHETER TUBE CHANGE  Fluoroscopy Time: 1.6 minutes.  Procedure: The procedure, risks, benefits, and alternatives were explained to the patient. Questions regarding the procedure were encouraged and answered. The patient understands and consents to the procedure.  The right upper quadrant was prepped with betadine in a sterile fashion, and a sterile drape was applied covering the operative field. A sterile gown and sterile gloves were used for the procedure.  Spot imaging over the right upper quadrant was performed.  Contrast was injected into the biliary drain.  Initial imaging demonstrates occlusion of the cystic duct.  A gallstone is seen in the neck of the gallbladder. Delayed imaging demonstrates subsequent filling of the cystic and common bile duct.  The area was prepped and draped in a sterile fashion and 1% lidocaine was utilized for anesthesia.  The malpositioned cholecystostomy drain was then cut and removed over a Benson wire for a Kumpe catheter.  The Kumpe catheter was negotiated over the [place] wire into the lumen of the gallbladder then exchanged for a 10-French pigtail drain this was coiled in the gallbladder.  It was looped and string fixed then sewn to the skin.  Contrast was injected.  Findings: Initial imaging demonstrates that the cholecystostomy tube is coiled adjacent to the gallbladder lumen.  Contrast does fill gallbladder but there is delayed filling of the  biliary tree compatible with poor filling of the cystic duct.  The drain was exchanged and the new drain is not coiled within the lumen of the gallbladder.  Complications: None.  IMPRESSION: There is delayed filling of the cystic and common bile ducts secondary to a gallstone in the neck.  An element of cystic duct obstruction is therefore present.  The drain was left in place.  The drain was malpositioned adjacent to the gallbladder.  It was exchanged for a new drain which was positioned within the lumen of the gallbladder.   Ir Era Skeen W Catheter Placement 01/31/2012  *RADIOLOGY REPORT*  Clinical Data: Cholecystitis, status post cholecystostomy tube placement.  The tube was inadvertently withdrawn.  Pain.  Recent CT demonstrates perihepatic loculated fluid possibly myeloma or abscess.  ABSCESS DRAINAGE,IR ULTRASOUND GUIDANCE TISSUE ABLATION  The purpose, procedure, and risks have been discussed with the patient and all  questions were answered. Thereafter, the patient consented to the proposed procedure.,  Comparison: CT 01/29/2012  Operator: Archer Asa Assist:  Deanne Coffer  Technique and findings:  The procedure, risks (including but not limited to bleeding, infection, organ damage), benefits, and alternatives were explained to the patient.  Questions regarding the procedure were encouraged and answered.  The patient understands and consents to the procedure. Survey ultrasound of the right upper abdomen was   performed and the perihepatic collection was localized.  An appropriate skin entry site was selected. Operator donned sterile gloves and mask.   Site was marked, prepped with Betadine, draped in usual sterile fashion, infiltrated locally with 1% lidocaine.  Intravenous Fentanyl and Versed were administered as conscious sedation during continuous cardiorespiratory monitoring by the radiology RN, with a total moderate sedation time of 10 minutes.  Under real time ultrasound guidance, an 18 gauge needle was  advanced into the perihepatic collection. Ultrasound documentation was stored . Cloudy thin fluid spontaneously returned  through the needle hub.  An Amplatz guide wire advanced easily into the collection, confirmed on ultrasound.The tract was dilated to facilitate placement of a 10.2 French pigtail drain within the collection.  Contrast injection confirms appropriate positioning. No communication to the gallbladder or biliary tree is identified. 20 ml were aspirated, sent for Gram stain, culture and sensitivity. Catheter secured externally with O-Prolene suture and placed to gravity bag. The patient tolerated the procedure well. No immediate complication.  IMPRESSION: Technically successful abscess drainage catheter placement into perihepatic collection.    Ir US Guide Bx Asp/drain 01/31/2012  *RADIOLOGY REPORT*  Clinical Data: Cholecystitis, status post cholecystostomy tube placement.  The tube was inadvertently withdrawn.  Pain.  Recent CT demonstrates perihepatic loculated fluid possibly myeloma or abscess.  ABSCESS DRAINAGE,IR ULTRASOUND GUIDANCE TISSUE ABLATION  The purpose, procedure, and risks have been discussed with the patient and all  questions were answered. Thereafter, the patient consented to the proposed procedure.,  Comparison: CT 01/29/2012  Operator: Archer Asa Assist:  Deanne Coffer  Technique and findings:  The procedure, risks (including but not limited to bleeding, infection, organ damage), benefits, and alternatives were explained to the patient.  Questions regarding the procedure were encouraged and answered.  The patient understands and consents to the procedure. Survey ultrasound of the right upper abdomen was   performed and the perihepatic collection was localized.  An appropriate skin entry site was selected. Operator donned sterile gloves and mask.   Site was marked, prepped with Betadine, draped in usual sterile fashion, infiltrated locally with 1% lidocaine.  Intravenous Fentanyl and  Versed were administered as conscious sedation during continuous cardiorespiratory monitoring by the radiology RN, with a total moderate sedation time of 10 minutes.  Under real time ultrasound guidance, an 18 gauge needle was advanced into the perihepatic collection. Ultrasound documentation was stored . Cloudy thin fluid spontaneously returned through the needle hub.  An Amplatz guide wire advanced easily into the collection, confirmed on ultrasound.The tract was dilated to facilitate placement of a 10.2 French pigtail drain within the collection.  Contrast injection confirms appropriate positioning. No communication to the gallbladder or biliary tree is identified. 20 ml were aspirated, sent for Gram stain, culture and sensitivity. Catheter secured externally with O-Prolene suture and placed to gravity bag. The patient tolerated the procedure well. No immediate complication.  IMPRESSION: Technically successful abscess drainage catheter placement into perihepatic collection.    Dg Chest Port 1 View 01/17/2012  *RADIOLOGY REPORT*  Clinical Data: Postoperative evaluation.  Chest tube.  PORTABLE CHEST - 1 VIEW  Comparison: Chest x-ray 01/16/2012.  Findings: New compared to yesterday's examination there has been interval removal of the previously noted left-sided chest tube.  At this time, there is a small left-sided pneumothorax (approximately 10% of the volume of the left hemithorax) noted surrounding the left upper lung, with a component in the inferior aspect of the left costophrenic sulcus as well.  There is a left-sided internal jugular central venous catheter with tip terminating in the proximal superior vena cava. There is architectural distortion in the left hemithorax and an appearance of left lateral pleural thickening and/or loculated pleural fluid.  The right lung is low volume, but otherwise unremarkable.  Heart size is within normal limits.  Mediastinal contours are distorted by patient's mild  rotation to the right.  Atherosclerotic calcifications are noted within the arch of the aorta.  Multiple surgical clips are seen overlying the left upper heart border, at site of recent mass resection.  IMPRESSION: 1.  Status post removal of left-sided chest tube with small left- sided pneumothorax (approximately 10% of the volume of the left hemithorax). 2.  Left lateral pleural thickening versus loculated pleural fluid. 3.  Additional postoperative changes, and support apparatus, as above.    Dg Chest Port 1 View 01/16/2012  *RADIOLOGY REPORT*  Clinical Data: Postop  PORTABLE CHEST - 1 VIEW  Comparison: Yesterday  Findings: Left chest tube and left internal jugular vein central venous catheter are stable.  Tiny left apical pneumothorax. Bibasilar atelectasis stable.  No right pneumothorax.  Upper normal heart size.  IMPRESSION: Tiny left apical pneumothorax.  Stable bibasilar atelectasis.  Original Report Authenticated By: Donavan Burnet, M.D.   Dg Chest Port 1 View 01/15/2012  *RADIOLOGY REPORT*  Clinical Data: Postop resection of mediastinal mass.  Left chest tubes in place.  PORTABLE CHEST - 1 VIEW 01/15/2012 0734 hours:  Comparison: Portable chest x-rays yesterday and 01/13/2012.  Findings: Two left chest tubes in place with no pneumothorax. Suboptimal inspiration.  Atelectasis in the lung bases, left greater than right, with improved aeration since yesterday.  No new pulmonary parenchymal abnormalities.  Cardiac silhouette normal in size.  Pulmonary vascularity normal.  Left jugular central venous catheter tip in the upper SVC.  IMPRESSION: Support apparatus satisfactory.  No pneumothorax.  Improved aeration in the lung bases with mild to moderate atelectasis persisting, left greater than right.  No new abnormalities.    Dg Chest Port 1 View 01/14/2012  *RADIOLOGY REPORT*  Clinical Data: Chest tube.  Status post VATS.  PORTABLE CHEST - 1 VIEW  Comparison: Portable chest 01/13/2012.  Findings: Left-sided  chest tubes are stable position.  There is no significant pneumothorax.  A left IJ line is stable.  The lung volumes remain low.  Plate like atelectasis is noted at the right lung base.  There is dependent atelectasis at the left base.  A small effusion is not excluded.  IMPRESSION:  1.  Stable left-sided chest tubes without pneumothorax. 2.  Plate-like atelectasis at the right lung base. 3.  Dependent atelectasis and probable effusion at the left base.    Dg Chest Portable 1 View 01/13/2012  *RADIOLOGY REPORT*  Clinical Data: Postoperative radiograph  PORTABLE CHEST - 1 VIEW  Comparison: 01/12/2012  Findings: Postoperative changes with surgical clips along the left mediastinal / heart border. Left chest tube in place.  There is subcutaneous gas along the left hemithorax.  No definite pneumothorax.  Aortic arch atherosclerosis.  Cardiomegaly. Bibasilar opacities.  Small right pleural effusion not excluded. Left IJ catheter tip projects over the left brachiocephalic vein.  IMPRESSION: Interval resection of the mediastinal mass.  Left chest tube in place.  No significant pneumothorax.  Bibasilar opacities; atelectasis versus infiltrate.  Cardiomegaly.     Microbiology: CULTURE, BLOOD (ROUTINE X 2)     Status: Normal   Collection Time   01/29/12  2:22 PM      Component Value Range Status Comment   Culture NO GROWTH 5 DAYS   Final    Report Status 02/04/2012 FINAL   Final   URINE CULTURE     Status: Normal   Collection Time   01/29/12  2:44 PM      Component Value Range Status Comment   Specimen Description URINE, CATH  Final    Culture ESCHERICHIA COLI   Final    Report Status 01/31/2012 FINAL   Final    Organism ID, Bacteria ESCHERICHIA COLI   Final   CULTURE, BLOOD (ROUTINE X  2)     Status: Normal   Collection Time   01/29/12  3:26 PM      Component Value Range Status Comment   Culture NO GROWTH 5 DAYS   Final    Report Status 02/04/2012 FINAL   Final   CULTURE, ROUTINE-ABSCESS     Status: Normal     Collection Time   01/31/12  1:24 PM      Component Value Range Status Comment   Specimen Description PERITONEAL  Final    Culture MOD E. COLI   Final    Report Status 02/03/2012 FINAL   Final   ANAEROBIC CULTURE     Status: Normal (Preliminary result)   Collection Time   01/31/12  1:24 PM      Component Value Range Status Comment   Specimen Description PERITONEAL CAVITY   Final      RARE GRAM NEGATIVE RODS   Culture     Final    Value: NO ANAEROBES ISOLATED; CULTURE IN PROGRESS FOR 5 DAYS   Report Status PENDING   Incomplete   SURGICAL PCR SCREEN     Status: Abnormal   Collection Time   02/01/12 12:29 PM      Component Value Range Status Comment   MRSA, PCR NEGATIVE  NEGATIVE Final    Staphylococcus aureus POSITIVE (*) NEGATIVE Final      Labs: Basic Metabolic Panel:  Lab 02/04/12 4782 02/02/12 0405 02/01/12 0338 01/30/12 0429 01/29/12 1422  NA 131* 133* 132* 129* 131*  K 3.5 3.5 3.5 3.7 3.8  CL 97 99 100 99 98  CO2 27 27 25 23 23   GLUCOSE 106* 103* 100* 135* 131*  BUN 9 6 13 16 16   CREATININE 0.80 0.74 0.94 0.89 0.89  CALCIUM 9.8 10.9* 10.8* 10.3 11.4*   Liver Function Tests:  Lab 01/29/12 1422  AST 15  ALT 11  ALKPHOS 63  BILITOT 1.1  PROT 6.3  ALBUMIN 3.3*   CBC:  Lab 02/04/12 0417 02/02/12 0405 02/01/12 0338 01/30/12 0429 01/29/12 1422  WBC 7.8 10.0 7.8 22.0* 28.8*  HGB 10.4* 11.2* 10.2* 10.9* 12.9*  HCT 31.8* 34.7* 31.5* 32.5* 38.5*  MCV 89.8 91.6 92.6 91.3 91.9  PLT 381 390 341 349 403*   Time coordinating discharge: Over 30 minutes  Signed:  [name], MD  [facility] 02/04/2012, 9:28 AM  Pager #: [phone]

## 2012-02-04 NOTE — Progress Notes (Signed)
Report called to Tish,RN at [facility]. [facility] to pick pt up at 2pm today for transfer.

## 2012-02-04 NOTE — Progress Notes (Signed)
Occupational Therapy Treatment and goal update Patient Details Name: Jose Crawford MRN: [id number] DOB: Aug 21, 1925 Today's Date: 02/04/2012 Time: 9147-8295 OT Time Calculation (min): 24 min  OT Assessment / Plan / Recommendation Comments on Treatment Session did not do as well as when he was evaluated.  Goals revised today    Follow Up Recommendations  Skilled nursing facility    Barriers to Discharge       Equipment Recommendations  Defer to next venue (Simultaneous filing. User may not have seen previous data.)    Recommendations for Other Services    Frequency Min 1X/week   Plan Discharge plan remains appropriate    Precautions / Restrictions Precautions Precautions: Fall Precaution Comments: R side drain Restrictions Weight Bearing Restrictions: No   Pertinent Vitals/Pain Premedicated; had pain R side--not rated    ADL  Grooming: Performed;Wash/dry face;Other (comment) (bed level for face, did not continue at sink) Where Assessed - Grooming: Other (comment) (unable to stand without support, +2:  will modifiy) Toilet Transfer: Simulated;+2 Total assistance Toilet Transfer: Patient Percentage: 40% Toilet Transfer Method: Stand pivot (bed to chair) Transfers/Ambulation Related to ADLs: Pt needed much more assistance today.  Only performed SPT with PT ADL Comments: Pt was premedicated.     OT Diagnosis:    OT Problem List:   OT Treatment Interventions:     OT Goals Acute Rehab OT Goals Time For Goal Achievement: 02/18/12 Potential to Achieve Goals: Fair ADL Goals Pt Will Perform Grooming: with set-up;Sitting, edge of bed;Unsupported (3 tasks) ADL Goal: Grooming - Progress: Revised due to lack of progress Pt Will Transfer to Toilet: with mod assist;Stand pivot transfer;3-in-1 ADL Goal: Toilet Transfer - Progress: Revised due to lack of progress Pt Will Perform Toileting - Hygiene: with mod assist;Sit to stand from 3-in-1/toilet ADL Goal: Toileting - Hygiene -  Progress: Revised due to lack of progress  Visit Information  Last OT Received On: 02/04/12 Assistance Needed: +2 PT/OT Co-Evaluation/Treatment: Yes    Subjective Data      Prior Functioning       Cognition  Overall Cognitive Status: History of cognitive impairments - at baseline Arousal/Alertness: Awake/alert Orientation Level: Disoriented to;Place;Time;Situation Behavior During Session: [facility] for tasks performed    Mobility Bed Mobility Bed Mobility: Rolling Right;Right Sidelying to Sit Rolling Right: 1: +2 Total assist Rolling Right: Patient Percentage: 0% Right Sidelying to Sit: 1: +2 Total assist;HOB flat;With rails Right Sidelying to Sit: Patient Percentage: 30% Details for Bed Mobility Assistance: Assist for B LEs, hips and trunk to get into SL.  Also requires assist for LEs off of bed and for trunk to attain sitting position.  Provided manual and verbal cuing for hand placement on rails with pt able to assist some.   Transfers Sit to Stand: 1: +2 Total assist;From bed Sit to Stand: Patient Percentage: 40% Stand to Sit: 1: +2 Total assist;To chair/3-in-1 Stand to Sit: Patient Percentage: 40% Details for Transfer Assistance: Assist to rise and maintain upright posture.  Noted pt with posterior lean with manual cuing at glutes/hips for upright stance.     Exercises    Balance    End of Session OT - End of Session Activity Tolerance: Patient limited by fatigue;Patient limited by pain Patient left: in chair;with call bell/phone within reach Nurse Communication: Mobility status  GO     Caydyn Sprung 02/04/2012, 9:21 AM Marica Otter, OTR/L [phone] 02/04/2012

## 2012-02-04 NOTE — Progress Notes (Signed)
Physical Therapy Treatment Patient Details Name: Jose Crawford MRN: [id number] DOB: [birth year] Today's Date: 02/04/2012 Time: 4098-1191 PT Time Calculation (min): 26 min  PT Assessment / Plan / Recommendation Comments on Treatment Session  Pt very weak with increased pain.  Requires +2 assist for all mobility with max verbal and manual cuing for correct technique.     Follow Up Recommendations  Skilled nursing facility    Barriers to Discharge        Equipment Recommendations  Defer to next venue    Recommendations for Other Services    Frequency Min 2X/week   Plan Discharge plan remains appropriate    Precautions / Restrictions Precautions Precautions: Fall Precaution Comments: R side drain Restrictions Weight Bearing Restrictions: No   Pertinent Vitals/Pain 8/10    Mobility  Bed Mobility Bed Mobility: Rolling Right;Right Sidelying to Sit Rolling Right: 1: +2 Total assist Rolling Right: Patient Percentage: 0% Right Sidelying to Sit: 1: +2 Total assist;HOB flat;With rails Right Sidelying to Sit: Patient Percentage: 30% Details for Bed Mobility Assistance: Assist for B LEs, hips and trunk to get into SL.  Also requires assist for LEs off of bed and for trunk to attain sitting position.  Provided manual and verbal cuing for hand placement on rails with pt able to assist some.   Transfers Transfers: Sit to Stand;Stand to Sit Sit to Stand: 1: +2 Total assist;From bed Sit to Stand: Patient Percentage: 40% Stand to Sit: 1: +2 Total assist;To chair/3-in-1 Stand to Sit: Patient Percentage: 40% Details for Transfer Assistance: Assist to rise and maintain upright posture.  Noted pt with posterior lean with manual cuing at glutes/hips for upright stance.   Ambulation/Gait Ambulation/Gait Assistance: 1: +2 Total assist Ambulation/Gait: Patient Percentage: 40% Ambulation Distance (Feet): 4 Feet (from bed to chair) Assistive device: Rolling walker Ambulation/Gait Assistance  Details: Took some steps from bed to chair with +2 assist to maintain upright posture with manual facilitation at glutes/hips to have upright posture, weight shifting and LE advancement.  Verbal cues also provided for technqiue.  Gait Pattern: Step-to pattern    Exercises     PT Diagnosis:    PT Problem List:   PT Treatment Interventions:     PT Goals Acute Rehab PT Goals PT Goal Formulation: Patient unable to participate in goal setting Time For Goal Achievement: 02/15/12 Potential to Achieve Goals: Fair Pt will go Supine/Side to Sit: with supervision;with HOB 0 degrees PT Goal: Supine/Side to Sit - Progress: Progressing toward goal Pt will go Stand to Sit: with supervision PT Goal: Stand to Sit - Progress: Progressing toward goal Pt will Ambulate: 51 - 150 feet;with rolling walker;with min assist PT Goal: Ambulate - Progress: Progressing toward goal  Visit Information  Last PT Received On: 02/04/12 Assistance Needed: +2    Subjective Data  Subjective: Jose Crawford, I hurt everywhere Patient Stated Goal: pt not able   Cognition  Overall Cognitive Status: History of cognitive impairments - at baseline Arousal/Alertness: Awake/alert Orientation Level: Disoriented to;Place;Time;Situation Behavior During Session: [name] for tasks performed    Balance     End of Session PT - End of Session Activity Tolerance: Patient limited by pain Patient left: in chair;with call bell/phone within reach (rolling table under chair for safety, RN aware) Nurse Communication: Mobility status;Precautions   GP     Jose Crawford, Jose Crawford 02/04/2012, 9:18 AM

## 2012-02-05 LAB — ANAEROBIC CULTURE: Special Requests: NORMAL

## 2012-02-07 ENCOUNTER — Ambulatory Visit (INDEPENDENT_AMBULATORY_CARE_PROVIDER_SITE_OTHER): Payer: Medicare Other | Admitting: Surgery

## 2012-02-07 ENCOUNTER — Encounter (INDEPENDENT_AMBULATORY_CARE_PROVIDER_SITE_OTHER): Payer: Self-pay | Admitting: Surgery

## 2012-02-07 VITALS — BP 105/78 | HR 97 | Temp 99.1°F | Ht 73.5 in

## 2012-02-07 DIAGNOSIS — F0390 Unspecified dementia without behavioral disturbance: Secondary | ICD-10-CM

## 2012-02-07 DIAGNOSIS — IMO0002 Reserved for concepts with insufficient information to code with codable children: Secondary | ICD-10-CM

## 2012-02-07 DIAGNOSIS — K8050 Calculus of bile duct without cholangitis or cholecystitis without obstruction: Secondary | ICD-10-CM

## 2012-02-07 DIAGNOSIS — K651 Peritoneal abscess: Secondary | ICD-10-CM

## 2012-02-07 DIAGNOSIS — K8000 Calculus of gallbladder with acute cholecystitis without obstruction: Secondary | ICD-10-CM

## 2012-02-07 NOTE — Progress Notes (Signed)
Subjective:     Patient ID: Jose Crawford, male   DOB: [birth year], 78 y.o.   MRN: [id number]  HPI  Jose Crawford  07/07/1926 [id number]  Patient Care Team: Georgann Housekeeper, MD as PCP - General (Internal Medicine)  This patient is a 78 y.o.male who presents today for surgical evaluation.   Diagnosis recurrent cholecystitis with perihepatic liver abscess.  Procedure percutaneous drainage is repeated  Laparoscopic lysis lesions and a cholecystectomy 02/01/2012  Pathology: Acute cholecystitis and: A cystolithiasis.  Pansensitive Escherichia coli infection.  Patient comes today in a wheelchair.  Transportation brought him from the nursing facility.  He is a gentleman who's had cholecystitis.  He was percutaneously drained.  He does have moderate dementia and gets confused.  He is pulled drains out in the past.  He was admitted last week After pulling his drain out again.  He had developed a perihepatic fluid collection.  That was percutaneously drained.  Throughout Escherichia coli.  The abscess was away from the gallbladder.  I discussion with the patient as well his is son-in-law and daughter.  I talked to them on the phone.  I recommended cholecystectomy.  They agreed.  Discussed with Dr. Andrey Campanile whom agreed as well.  Therefore, I went ahead and proceeded with cholecystectomy last week.  Left a new drain in.  Output seems to have been low.  His appetite is down.  He still has some soreness.  He tends to hold on the drain and open it up.  Patient Active Problem List  Diagnosis  . Recent Acute calculous cholecystitis  . Lung mass  . Prostate cancer  . Dementia  . Nephrolithiasis without hydronephrosis  . Thymoma  . Fever and leukocytosis secondary to recurrent cholecystitis   . Hyponatremia  . Hypercalcemia secondary to hyperparathyroidism  . Normocytic anemia  . Abscess, RUQ perihepatic  . Constipation  . Choledocholithiasis, nonobstructive    Past Medical History  Diagnosis  Date  . Thymoma     s/p resection  . Acute cholecystitis 11/2011    s/p perc drain 12/03/11  . Basal cell carcinoma     multiple areas  . Prostate cancer   . Stroke   . Arthritis   . Depression   . Dementia     Short term  . Head injury     Concussion- has had short term memory and it deterates .  Marland Kitchen Cholecystitis     S/P cholecytostomy tube  . Dementia   . Stroke   . Hyperparathyroidism     Past Surgical History  Procedure Date  . Prostatectomy   . Colon surgery 1994    Removal of parts of colon  . Thoracotomy 01/13/2012    Procedure: THORACOTOMY MAJOR;  Surgeon: Alleen Borne, MD;  Location: [facility] OR;  Service: Thoracic;  Laterality: Left;  LEFT THORACOTOMY, RESECTION OF THYMOMA  . Cholecystectomy 02/01/2012    Procedure: LAPAROSCOPIC CHOLECYSTECTOMY WITH INTRAOPERATIVE CHOLANGIOGRAM;  Surgeon: Ardeth Sportsman, MD;  Location: [facility] ORS;  Service: General;  Laterality: N/A;  4 port    History   Social History  . Marital Status: Widowed    Spouse Name: N/A    Number of Children: 1  . Years of Education: N/A   Occupational History  . Retired Psychologist, sport and exercise, Airline pilot    Social History Main Topics  . Smoking status: Former Smoker    Types: Cigarettes    Quit date: 07/26/1968  . Smokeless tobacco: Never Used  . Alcohol Use: No  .  Drug Use: No  . Sexually Active: Not on file   Other Topics Concern  . Not on file   Social History Narrative   Widowed.  SNF ([facility]) resident.  Ambulates with a walker at baseline.  DNR.    Family History  Problem Relation Age of Onset  . Cancer Mother   . Aneurysm Mother     Current Outpatient Prescriptions  Medication Sig Dispense Refill  . acetaminophen (TYLENOL) 500 MG tablet Take 1,000 mg by mouth every 6 (six) hours as needed. For pain      . alum & mag hydroxide-simeth (MAALOX/MYLANTA) 200-200-20 MG/5ML suspension Take 30 mLs by mouth every 6 (six) hours as needed for indigestion (dyspepsia).  355 mL  0  . Alum & Mag  Hydroxide-Simeth (MAGIC MOUTHWASH) SOLN Take 15 mLs by mouth 4 (four) times daily as needed (sore throat).  15 mL  5  . amoxicillin-clavulanate (AUGMENTIN) 875-125 MG per tablet Take 1 tablet by mouth 2 (two) times daily.  28 tablet  0  . antiseptic oral rinse (BIOTENE) LIQD 15 mLs by Mouth Rinse route 2 (two) times daily.  1 Bottle  1  . bisacodyl (DULCOLAX) 10 MG suppository Place 1 suppository (10 mg total) rectally daily as needed.  30 suppository  2  . cinacalcet (SENSIPAR) 30 MG tablet Take 1 tablet (30 mg total) by mouth 2 (two) times daily with a meal.  60 tablet  0  . feeding supplement (ENSURE COMPLETE) LIQD Take 237 mLs by mouth 2 (two) times daily with breakfast and lunch.  1 Bottle  11  . HYDROcodone-acetaminophen (NORCO) 5-325 MG per tablet Take 1 tablet by mouth every 6 (six) hours as needed for pain.  30 tablet  0  . morphine (MS CONTIN) 15 MG 12 hr tablet Take 1 tablet (15 mg total) by mouth 2 (two) times daily.  60 tablet  0  . nystatin cream (MYCOSTATIN) Apply 1 application topically 2 (two) times daily. To groin.      . phenol (CHLORASEPTIC) 1.4 % LIQD Use as directed 2 sprays in the mouth or throat as needed.  118 mL  1  . polyethylene glycol (MIRALAX / GLYCOLAX) packet Take 17 g by mouth daily as needed. Constipation.      . saccharomyces boulardii (FLORASTOR) 250 MG capsule Take 1 capsule (250 mg total) by mouth 2 (two) times daily.  60 capsule  0  . traMADol (ULTRAM) 50 MG tablet Take 1-2 tablets (50-100 mg total) by mouth every 6 (six) hours as needed for pain. Pain.  30 tablet  0  . zolpidem (AMBIEN) 5 MG tablet Take 5 mg by mouth at bedtime as needed. Sleep.         Allergies  Allergen Reactions  . Other Other (See Comments)    All narcotics. Pt. Is hard to manage    BP 105/78  Pulse 97  Temp 99.1 F (37.3 C) (Temporal)  Ht 6' 1.5" (1.867 m)  Dg Chest 2 View  01/29/2012  *RADIOLOGY REPORT*  Clinical Data: Chest pain.  Shortness of breath.  Recent thoracotomy for  left-sided mediastinal mass.  CHEST - 2 VIEW  Comparison: Portable chest x-rays 01/17/2012 dating back to 01/13/2012.  Two-view chest x-ray 01/12/2012.  Findings: Post-surgical changes along the left side the mediastinum post resection of the mediastinal mass.  Resolution of the left pneumothorax since the 01/17/2012 examination.  Small bilateral pleural effusions and associated mild passive atelectasis in the lower lobes.  Lungs otherwise clear.  Cardiac silhouette normal in size, unchanged.  Pulmonary vascularity normal.  Visualized bony thorax intact.  IMPRESSION: Resolution of the left pneumothorax since 01/17/2012.  Residual small bilateral pleural effusions and associated passive atelectasis in the lower lobes.  No acute cardiopulmonary disease otherwise.  Original Report Authenticated By: Arnell Sieving, M.D.   Dg Chest 2 View  01/12/2012  *RADIOLOGY REPORT*  Clinical Data: 78 year old male status preop for resection of thymoma, left thoracotomy.  History of prostate cancer, stroke.  CHEST - 2 VIEW  Comparison: 12/07/2011 and earlier.  Findings: Large left mediastinal mass re-identified on the lateral view, difficult to delineate from the remaining mediastinal contours on the frontal view. Overall mediastinal contours are stable since 12/02/2011.  Larger lung volumes.  No pneumothorax, pulmonary edema, pleural effusion or acute pulmonary opacity. No acute osseous abnormality identified.  Visualized tracheal air column is within normal limits.  Upper abdominal drain or catheter partially visible on both views.  IMPRESSION: 1.  Radiographically stable left mediastinal mass. 2.  No new cardiopulmonary abnormality.  Original Report Authenticated By: Harley Hallmark, M.D.   Dg Cholangiogram Operative  02/01/2012  *RADIOLOGY REPORT*  Clinical Data:   Cholelithiasis  INTRAOPERATIVE CHOLANGIOGRAM  Technique:  Cholangiographic images from the C-arm fluoroscopic device were submitted for interpretation  post-operatively.  Please see the procedural report for the amount of contrast and the fluoroscopy time utilized.  Comparison:  None  Findings: There are 2 small nonocclusive persistent filling defects in the distal common duct. Intrahepatic ducts are incompletely visualized, appearing decompressed centrally. Contrast passes into the duodenum. A small amount contrast refluxes into the pancreatic duct.  IMPRESSION  1.  Persistent small filling defects in the distal CBD suggesting retained nonocclusive calculi.  Original Report Authenticated By: Osa Craver, M.D.   Ct Chest W Contrast  01/29/2012  *RADIOLOGY REPORT*  Clinical Data:  Right quadrant pain, recent thoracotomy for thymoma.  Evaluate for empyema. The patients has history of cholecystostomy tube which  the patient pulled out apparently.  CT CHEST AND ABDOMEN WITH CONTRAST  Technique:  Multidetector CT imaging of the chest and abdomen was performed following the standard protocol during bolus administration of intravenous contrast.  Contrast: OMNIPAQUE IOHEXOL 300 MG/ML  SOLN  Comparison:  CT scan 05/10/2013CT CHEST  Findings:  No axillary or supraclavicular lymphadenopathy.  There is postsurgical change along the right lateral chest wall with muscle edema related to recent thoracotomy.  No evidence of abscess.  There is interval resection of the large anterior mediastinal mass. There is mild bibasilar atelectasis.  No evidence of empyema or pulmonary abscess.  No pericardial fluid.  The ascending aorta is enlarged to the 46 x 47 mm unchanged from prior.  IMPRESSION:  1.  No evidence of empyema or pulmonary abscess following thymoma resection.  2.  Bibasilar atelectasis and small effusions. 3.  Stable ascending aortic aneurysm.  CT ABDOMEN  Findings:  There is thickening of the gallbladder wall.  The gallbladder is decompressed following placement of cholecystostomy tube.  Cholecystectomy tube has been removed.  There is a fluid collection  along the right lateral hepatic lobe measuring 6.0 x 1.8 cm (image 63).  This could represent early abscess.  No focal hepatic lesion.  The pancreas, spleen, adrenal glands, and kidneys are stable.  There are bilateral nephrolithiasis without evidence obstruction.  The stomach and limited view of the small bowel and colon are unremarkable.  Abdominal aorta is normal. Review of  bone windows demonstrates no aggressive  osseous lesions.  IMPRESSION:  1.  New fluid collection along the right lateral hepatic lobe adjacent to gallbladder fossa but could represent an early abscess. This is in the course of the prior cholecystostomy tube. 2.  The gallbladder is decompressed and thick-walled.  Original Report Authenticated By: Genevive Bi, M.D.   Ct Abdomen W Contrast  01/29/2012  *RADIOLOGY REPORT*  Clinical Data:  Right quadrant pain, recent thoracotomy for thymoma.  Evaluate for empyema. The patients has history of cholecystostomy tube which  the patient pulled out apparently.  CT CHEST AND ABDOMEN WITH CONTRAST  Technique:  Multidetector CT imaging of the chest and abdomen was performed following the standard protocol during bolus administration of intravenous contrast.  Contrast: OMNIPAQUE IOHEXOL 300 MG/ML  SOLN  Comparison:  CT scan 05/10/2013CT CHEST  Findings:  No axillary or supraclavicular lymphadenopathy.  There is postsurgical change along the right lateral chest wall with muscle edema related to recent thoracotomy.  No evidence of abscess.  There is interval resection of the large anterior mediastinal mass. There is mild bibasilar atelectasis.  No evidence of empyema or pulmonary abscess.  No pericardial fluid.  The ascending aorta is enlarged to the 46 x 47 mm unchanged from prior.  IMPRESSION:  1.  No evidence of empyema or pulmonary abscess following thymoma resection.  2.  Bibasilar atelectasis and small effusions. 3.  Stable ascending aortic aneurysm.  CT ABDOMEN  Findings:  There is thickening  of the gallbladder wall.  The gallbladder is decompressed following placement of cholecystostomy tube.  Cholecystectomy tube has been removed.  There is a fluid collection along the right lateral hepatic lobe measuring 6.0 x 1.8 cm (image 63).  This could represent early abscess.  No focal hepatic lesion.  The pancreas, spleen, adrenal glands, and kidneys are stable.  There are bilateral nephrolithiasis without evidence obstruction.  The stomach and limited view of the small bowel and colon are unremarkable.  Abdominal aorta is normal. Review of  bone windows demonstrates no aggressive osseous lesions.  IMPRESSION:  1.  New fluid collection along the right lateral hepatic lobe adjacent to gallbladder fossa but could represent an early abscess. This is in the course of the prior cholecystostomy tube. 2.  The gallbladder is decompressed and thick-walled.  Original Report Authenticated By: Genevive Bi, M.D.   Ir Catheter Tube Change  01/18/2012  *RADIOLOGY REPORT*  Clinical Data/Indication: CHECK CHOLECYSTOSTOMY TUBE.  IR CATHETER TUBE CHANGE  Fluoroscopy Time: 1.6 minutes.  Procedure: The procedure, risks, benefits, and alternatives were explained to the patient. Questions regarding the procedure were encouraged and answered. The patient understands and consents to the procedure.  The right upper quadrant was prepped with betadine in a sterile fashion, and a sterile drape was applied covering the operative field. A sterile gown and sterile gloves were used for the procedure.  Spot imaging over the right upper quadrant was performed.  Contrast was injected into the biliary drain.  Initial imaging demonstrates occlusion of the cystic duct.  A gallstone is seen in the neck of the gallbladder. Delayed imaging demonstrates subsequent filling of the cystic and common bile duct.  The area was prepped and draped in a sterile fashion and 1% lidocaine was utilized for anesthesia.  The malpositioned cholecystostomy drain  was then cut and removed over a Benson wire for a Kumpe catheter.  The Kumpe catheter was negotiated over the Lorane wire into the lumen of the gallbladder then exchanged for a 10-French pigtail drain this was  coiled in the gallbladder.  It was looped and string fixed then sewn to the skin.  Contrast was injected.  Findings: Initial imaging demonstrates that the cholecystostomy tube is coiled adjacent to the gallbladder lumen.  Contrast does fill gallbladder but there is delayed filling of the biliary tree compatible with poor filling of the cystic duct.  The drain was exchanged and the new drain is not coiled within the lumen of the gallbladder.  Complications: None.  IMPRESSION: There is delayed filling of the cystic and common bile ducts secondary to a gallstone in the neck.  An element of cystic duct obstruction is therefore present.  The drain was left in place.  The drain was malpositioned adjacent to the gallbladder.  It was exchanged for a new drain which was positioned within the lumen of the gallbladder.  Original Report Authenticated By: Donavan Burnet, M.D.   Ir Era Skeen W Catheter Placement  01/31/2012  *RADIOLOGY REPORT*  Clinical Data: Cholecystitis, status post cholecystostomy tube placement.  The tube was inadvertently withdrawn.  Pain.  Recent CT demonstrates perihepatic loculated fluid possibly myeloma or abscess.  ABSCESS DRAINAGE,IR ULTRASOUND GUIDANCE TISSUE ABLATION  The purpose, procedure, and risks have been discussed with the patient and all  questions were answered. Thereafter, the patient consented to the proposed procedure.,  Comparison: CT 01/29/2012  Operator: Archer Asa Assist:  Deanne Coffer  Technique and findings:  The procedure, risks (including but not limited to bleeding, infection, organ damage), benefits, and alternatives were explained to the patient.  Questions regarding the procedure were encouraged and answered.  The patient understands and consents to the procedure.  Survey ultrasound of the right upper abdomen was   performed and the perihepatic collection was localized.  An appropriate skin entry site was selected. Operator donned sterile gloves and mask.   Site was marked, prepped with Betadine, draped in usual sterile fashion, infiltrated locally with 1% lidocaine.  Intravenous Fentanyl and Versed were administered as conscious sedation during continuous cardiorespiratory monitoring by the radiology RN, with a total moderate sedation time of 10 minutes.  Under real time ultrasound guidance, an 18 gauge needle was advanced into the perihepatic collection. Ultrasound documentation was stored . Cloudy thin fluid spontaneously returned through the needle hub.  An Amplatz guide wire advanced easily into the collection, confirmed on ultrasound.The tract was dilated to facilitate placement of a 10.2 French pigtail drain within the collection.  Contrast injection confirms appropriate positioning. No communication to the gallbladder or biliary tree is identified. 20 ml were aspirated, sent for Gram stain, culture and sensitivity. Catheter secured externally with O-Prolene suture and placed to gravity bag. The patient tolerated the procedure well. No immediate complication.  IMPRESSION: Technically successful abscess drainage catheter placement into perihepatic collection.  Original Report Authenticated By: Osa Craver, M.D.   Ir US Guide Bx Asp/drain  01/31/2012  *RADIOLOGY REPORT*  Clinical Data: Cholecystitis, status post cholecystostomy tube placement.  The tube was inadvertently withdrawn.  Pain.  Recent CT demonstrates perihepatic loculated fluid possibly myeloma or abscess.  ABSCESS DRAINAGE,IR ULTRASOUND GUIDANCE TISSUE ABLATION  The purpose, procedure, and risks have been discussed with the patient and all  questions were answered. Thereafter, the patient consented to the proposed procedure.,  Comparison: CT 01/29/2012  Operator: Archer Asa Assist:  Deanne Coffer   Technique and findings:  The procedure, risks (including but not limited to bleeding, infection, organ damage), benefits, and alternatives were explained to the patient.  Questions regarding the procedure were encouraged and answered.  The patient understands and consents to the procedure. Survey ultrasound of the right upper abdomen was   performed and the perihepatic collection was localized.  An appropriate skin entry site was selected. Operator donned sterile gloves and mask.   Site was marked, prepped with Betadine, draped in usual sterile fashion, infiltrated locally with 1% lidocaine.  Intravenous Fentanyl and Versed were administered as conscious sedation during continuous cardiorespiratory monitoring by the radiology RN, with a total moderate sedation time of 10 minutes.  Under real time ultrasound guidance, an 18 gauge needle was advanced into the perihepatic collection. Ultrasound documentation was stored . Cloudy thin fluid spontaneously returned through the needle hub.  An Amplatz guide wire advanced easily into the collection, confirmed on ultrasound.The tract was dilated to facilitate placement of a 10.2 French pigtail drain within the collection.  Contrast injection confirms appropriate positioning. No communication to the gallbladder or biliary tree is identified. 20 ml were aspirated, sent for Gram stain, culture and sensitivity. Catheter secured externally with O-Prolene suture and placed to gravity bag. The patient tolerated the procedure well. No immediate complication.  IMPRESSION: Technically successful abscess drainage catheter placement into perihepatic collection.  Original Report Authenticated By: Osa Craver, M.D.   Dg Chest Port 1 View  01/17/2012  *RADIOLOGY REPORT*  Clinical Data: Postoperative evaluation.  Chest tube.  PORTABLE CHEST - 1 VIEW  Comparison: Chest x-ray 01/16/2012.  Findings: New compared to yesterday's examination there has been interval removal of the  previously noted left-sided chest tube.  At this time, there is a small left-sided pneumothorax (approximately 10% of the volume of the left hemithorax) noted surrounding the left upper lung, with a component in the inferior aspect of the left costophrenic sulcus as well.  There is a left-sided internal jugular central venous catheter with tip terminating in the proximal superior vena cava. There is architectural distortion in the left hemithorax and an appearance of left lateral pleural thickening and/or loculated pleural fluid.  The right lung is low volume, but otherwise unremarkable.  Heart size is within normal limits.  Mediastinal contours are distorted by patient's mild rotation to the right.  Atherosclerotic calcifications are noted within the arch of the aorta.  Multiple surgical clips are seen overlying the left upper heart border, at site of recent mass resection.  IMPRESSION: 1.  Status post removal of left-sided chest tube with small left- sided pneumothorax (approximately 10% of the volume of the left hemithorax). 2.  Left lateral pleural thickening versus loculated pleural fluid. 3.  Additional postoperative changes, and support apparatus, as above.  Original Report Authenticated By: Florencia Reasons, M.D.   Dg Chest Port 1 View  01/16/2012  *RADIOLOGY REPORT*  Clinical Data: Postop  PORTABLE CHEST - 1 VIEW  Comparison: Yesterday  Findings: Left chest tube and left internal jugular vein central venous catheter are stable.  Tiny left apical pneumothorax. Bibasilar atelectasis stable.  No right pneumothorax.  Upper normal heart size.  IMPRESSION: Tiny left apical pneumothorax.  Stable bibasilar atelectasis.  Original Report Authenticated By: Donavan Burnet, M.D.   Dg Chest Port 1 View  01/15/2012  *RADIOLOGY REPORT*  Clinical Data: Postop resection of mediastinal mass.  Left chest tubes in place.  PORTABLE CHEST - 1 VIEW 01/15/2012 0734 hours:  Comparison: Portable chest x-rays yesterday and  01/13/2012.  Findings: Two left chest tubes in place with no pneumothorax. Suboptimal inspiration.  Atelectasis in the lung bases, left greater than right, with improved aeration since yesterday.  No new pulmonary parenchymal abnormalities.  Cardiac silhouette normal in size.  Pulmonary vascularity normal.  Left jugular central venous catheter tip in the upper SVC.  IMPRESSION: Support apparatus satisfactory.  No pneumothorax.  Improved aeration in the lung bases with mild to moderate atelectasis persisting, left greater than right.  No new abnormalities.  Original Report Authenticated By: Arnell Sieving, M.D.   Dg Chest Port 1 View  01/14/2012  *RADIOLOGY REPORT*  Clinical Data: Chest tube.  Status post VATS.  PORTABLE CHEST - 1 VIEW  Comparison: Portable chest 01/13/2012.  Findings: Left-sided chest tubes are stable position.  There is no significant pneumothorax.  A left IJ line is stable.  The lung volumes remain low.  Plate like atelectasis is noted at the right lung base.  There is dependent atelectasis at the left base.  A small effusion is not excluded.  IMPRESSION:  1.  Stable left-sided chest tubes without pneumothorax. 2.  Plate-like atelectasis at the right lung base. 3.  Dependent atelectasis and probable effusion at the left base.  Original Report Authenticated By: Jamesetta Orleans. MATTERN, M.D.   Dg Chest Portable 1 View  01/13/2012  *RADIOLOGY REPORT*  Clinical Data: Postoperative radiograph  PORTABLE CHEST - 1 VIEW  Comparison: 01/12/2012  Findings: Postoperative changes with surgical clips along the left mediastinal / heart border. Left chest tube in place.  There is subcutaneous gas along the left hemithorax.  No definite pneumothorax.  Aortic arch atherosclerosis.  Cardiomegaly. Bibasilar opacities.  Small right pleural effusion not excluded. Left IJ catheter tip projects over the left brachiocephalic vein.  IMPRESSION: Interval resection of the mediastinal mass.  Left chest tube in  place.  No significant pneumothorax.  Bibasilar opacities; atelectasis versus infiltrate.  Cardiomegaly.  Original Report Authenticated By: Waneta Martins, M.D.     Review of Systems  Unable to perform ROS Respiratory: Negative for shortness of breath and wheezing.   Gastrointestinal: Negative for vomiting and abdominal distention.  Neurological: Negative for dizziness and seizures.       Objective:   Physical Exam  Constitutional: He is oriented to person, place, and time. He appears well-developed and well-nourished. No distress.  HENT:  Head: Normocephalic.  Mouth/Throat: Oropharynx is clear and moist. No oropharyngeal exudate.  Eyes: Conjunctivae and EOM are normal. Pupils are equal, round, and reactive to light. No scleral icterus.  Neck: Normal range of motion. No tracheal deviation present.  Cardiovascular: Normal rate, normal heart sounds and intact distal pulses.   Pulmonary/Chest: Effort normal. No respiratory distress.  Abdominal: Soft. He exhibits no distension. There is no tenderness. Hernia confirmed negative in the right inguinal area and confirmed negative in the left inguinal area.         Incisions clean with normal healing ridges.  No hernias  Musculoskeletal: Normal range of motion. He exhibits no tenderness.  Neurological: He is alert and oriented to person, place, and time. No cranial nerve deficit. He exhibits normal muscle tone. Coordination normal.  Skin: Skin is warm and dry. No rash noted. He is not diaphoretic.  Psychiatric: His mood appears not anxious. He is withdrawn. He is not actively hallucinating and not combative. Cognition and memory are impaired. He does not exhibit a depressed mood. He expresses no homicidal ideation.       Assessment:     POD#6 s/p lap chole for cholecystitis temporized by perc drainage     Plan:     Increase activity as tolerated.  Do not push through  pain.  Advanced on diet as tolerated. Bowel regimen to avoid  problems.  Complete oral Augmentin antibiotics to minimize recurrence of abscess or further issues. (Day 6/10)  Return to clinic PRN, call with questions or concerns. If he is having nausea vomiting fevers or worsening appetite, then certainly we should see him again to make sure he does not have any other further issues.  I would hold off on any blood work or other radiology studies unless there is a clear clinical indication.   He had some questionable common bile duct stones on cholangiography but they were small intestine certainly not obstructing.  I would not put him through an ERCP unless there is a strong clinical indication right this or cholangitis.  He does not seem to have any evidence of this at this time.  I offered to have him come back again but he did not seem particularly interested in that.  The patient expressed some appreciation but insight is poor.

## 2012-02-07 NOTE — Patient Instructions (Addendum)

## 2012-02-16 ENCOUNTER — Other Ambulatory Visit: Payer: Self-pay | Admitting: Surgery

## 2012-02-16 DIAGNOSIS — D382 Neoplasm of uncertain behavior of pleura: Secondary | ICD-10-CM

## 2012-02-21 ENCOUNTER — Ambulatory Visit
Admission: RE | Admit: 2012-02-21 | Discharge: 2012-02-21 | Disposition: A | Discharge status: Home / Self Care | Payer: No Typology Code available for payment source | Source: Ambulatory Visit | Attending: Surgery | Admitting: Surgery

## 2012-02-21 ENCOUNTER — Ambulatory Visit (INDEPENDENT_AMBULATORY_CARE_PROVIDER_SITE_OTHER): Payer: Self-pay | Admitting: Physician Assistant

## 2012-02-21 VITALS — BP 125/75 | HR 110 | Resp 20 | Ht 72.0 in | Wt 168.0 lb

## 2012-02-21 DIAGNOSIS — D150 Benign neoplasm of thymus: Secondary | ICD-10-CM

## 2012-02-21 DIAGNOSIS — Z9889 Other specified postprocedural states: Secondary | ICD-10-CM

## 2012-02-21 DIAGNOSIS — D382 Neoplasm of uncertain behavior of pleura: Secondary | ICD-10-CM

## 2012-02-21 DIAGNOSIS — D384 Neoplasm of uncertain behavior of thymus: Secondary | ICD-10-CM

## 2012-02-21 NOTE — Progress Notes (Signed)
  HPI: Patient returns for routine postoperative follow-up having undergone Thymectomy on 01/13/2012. The patient's early postoperative recovery while in the hospital was notable for confusion which was to be expected due to patients history of dementia.  Patient is confused this afternoon on evaluation.  He actually removed shirt in the waiting room and was dropped off by facility and left unattended.  Patient was not able to answer questions and just moaned in response.    Current Outpatient Prescriptions  Medication Sig Dispense Refill  . acetaminophen (TYLENOL) 500 MG tablet Take 1,000 mg by mouth every 6 (six) hours as needed. For pain      . Alum & Mag Hydroxide-Simeth (MAGIC MOUTHWASH) SOLN Take 15 mLs by mouth 4 (four) times daily as needed (sore throat).  15 mL  5  . antiseptic oral rinse (BIOTENE) LIQD 15 mLs by Mouth Rinse route 2 (two) times daily.  1 Bottle  1  . cinacalcet (SENSIPAR) 30 MG tablet Take 1 tablet (30 mg total) by mouth 2 (two) times daily with a meal.  60 tablet  0  . feeding supplement (ENSURE COMPLETE) LIQD Take 237 mLs by mouth 2 (two) times daily with breakfast and lunch.  1 Bottle  11  . HYDROcodone-acetaminophen (NORCO/VICODIN) 5-325 MG per tablet Take 1 tablet by mouth every 6 (six) hours as needed.      Marland Kitchen morphine (MS CONTIN) 15 MG 12 hr tablet Take 1 tablet (15 mg total) by mouth 2 (two) times daily.  60 tablet  0  . nystatin cream (MYCOSTATIN) Apply 1 application topically 2 (two) times daily. To groin.      . phenol (CHLORASEPTIC) 1.4 % LIQD Use as directed 2 sprays in the mouth or throat as needed.  118 mL  1  . polyethylene glycol (MIRALAX / GLYCOLAX) packet Take 17 g by mouth daily as needed. Constipation.      . saccharomyces boulardii (FLORASTOR) 250 MG capsule Take 250 mg by mouth 2 (two) times daily.      . traMADol (ULTRAM) 50 MG tablet Take 1-2 tablets (50-100 mg total) by mouth every 6 (six) hours as needed for pain. Pain.  30 tablet  0  . zolpidem  (AMBIEN) 5 MG tablet Take 5 mg by mouth at bedtime as needed. Sleep.        Physical Exam:  BP 125/75  Pulse 110  Resp 20  Ht 6' (1.829 m)  Wt 168 lb (76.204 kg)  BMI 22.78 kg/m2  SpO2 93%  Gen:  Patient appears chronically ill, confused Heart: RRR Lungs: CTA bilaterally Skin: left thoracotomy incisions healing well, sutures were in place and these were removed  Diagnostic Tests:  CXR: improvement of bibasilar atelectasis, no pleural effusion or pneumothorax appreciated  Impression:  [name] is 78 yo male with marked Dementia.  He is S/P Thymectomy performed 01/13/2012.  The patients appears to be stable from surgery standpoint.  However, when questioned patient he did not provide answers in regards to chest pain, shortness of breath, or overall how he is feeling.  Plan:  RTC prn, recommended to facility to not leave patient unattended at doctors visits, due to marked confusion

## 2012-02-24 ENCOUNTER — Encounter (INDEPENDENT_AMBULATORY_CARE_PROVIDER_SITE_OTHER): Payer: Medicare Other | Admitting: General Surgery

## 2012-07-01 IMAGING — CR DG CHEST 2V
3 series · 3 of 3 positions shown · non-contrast
Comparison: 12/07/2011 and earlier.

CLINICAL DATA: 86-year-old male status preop for resection of
thymoma, left thoracotomy.  History of prostate cancer, stroke.

CHEST - 2 VIEW

[view not recorded (1 of 3)]
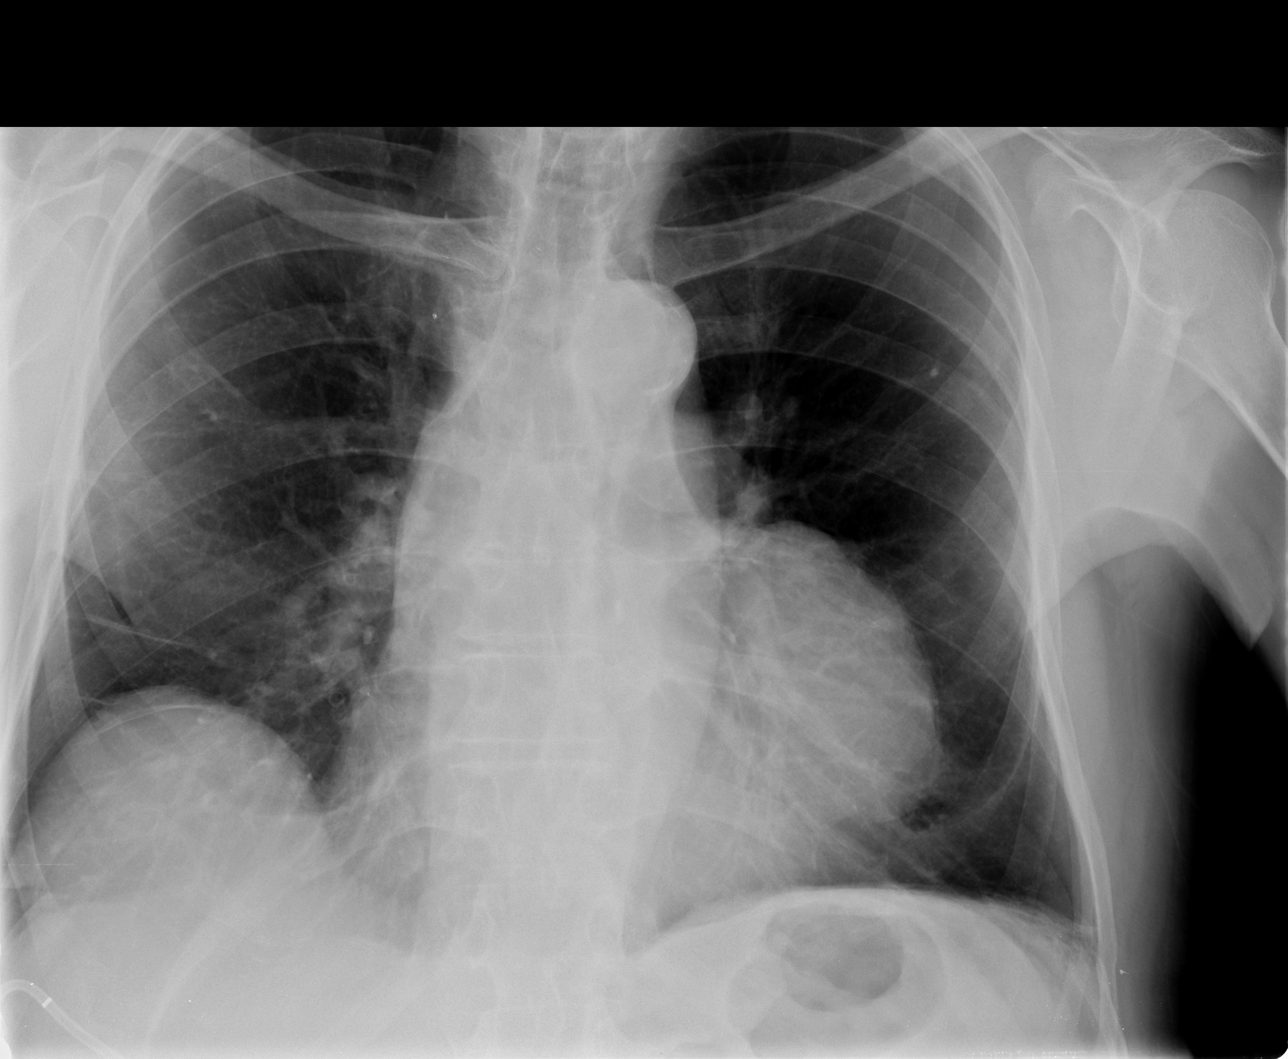

[view not recorded (2 of 3)]
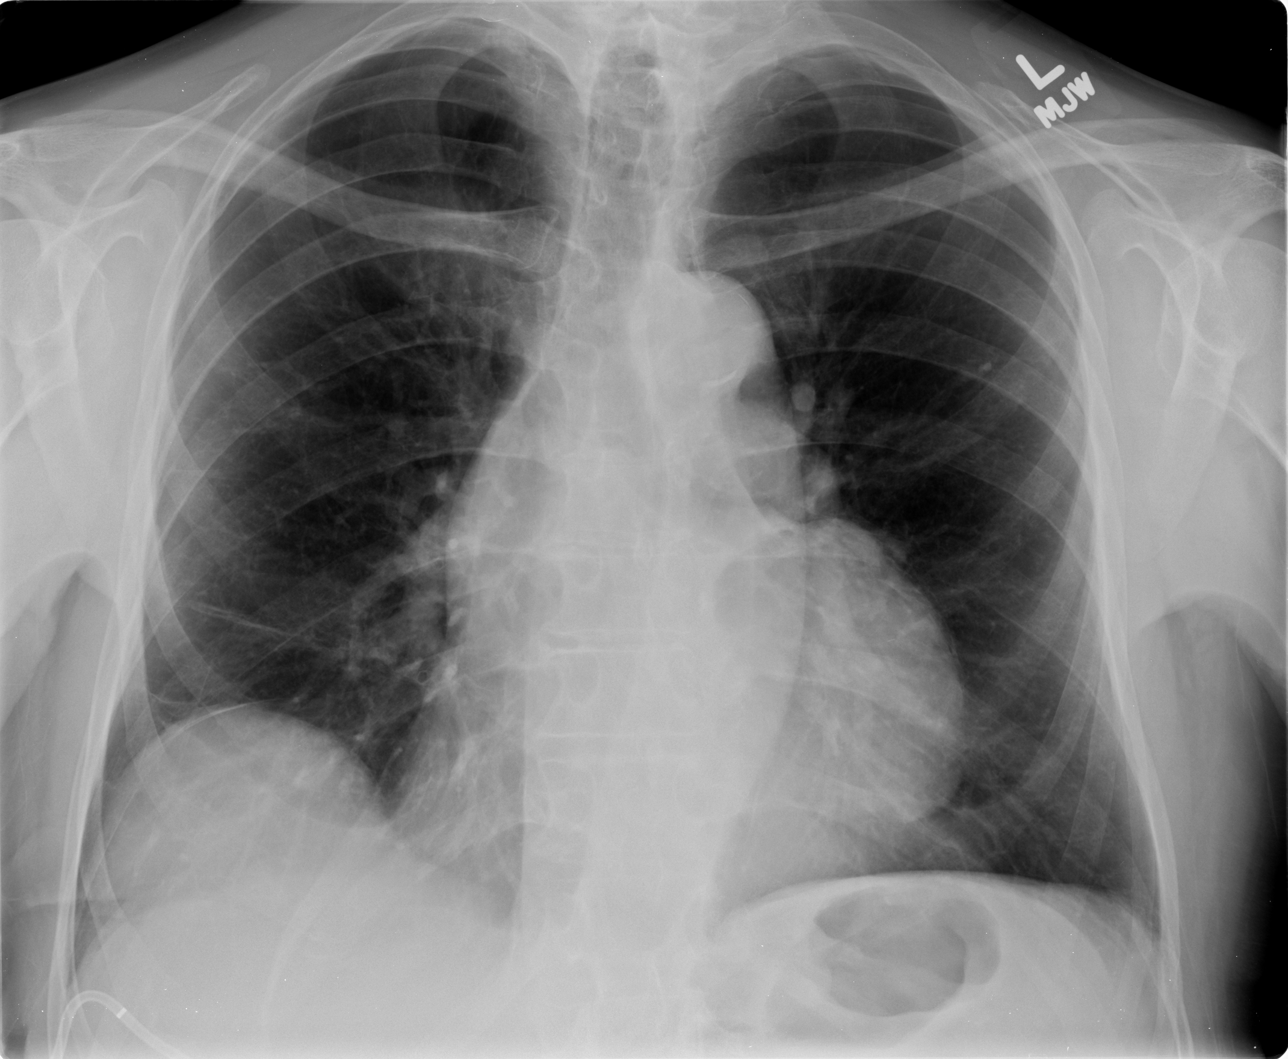

[view not recorded (3 of 3)]
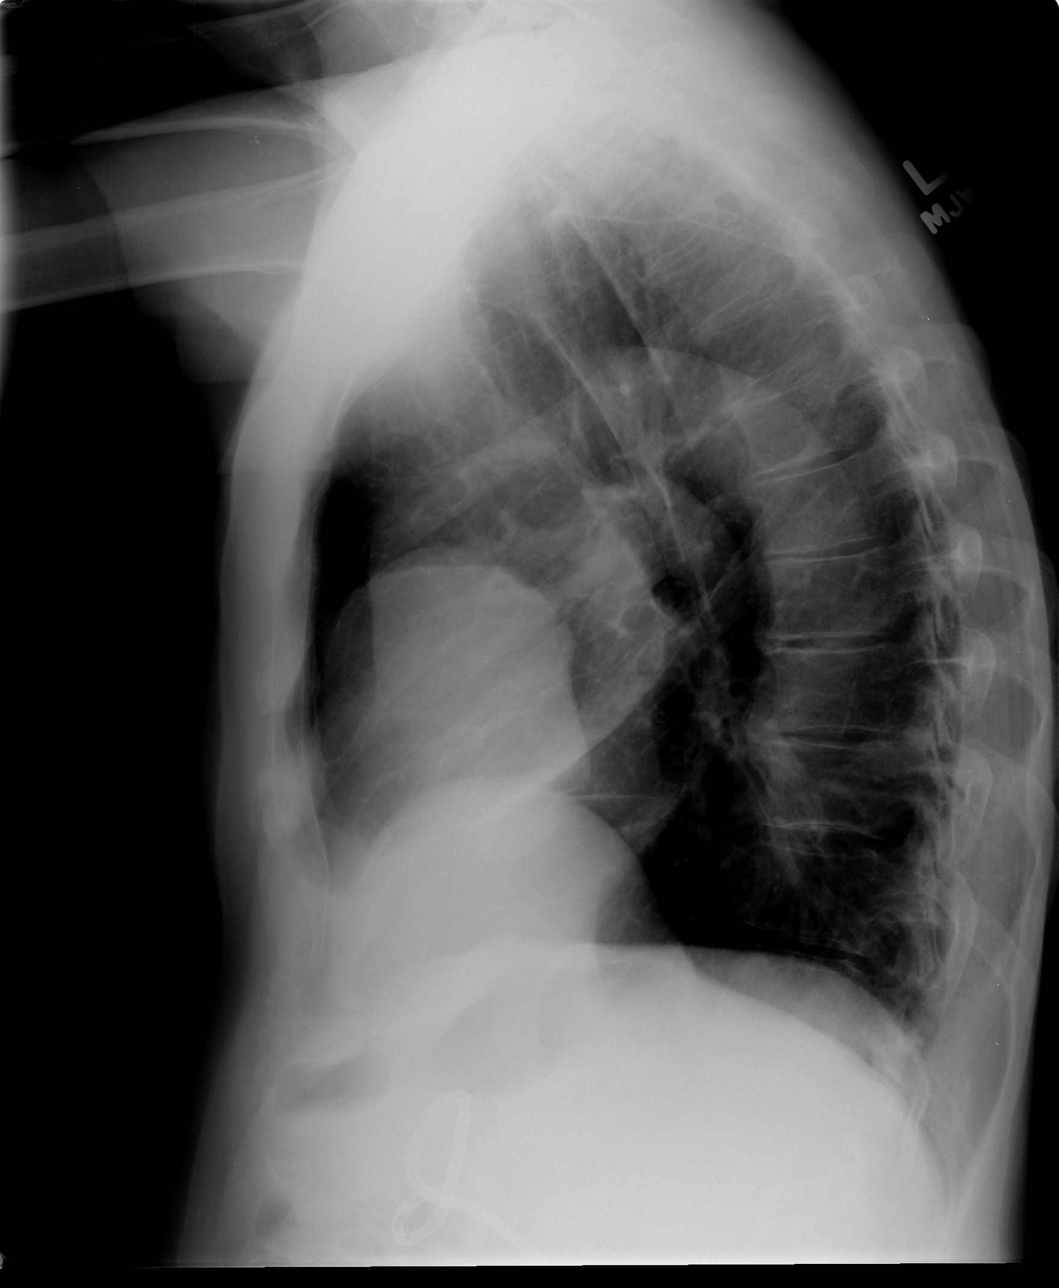

[3 of 3 positions shown; findings below may reference images not displayed]

FINDINGS: Large left mediastinal mass re-identified on the lateral
view, difficult to delineate from the remaining mediastinal
contours on the frontal view. Overall mediastinal contours are
stable since 12/02/2011.

Larger lung volumes.  No pneumothorax, pulmonary edema, pleural
effusion or acute pulmonary opacity. No acute osseous abnormality
identified.  Visualized tracheal air column is within normal
limits.  Upper abdominal drain or catheter partially visible on
both views.
IMPRESSION: 1.  Radiographically stable left mediastinal mass.
2.  No new cardiopulmonary abnormality.

## 2012-07-02 IMAGING — CR DG CHEST 1V PORT
1 series · 1 of 1 positions shown · non-contrast
Comparison: 01/12/2012

CLINICAL DATA: Postoperative radiograph

PORTABLE CHEST - 1 VIEW

[AP]
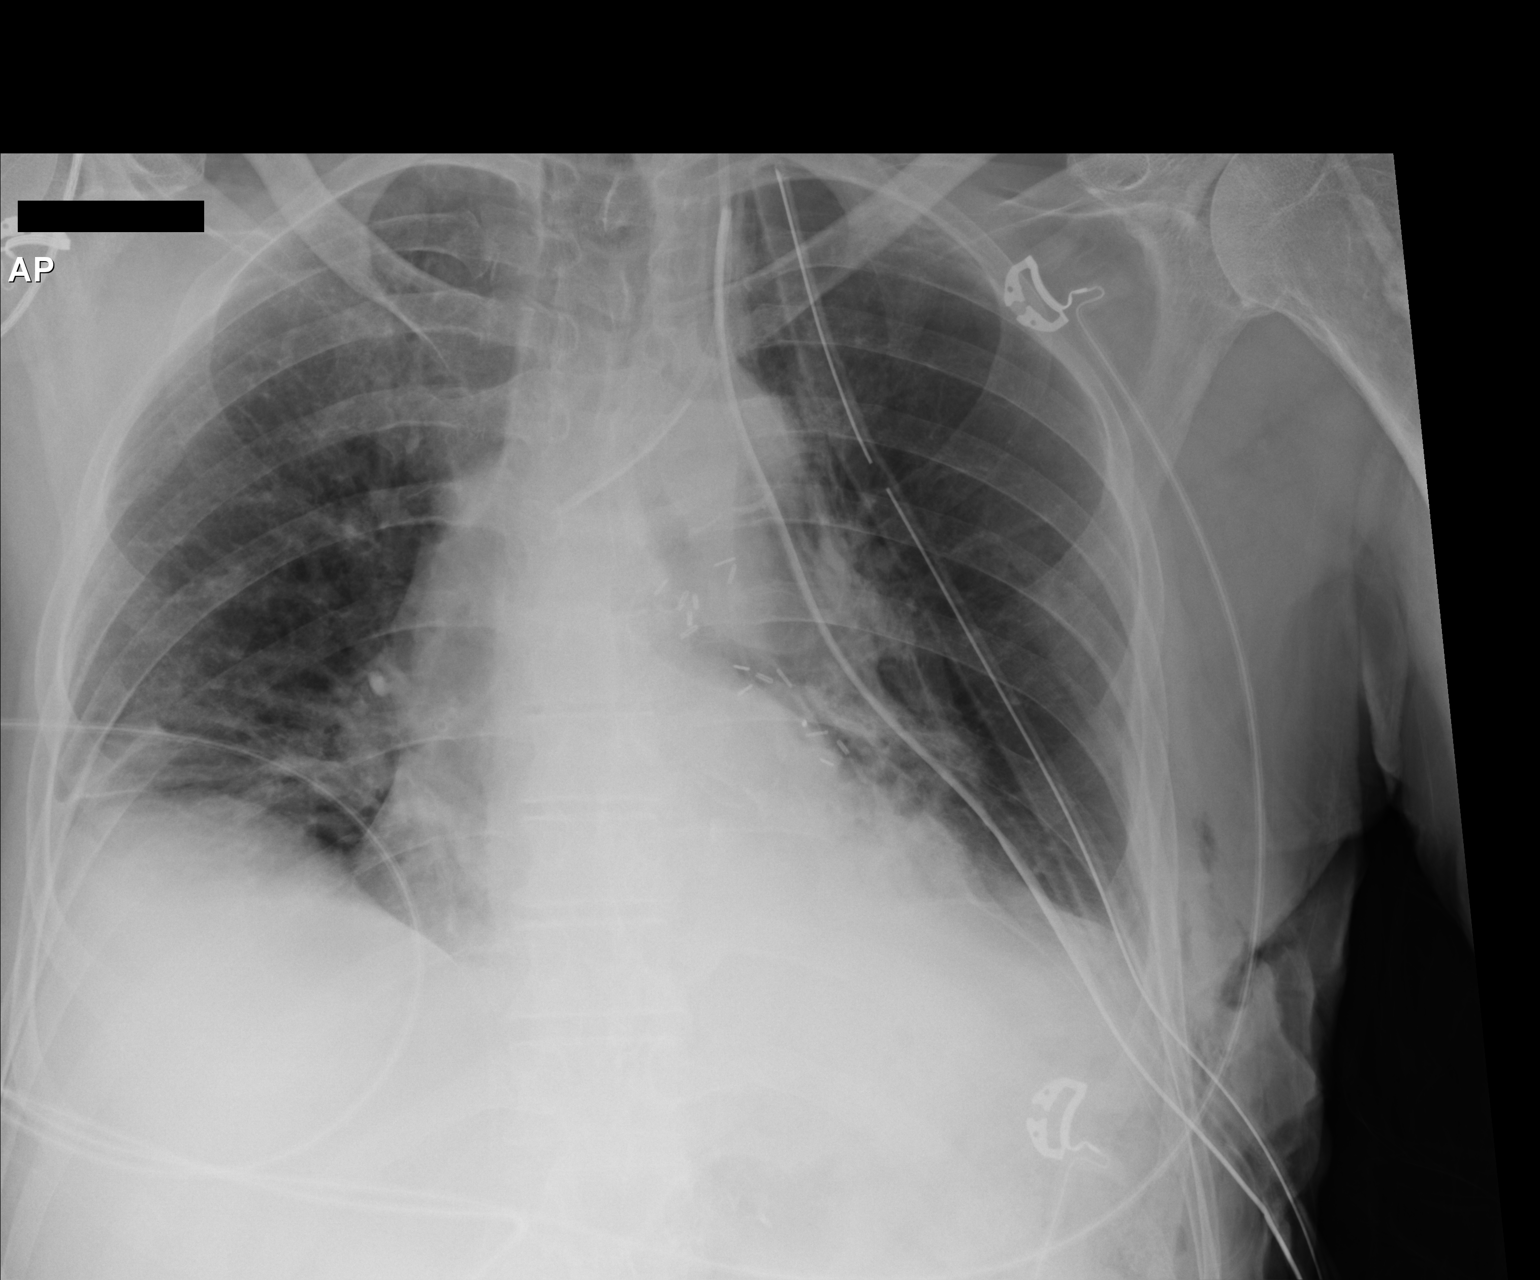

[1 of 1 positions shown; findings below may reference images not displayed]

FINDINGS: Postoperative changes with surgical clips along the left
mediastinal / heart border. Left chest tube in place.  There is
subcutaneous gas along the left hemithorax.  No definite
pneumothorax.  Aortic arch atherosclerosis.  Cardiomegaly.
Bibasilar opacities.  Small right pleural effusion not excluded.
Left IJ catheter tip projects over the left brachiocephalic vein.
IMPRESSION: Interval resection of the mediastinal mass.  Left chest tube in
place.  No significant pneumothorax.

Bibasilar opacities; atelectasis versus infiltrate.

Cardiomegaly.

## 2012-07-03 IMAGING — CR DG CHEST 1V PORT
1 series · 1 of 1 positions shown · non-contrast
Comparison: Portable chest 01/13/2012.

CLINICAL DATA: Chest tube.  Status post VATS.

PORTABLE CHEST - 1 VIEW

[AP]
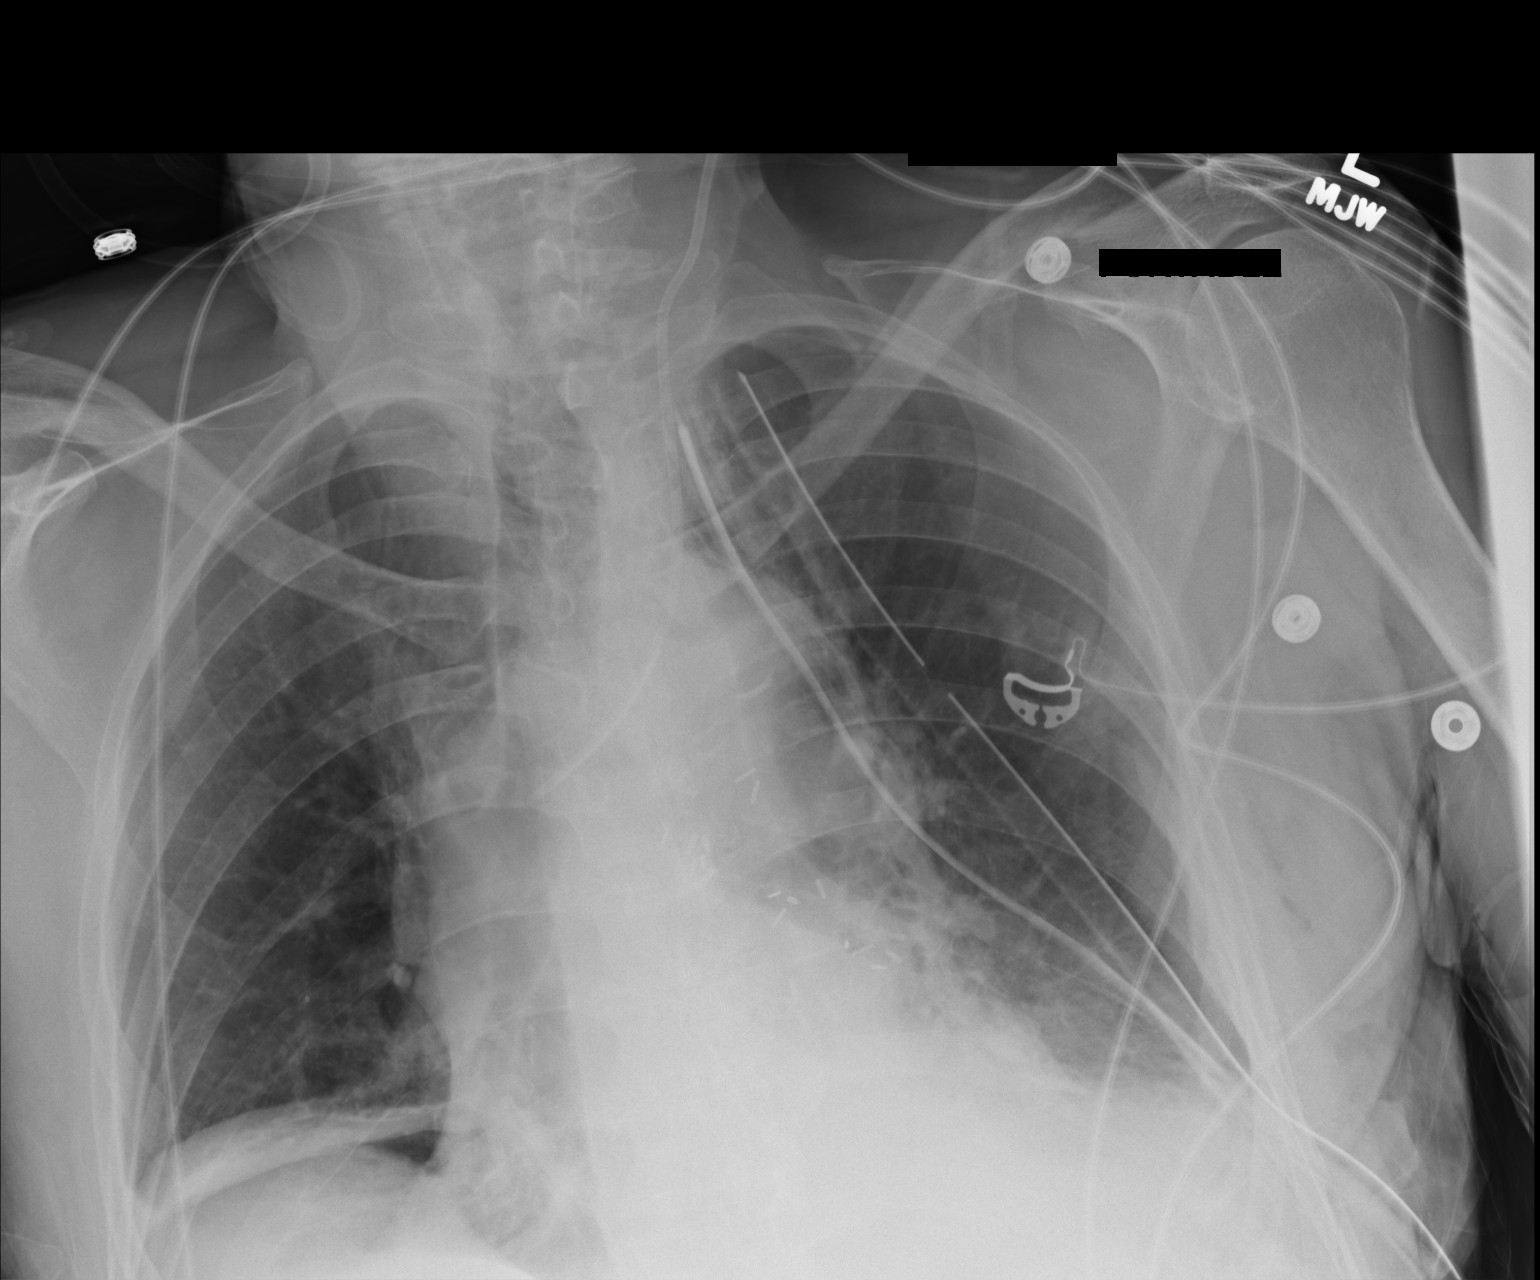

[1 of 1 positions shown; findings below may reference images not displayed]

FINDINGS: Left-sided chest tubes are stable position.  There is no
significant pneumothorax.  A left IJ line is stable.  The lung
volumes remain low.  Plate like atelectasis is noted at the right
lung base.  There is dependent atelectasis at the left base.  A
small effusion is not excluded.
IMPRESSION: 1.  Stable left-sided chest tubes without pneumothorax.
2.  Plate-like atelectasis at the right lung base.
3.  Dependent atelectasis and probable effusion at the left base.

## 2012-07-04 IMAGING — CR DG CHEST 1V PORT
1 series · 1 of 1 positions shown · non-contrast
Comparison: Portable chest x-rays yesterday and 01/13/2012.

CLINICAL DATA: Postop resection of mediastinal mass.  Left chest
tubes in place.

PORTABLE CHEST - 1 VIEW [DATE]/[id number] hours:

[AP]
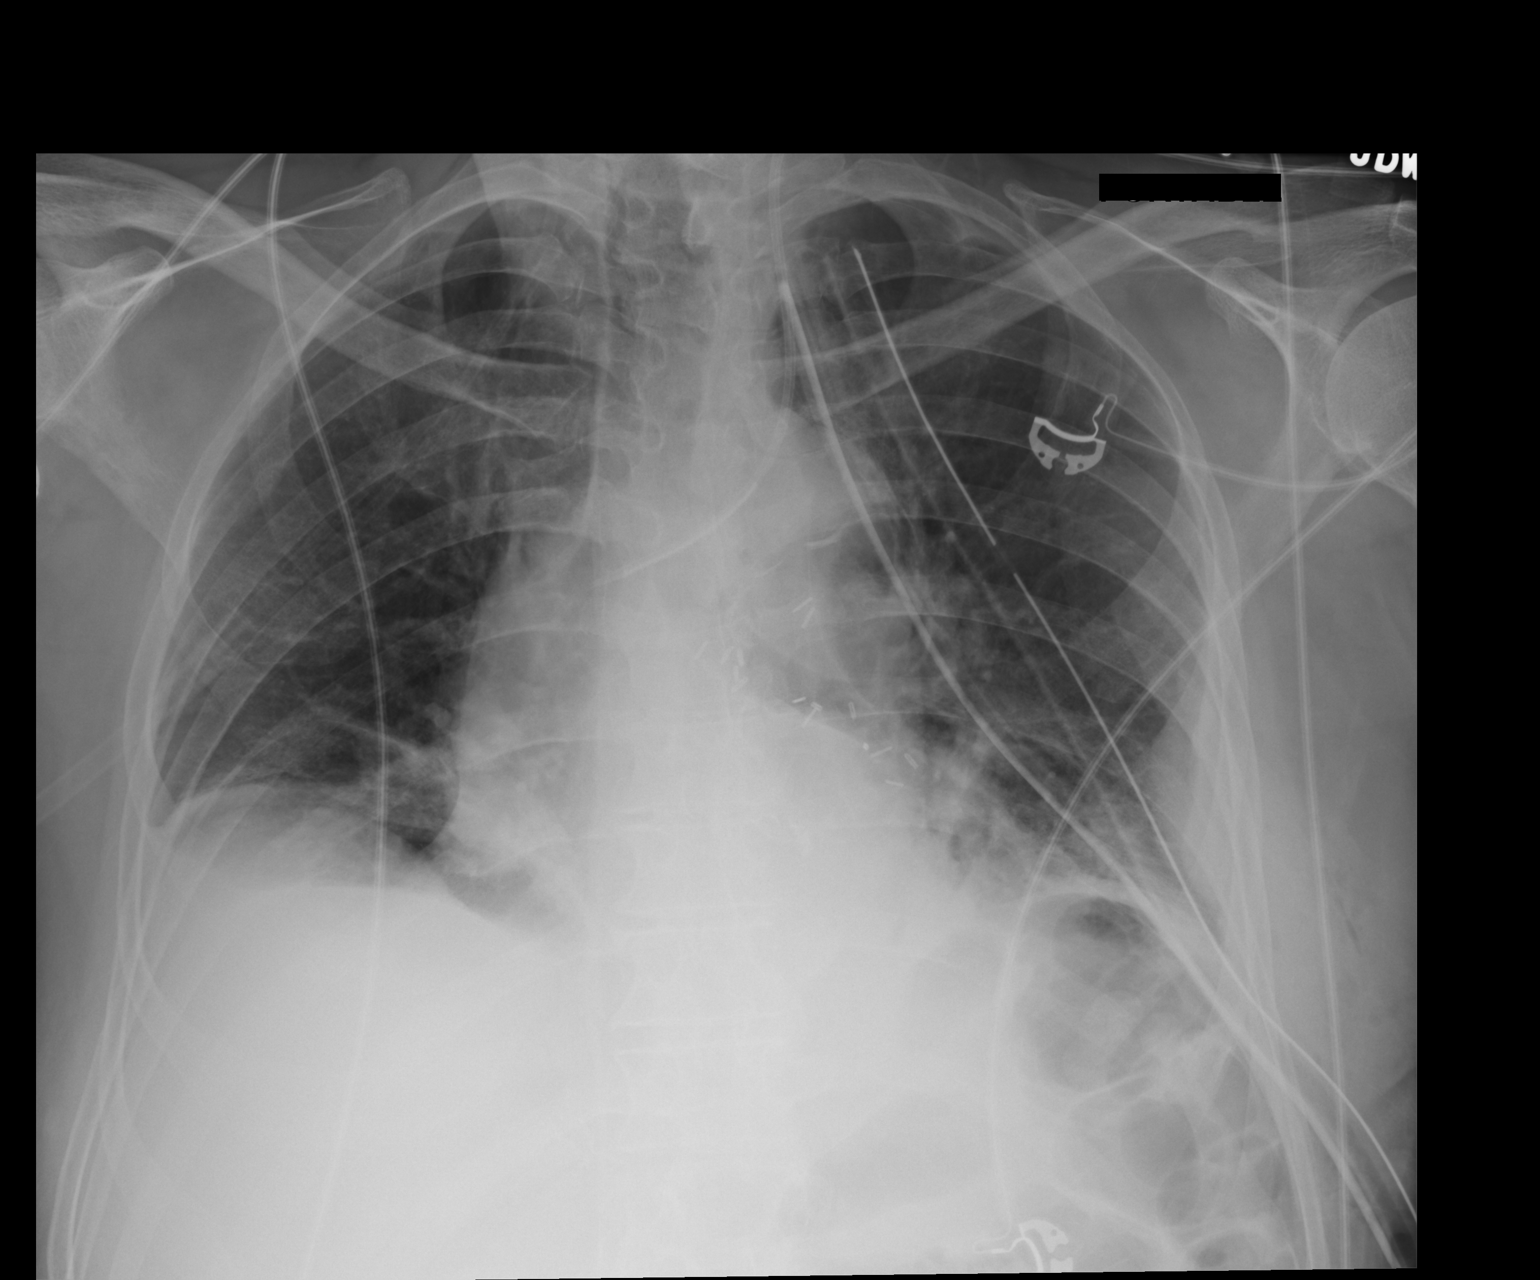

[1 of 1 positions shown; findings below may reference images not displayed]

FINDINGS: Two left chest tubes in place with no pneumothorax.
Suboptimal inspiration.  Atelectasis in the lung bases, left
greater than right, with improved aeration since yesterday.  No new
pulmonary parenchymal abnormalities.  Cardiac silhouette normal in
size.  Pulmonary vascularity normal.  Left jugular central venous
catheter tip in the upper SVC.
IMPRESSION: Support apparatus satisfactory.  No pneumothorax.  Improved
aeration in the lung bases with mild to moderate atelectasis
persisting, left greater than right.  No new abnormalities.

## 2012-07-29 ENCOUNTER — Encounter (HOSPITAL_COMMUNITY): Payer: Self-pay | Admitting: Nurse Practitioner

## 2012-07-29 ENCOUNTER — Emergency Department (HOSPITAL_COMMUNITY): Payer: Medicare Other

## 2012-07-29 ENCOUNTER — Observation Stay (HOSPITAL_COMMUNITY)
Admission: EM | Admit: 2012-07-29 | Discharge: 2012-07-30 | Disposition: A | Discharge status: Home / Self Care | Payer: Medicare Other | Attending: Internal Medicine | Admitting: Internal Medicine

## 2012-07-29 DIAGNOSIS — K5289 Other specified noninfective gastroenteritis and colitis: Principal | ICD-10-CM | POA: Insufficient documentation

## 2012-07-29 DIAGNOSIS — K529 Noninfective gastroenteritis and colitis, unspecified: Secondary | ICD-10-CM | POA: Diagnosis present

## 2012-07-29 DIAGNOSIS — M7989 Other specified soft tissue disorders: Secondary | ICD-10-CM | POA: Insufficient documentation

## 2012-07-29 DIAGNOSIS — Z23 Encounter for immunization: Secondary | ICD-10-CM | POA: Insufficient documentation

## 2012-07-29 DIAGNOSIS — F0390 Unspecified dementia without behavioral disturbance: Secondary | ICD-10-CM | POA: Diagnosis present

## 2012-07-29 DIAGNOSIS — R197 Diarrhea, unspecified: Secondary | ICD-10-CM | POA: Diagnosis present

## 2012-07-29 DIAGNOSIS — E876 Hypokalemia: Secondary | ICD-10-CM | POA: Insufficient documentation

## 2012-07-29 DIAGNOSIS — E213 Hyperparathyroidism, unspecified: Secondary | ICD-10-CM | POA: Insufficient documentation

## 2012-07-29 DIAGNOSIS — R109 Unspecified abdominal pain: Secondary | ICD-10-CM | POA: Insufficient documentation

## 2012-07-29 LAB — CBC WITH DIFFERENTIAL/PLATELET
Basophils Relative: 1 % (ref 0–1)
Eosinophils Relative: 2 % (ref 0–5)
Hemoglobin: 12.6 g/dL — ABNORMAL LOW (ref 13.0–17.0)
Lymphocytes Relative: 21 % (ref 12–46)
Monocytes Relative: 14 % — ABNORMAL HIGH (ref 3–12)
Neutrophils Relative %: 62 % (ref 43–77)
Platelets: 164 10*3/uL (ref 150–400)
RBC: 4.06 MIL/uL — ABNORMAL LOW (ref 4.22–5.81)
WBC: 6.6 10*3/uL (ref 4.0–10.5)

## 2012-07-29 LAB — COMPREHENSIVE METABOLIC PANEL
AST: 18 U/L (ref 0–37)
CO2: 22 mEq/L (ref 19–32)
Calcium: 11.1 mg/dL — ABNORMAL HIGH (ref 8.4–10.5)
Creatinine, Ser: 1.18 mg/dL (ref 0.50–1.35)
GFR calc Af Amer: 63 mL/min — ABNORMAL LOW (ref >90)
GFR calc non Af Amer: 55 mL/min — ABNORMAL LOW (ref >90)
Glucose, Bld: 103 mg/dL — ABNORMAL HIGH (ref 70–99)
Sodium: 134 mEq/L — ABNORMAL LOW (ref 135–145)
Total Protein: 6.4 g/dL (ref 6.0–8.3)

## 2012-07-29 LAB — URINALYSIS, MICROSCOPIC ONLY
Glucose, UA: NEGATIVE mg/dL
Leukocytes, UA: NEGATIVE
Protein, ur: NEGATIVE mg/dL
Specific Gravity, Urine: >1.046 — ABNORMAL HIGH (ref 1.005–1.030)
pH: 5.5 (ref 5.0–8.0)

## 2012-07-29 MED ORDER — ONDANSETRON HCL 4 MG PO TABS: 4.0000 mg | ORAL_TABLET | Freq: Four times a day (QID) | ORAL | Status: DC | PRN

## 2012-07-29 MED ORDER — LOPERAMIDE HCL 2 MG PO CAPS
2.0000 mg | ORAL_CAPSULE | ORAL | Status: DC | PRN
  Administered 2012-07-29 – 2012-07-30 (×3): 2 mg via ORAL
  Filled 2012-07-29 (×3): qty 1

## 2012-07-29 MED ORDER — METRONIDAZOLE IN NACL 5-0.79 MG/ML-% IV SOLN
500.0000 mg | Freq: Once | INTRAVENOUS | Status: AC
  Administered 2012-07-29: 500 mg via INTRAVENOUS
  Filled 2012-07-29: qty 100

## 2012-07-29 MED ORDER — ACETAMINOPHEN 650 MG RE SUPP: 650.0000 mg | Freq: Four times a day (QID) | RECTAL | Status: DC | PRN

## 2012-07-29 MED ORDER — POTASSIUM CHLORIDE CRYS ER 20 MEQ PO TBCR
40.0000 meq | EXTENDED_RELEASE_TABLET | Freq: Once | ORAL | Status: AC
  Administered 2012-07-30: 40 meq via ORAL
  Filled 2012-07-29: qty 2

## 2012-07-29 MED ORDER — LOPERAMIDE HCL 2 MG PO CAPS: 2.0000 mg | ORAL_CAPSULE | Freq: Once | ORAL | Status: DC

## 2012-07-29 MED ORDER — IOHEXOL 300 MG/ML  SOLN
100.0000 mL | Freq: Once | INTRAMUSCULAR | Status: AC | PRN
  Administered 2012-07-29: 100 mL via INTRAVENOUS

## 2012-07-29 MED ORDER — SODIUM CHLORIDE 0.9 % IV SOLN
INTRAVENOUS | Status: DC
  Administered 2012-07-29: 125 mL/h via INTRAVENOUS

## 2012-07-29 MED ORDER — ACETAMINOPHEN 325 MG PO TABS
650.0000 mg | ORAL_TABLET | Freq: Four times a day (QID) | ORAL | Status: DC | PRN
  Administered 2012-07-30: 650 mg via ORAL
  Filled 2012-07-29: qty 2

## 2012-07-29 MED ORDER — ENOXAPARIN SODIUM 40 MG/0.4ML ~~LOC~~ SOLN
40.0000 mg | SUBCUTANEOUS | Status: DC
  Filled 2012-07-29: qty 0.4

## 2012-07-29 MED ORDER — ONDANSETRON HCL 4 MG/2ML IJ SOLN: 4.0000 mg | Freq: Four times a day (QID) | INTRAMUSCULAR | Status: DC | PRN

## 2012-07-29 MED ORDER — IOHEXOL 300 MG/ML  SOLN: 20.0000 mL | INTRAMUSCULAR | Status: DC

## 2012-07-29 MED ORDER — CIPROFLOXACIN IN D5W 400 MG/200ML IV SOLN
400.0000 mg | Freq: Once | INTRAVENOUS | Status: AC
  Administered 2012-07-29: 400 mg via INTRAVENOUS
  Filled 2012-07-29: qty 200

## 2012-07-29 MED ORDER — POTASSIUM CHLORIDE CRYS ER 20 MEQ PO TBCR
40.0000 meq | EXTENDED_RELEASE_TABLET | Freq: Once | ORAL | Status: AC
  Administered 2012-07-29: 40 meq via ORAL
  Filled 2012-07-29: qty 2

## 2012-07-29 NOTE — ED Provider Notes (Signed)
History     CSN: [id number]  Arrival date & time 07/29/12  1312   First MD Initiated Contact with Patient 07/29/12 1510      Chief Complaint  Patient presents with  . Abdominal Pain    (Consider location/radiation/quality/duration/timing/severity/associated sxs/prior treatment) Patient is a 79 y.o. male presenting with abdominal pain. The history is provided by the patient.  Abdominal Pain The primary symptoms of the illness include abdominal pain.   patient here with diarrhea characterized as watery for 4 days. No fever or vomiting. Does note some abdominal cramping. Denies any recent antibiotic use or recent hospitalization. Symptoms have been persistent and nothing makes them better worse. He does note some dizziness with standing. Denies any chest pain or shortness of breath. No recent medications used. No prior history of same  Past Medical History  Diagnosis Date  . Thymoma     s/p resection  . Acute cholecystitis 11/2011    s/p perc drain 12/03/11  . Basal cell carcinoma     multiple areas  . Prostate cancer   . Stroke   . Arthritis   . Depression   . Dementia     Short term  . Head injury     Concussion- has had short term memory and it deterates .  Marland Kitchen Cholecystitis     S/P cholecytostomy tube  . Dementia   . Stroke   . Hyperparathyroidism     Past Surgical History  Procedure Date  . Prostatectomy   . Colon surgery 1994    Removal of parts of colon  . Thoracotomy 01/13/2012    Procedure: THORACOTOMY MAJOR;  Surgeon: Alleen Borne, MD;  Location: [facility] OR;  Service: Thoracic;  Laterality: Left;  LEFT THORACOTOMY, RESECTION OF THYMOMA  . Cholecystectomy 02/01/2012    Procedure: LAPAROSCOPIC CHOLECYSTECTOMY WITH INTRAOPERATIVE CHOLANGIOGRAM;  Surgeon: Ardeth Sportsman, MD;  Location: [facility];  Service: General;  Laterality: N/A;  4 port    Family History  Problem Relation Age of Onset  . Cancer Mother   . Aneurysm Mother     History  Substance Use Topics  .  Smoking status: Former Smoker    Types: Cigarettes    Quit date: 07/26/1968  . Smokeless tobacco: Never Used  . Alcohol Use: No      Review of Systems  Gastrointestinal: Positive for abdominal pain.  All other systems reviewed and are negative.    Allergies  Other  Home Medications   Current Outpatient Rx  Name  Route  Sig  Dispense  Refill  . ACETAMINOPHEN 500 MG PO TABS   Oral   Take 1,000 mg by mouth every 6 (six) hours as needed. For pain         . MAGIC MOUTHWASH   Oral   Take 15 mLs by mouth 4 (four) times daily as needed (sore throat).   15 mL   5   . BIOTENE DRY MOUTH MT LIQD   Mouth Rinse   15 mLs by Mouth Rinse route 2 (two) times daily.   1 Bottle   1   . ENSURE COMPLETE PO LIQD   Oral   Take 237 mLs by mouth 2 (two) times daily with breakfast and lunch.   1 Bottle   11   . HYDROCODONE-ACETAMINOPHEN 5-325 MG PO TABS   Oral   Take 1 tablet by mouth every 6 (six) hours as needed.         . MORPHINE SULFATE ER 15 MG  PO TBCR   Oral   Take 1 tablet (15 mg total) by mouth 2 (two) times daily.   60 tablet   0   . NYSTATIN 100000 UNIT/GM EX CREA   Topical   Apply 1 application topically 2 (two) times daily. To groin.         Marland Kitchen PHENOL 1.4 % MT LIQD   Mouth/Throat   Use as directed 2 sprays in the mouth or throat as needed.   118 mL   1   . POLYETHYLENE GLYCOL 3350 PO PACK   Oral   Take 17 g by mouth daily as needed. Constipation.         Marland Kitchen SACCHAROMYCES BOULARDII 250 MG PO CAPS   Oral   Take 250 mg by mouth 2 (two) times daily.         . TRAMADOL HCL 50 MG PO TABS   Oral   Take 1-2 tablets (50-100 mg total) by mouth every 6 (six) hours as needed for pain. Pain.   30 tablet   0   . ZOLPIDEM TARTRATE 5 MG PO TABS   Oral   Take 5 mg by mouth at bedtime as needed. Sleep.           BP 104/65  Pulse 92  Temp 98.1 F (36.7 C) (Oral)  Resp 16  SpO2 100%  Physical Exam  Nursing note and vitals reviewed. Constitutional:  He is oriented to person, place, and time. He appears well-developed and well-nourished.  Non-toxic appearance. No distress.  HENT:  Head: Normocephalic and atraumatic.  Eyes: Conjunctivae normal, EOM and lids are normal. Pupils are equal, round, and reactive to light.  Neck: Normal range of motion. Neck supple. No tracheal deviation present. No mass present.  Cardiovascular: Normal rate, regular rhythm and normal heart sounds.  Exam reveals no gallop.   No murmur heard. Pulmonary/Chest: Effort normal and breath sounds normal. No stridor. No respiratory distress. He has no decreased breath sounds. He has no wheezes. He has no rhonchi. He has no rales.  Abdominal: Soft. Normal appearance and bowel sounds are normal. He exhibits no distension. There is generalized tenderness. There is no rigidity, no rebound, no guarding and no CVA tenderness.  Musculoskeletal: Normal range of motion. He exhibits no edema and no tenderness.  Neurological: He is alert and oriented to person, place, and time. He has normal strength. No cranial nerve deficit or sensory deficit. GCS eye subscore is 4. GCS verbal subscore is 5. GCS motor subscore is 6.  Skin: Skin is warm and dry. No abrasion and no rash noted.  Psychiatric: His affect is blunt. His speech is delayed. He is slowed.    ED Course  Procedures (including critical care time)   Labs Reviewed  COMPREHENSIVE METABOLIC PANEL  LIPASE, BLOOD  CBC WITH DIFFERENTIAL  URINALYSIS, MICROSCOPIC ONLY   No results found.   No diagnosis found.    MDM  Patient given IV fluids and anti-motility agents for his diarrhea. CT scan noted and positive for colitis and he was started on antibiotics. He will be admitted by the medicine service        Toy Baker, MD 07/29/12 [phone]

## 2012-07-29 NOTE — ED Notes (Signed)
Pt c/o diarrhea x 4 days. States he is able to eat and drink, has been forcing fluids at home, but continues to have diarrhea. C/o mild abd pain. Denies n/v/fevers.

## 2012-07-29 NOTE — ED Notes (Signed)
CT called - patient finished contrast.

## 2012-07-29 NOTE — ED Notes (Signed)
Food tray given 

## 2012-07-29 NOTE — H&P (Signed)
Hospital Admission Note Date: 07/29/2012  Patient name: Jose Crawford Medical record number: [id number] Date of birth: 1926-03-05 Age: 79 y.o. Gender: male PCP: Georgann Housekeeper, MD  Medical Service: Sharen Heck  Attending physician:  Dr. Criselda Peaches   1st Contact:   Dr. Lavena Bullion Pager: [phone] 2nd Contact:   Dr. Dorise Hiss Pager:[phone] After 5 pm or weekends: 1st Contact:      Pager: [phone] 2nd Contact:      Pager: [phone]  Chief Complaint: diarrhea for several weeks  History of Present Illness: The patient is an 79 YO man with some past surgical history but no real medical history with the exception of some dementia. He has not followed with a doctor for some time (since surgery last July 2013) and has been in good health. The last several weeks he has been noticing liquid stools multiple times per day. He states that he has not noticed any blood in these stools however states he doesn't always look. He has been trying to maintain his food and fluid intake but has been having a lot of stools. He has had a couple of fevers possibly but never took his temperature. His son accompanies him to the visit and clarifies that is has been 3 weeks possibly for this diarrhea and no recent antibiotic exposure. No one has been sick around him. He has not fallen recently at home although this has been a problem in the past. He is not having nausea or vomiting. He is having some pain but it is minimal. He is not having any SOB or chest pain. He is not having cough or congestion. He is not having any other problems and takes no medications at home.  Meds: Current Outpatient Rx  Name  Route  Sig  Dispense  Refill  None on a regular basis  Allergies: Allergies as of 07/29/2012 - Review Complete 07/29/2012  Allergen Reaction Noted  . Other Other (See Comments) 12/31/2011   Past Medical History  Diagnosis Date  . Thymoma     s/p resection  . Acute cholecystitis 11/2011    s/p perc drain 12/03/11  . Basal cell  carcinoma     multiple areas  . Prostate cancer   . Stroke   . Arthritis   . Depression   . Dementia     Short term  . Head injury     Concussion- has had short term memory and it deterates .  Marland Kitchen Cholecystitis     S/P cholecytostomy tube  . Dementia   . Stroke   . Hyperparathyroidism    Past Surgical History  Procedure Date  . Prostatectomy   . Colon surgery 1994    Removal of parts of colon  . Thoracotomy 01/13/2012    Procedure: THORACOTOMY MAJOR;  Surgeon: Alleen Borne, MD;  Location: [facility] OR;  Service: Thoracic;  Laterality: Left;  LEFT THORACOTOMY, RESECTION OF THYMOMA  . Cholecystectomy 02/01/2012    Procedure: LAPAROSCOPIC CHOLECYSTECTOMY WITH INTRAOPERATIVE CHOLANGIOGRAM;  Surgeon: Ardeth Sportsman, MD;  Location: [facility];  Service: General;  Laterality: N/A;  4 port   Family History  Problem Relation Age of Onset  . Cancer Mother   . Aneurysm Mother    History   Social History  . Marital Status: Widowed    Spouse Name: N/A    Number of Children: 1  . Years of Education: N/A   Occupational History  . Retired Psychologist, sport and exercise, Airline pilot    Social History Main Topics  . Smoking  status: Former Smoker    Types: Cigarettes    Quit date: 07/26/1968  . Smokeless tobacco: Never Used  . Alcohol Use: No  . Drug Use: No  . Sexually Active: Not on file   Other Topics Concern  . Not on file   Social History Narrative   Widowed.  SNF ([facility]) resident.  Ambulates with a walker at baseline.  DNR.    Review of Systems: Pertinent items are noted in HPI. General: resting in bed, sitting up HEENT: PERRL, EOMI, no scleral icterus Cardiac: RRR, no rubs, murmurs or gallops Pulm: clear to auscultation bilaterally, moving normal volumes of air Abd: soft, mildly tender to deep palpation of left lower quadrant, nondistended, BS present Ext: warm and well perfused, no pedal edema, some minimal 1+ pitting edema in the right leg to mid calf Neuro: alert and oriented X2-3,  cranial nerves II-XII grossly intact  Physical Exam: Blood pressure 104/65, pulse 92, temperature 98.1 F (36.7 C), temperature source Oral, resp. rate 16, SpO2 100.00%.   Lab results: Basic Metabolic Panel:  Basename 07/29/12 1523  NA 134*  K 3.1*  CL 100  CO2 22  GLUCOSE 103*  BUN 18  CREATININE 1.18  CALCIUM 11.1*  MG --  PHOS --   Liver Function Tests:  Basename 07/29/12 1523  AST 18  ALT 9  ALKPHOS 55  BILITOT 0.4  PROT 6.4  ALBUMIN 3.3*    Basename 07/29/12 1523  LIPASE 13  AMYLASE --   CBC:  Basename 07/29/12 1523  WBC 6.6  NEUTROABS 4.1  HGB 12.6*  HCT 36.8*  MCV 90.6  PLT 164    Imaging results:  Ct Abdomen Pelvis W Contrast  07/29/2012  *RADIOLOGY REPORT*  Clinical Data: 4-day history of diarrhea and abdominal pain. Surgical history includes prostatectomy for prostate cancer, cholecystectomy, and prior colonic surgery.  CT ABDOMEN AND PELVIS WITH CONTRAST  Technique:  Multidetector CT imaging of the abdomen and pelvis was performed following the standard protocol during bolus administration of intravenous contrast.  Contrast: OMNIPAQUE IOHEXOL 300 MG/ML. Oral contrast was also administered.  Comparison: CT abdomen and pelvis 01/29/2012, 12/02/2011.  Findings: Numerous sub-centimeter cysts in the liver and calcified granuloma in the left lobe, unchanged; no significant focal hepatic parenchymal abnormalities.  Normal-appearing spleen and pancreas. Stable small bilateral adrenal nodules.  Gallbladder surgically absent.  No unexpected biliary ductal dilation.  Interval migration of the 8 x 9 mm calculus from the lower pole of the right kidney, this causes minimal obstruction, as there is only minimal hydronephrosis and there is no delayed contrast excretion by the right kidney.  Numerous non-obstructing bilateral renal calculi are again noted.  No obstructing left ureteral calculus. Cysts involving the left kidney as noted previously; no significant focal  parenchymal abnormalities involving either kidney.  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  No significant lymphadenopathy.  Small hiatal hernia.  Stomach otherwise normal in appearance. Normal-appearing small bowel.  Diffuse colonic diverticulosis without evidence of acute diverticulitis.  Wall thickening involving the ascending colon with associated pericolonic edema/inflammation.  Liquid stool throughout the colon consistent with the history of diarrhea.  Normal appendix in the right upper pelvis.  No ascites.  Prostate gland surgically absent.  Urinary bladder decompressed and unremarkable.  Atelectasis in the visualized right lower lobe.  Heart enlarged with biventricular enlargement.  Bone window images demonstrate degenerative changes involving the lower thoracic and lumbar spine but no definite osseous metastatic disease.  IMPRESSION:  1.  Ascending colitis. 2.  Diffuse colonic diverticulosis without evidence of acute diverticulitis. 3.  Small hiatal hernia.  4.  Mildly obstructing approximate 8 mm calculus at the right UPJ, with interval migration of this stone since the prior examination in July, 2013. 5.  Non-obstructing bilateral renal calculi. 6.  Prior prostatectomy.   Original Report Authenticated By: Hulan Saas, M.D.     Assessment & Plan by Problem:   Colitis/Diarrhea - Patient is experiencing diarrhea with some mild fevers and dehydration at home for several weeks. CT abdomen pelvis shows some diverticula and some colitis of the ascending colon with surrounding edema. Given flagyl and cipro IV in the ED and checking U/A. No risk factors for C. Dif as no recent antibiotics or hospitalizations and he lives at home. Most likely to be diverticulitis.  -Regular diet as patient is hungry and not nauseous -Likely change to cipro/flagyl PO tomorrow for 2 week course -Fluids overnight for some orthostatic symptoms   Dementia - Noted in past D/C summary and verbal  confirmation that he has some memory problems per his son and will avoid unnecessary sedatives or monitoring devices and limit in-patient days as able.    Hypercalcemia secondary to hyperparathyroidism - Appears to have mild (likely) chronic hypercalcemia. Will give saline overnight and order an ionized calcium level here. Is not high enough (>12) to warrant further treatment. It is unclear if he ever followed up after thymoma removal.   Hypokalemia- K 3.1 upon arrival and likely from diarrhea. Will replete with KCl and follow level in the AM.   Right leg swelling - given immobility lately will get duplex to rule out DVT. No calf tenderness to palpation.  Dispo: Disposition is deferred at this time, awaiting improvement of current medical problems. Anticipated discharge in approximately 1-2 day(s).   The patient does not have a current PCP Georgann Housekeeper, MD) (has not seen in some time), therefore is requiring [facility]PC follow-up after discharge.   The patient does have transportation limitations that hinder transportation to clinic appointments.  Signed: Safia Panzer 07/29/2012, 6:24 PM

## 2012-07-30 DIAGNOSIS — K5289 Other specified noninfective gastroenteritis and colitis: Secondary | ICD-10-CM

## 2012-07-30 DIAGNOSIS — M7989 Other specified soft tissue disorders: Secondary | ICD-10-CM

## 2012-07-30 LAB — COMPREHENSIVE METABOLIC PANEL
ALT: 8 U/L (ref 0–53)
AST: 17 U/L (ref 0–37)
CO2: 22 mEq/L (ref 19–32)
Calcium: 10.4 mg/dL (ref 8.4–10.5)
GFR calc non Af Amer: 60 mL/min — ABNORMAL LOW (ref >90)
Potassium: 3.4 mEq/L — ABNORMAL LOW (ref 3.5–5.1)
Sodium: 137 mEq/L (ref 135–145)

## 2012-07-30 LAB — CBC
MCH: 30.3 pg (ref 26.0–34.0)
Platelets: 239 10*3/uL (ref 150–400)
RBC: 3.56 MIL/uL — ABNORMAL LOW (ref 4.22–5.81)
WBC: 4.9 10*3/uL (ref 4.0–10.5)

## 2012-07-30 LAB — CALCIUM, IONIZED: Calcium, Ion: 1.58 mmol/L — ABNORMAL HIGH (ref 1.12–1.32)

## 2012-07-30 MED ORDER — METRONIDAZOLE 500 MG PO TABS: 500.0000 mg | ORAL_TABLET | Freq: Three times a day (TID) | ORAL | Status: DC

## 2012-07-30 MED ORDER — INFLUENZA VAC TYP A&B SURF ANT IM INJ: 0.5000 mL | INJECTION | INTRAMUSCULAR | Status: DC

## 2012-07-30 MED ORDER — INFLUENZA VAC TYP A&B SURF ANT IM INJ
0.5000 mL | INJECTION | Freq: Once | INTRAMUSCULAR | Status: AC
  Administered 2012-07-30: 0.5 mL via INTRAMUSCULAR
  Filled 2012-07-30: qty 0.5

## 2012-07-30 MED ORDER — METRONIDAZOLE 500 MG PO TABS
500.0000 mg | ORAL_TABLET | Freq: Three times a day (TID) | ORAL | Status: DC
  Administered 2012-07-30: 500 mg via ORAL
  Filled 2012-07-30 (×3): qty 1

## 2012-07-30 MED ORDER — ONDANSETRON HCL 4 MG PO TABS: 4.0000 mg | ORAL_TABLET | Freq: Three times a day (TID) | ORAL | Status: DC | PRN

## 2012-07-30 MED ORDER — CIPROFLOXACIN HCL 500 MG PO TABS
500.0000 mg | ORAL_TABLET | Freq: Two times a day (BID) | ORAL | Status: DC
  Administered 2012-07-30: 500 mg via ORAL
  Filled 2012-07-30 (×3): qty 1

## 2012-07-30 MED ORDER — ENOXAPARIN SODIUM 80 MG/0.8ML ~~LOC~~ SOLN
1.0000 mg/kg | Freq: Once | SUBCUTANEOUS | Status: AC
  Administered 2012-07-30: 75 mg via SUBCUTANEOUS
  Filled 2012-07-30: qty 0.8

## 2012-07-30 MED ORDER — CIPROFLOXACIN HCL 500 MG PO TABS: 500.0000 mg | ORAL_TABLET | Freq: Two times a day (BID) | ORAL | Status: DC

## 2012-07-30 NOTE — H&P (Addendum)
Internal Medicine Teaching Service Attending Note Date: 07/30/2012  Patient name: Jose Crawford  Medical record number: [id number]  Date of birth: August 16, 1925   I have seen and evaluated Jose Crawford and discussed their care with the Residency Team.    Jose Crawford is an 79yo man who presented to the [facility] Ed complaining of a 2 week history of loose, watery stools.  He notes that he has been having about 3 bowel movements per day that are watery, while his normal daily bowel movements are 2 and well formed.  He has been eating and drinking okay and complaining of some mild intermittent nausea without vomiting.  He denies heartburn, fever, chills or blood in the stools, though he did not always check.  He denies recent sick contacts or recent Abx use.     He also has complaints of LE swelling that has been going on for the past couple of months.  He notes that his LE is swollen and warm, worse on the right, and occasionally hurts with walking.  He has not been immobile or had recent surgery.    He also has a large lesion on his scalp which is multi-colored, raised and ~ 2 cm X 1cm.  He says it has been there and slowly growing for about 2 years.  He has an appointment to have it excised this coming week, he thinks.   He denies chest pain, SOB, and other symptoms at this time.  The history was obtained from the patient, no family was present in the room.    For further PMH, PSH, PFSH, meds, allergies and ROS please see resident note.   Physical Exam: Blood pressure 112/69, pulse 72, temperature 98.4 F (36.9 C), temperature source Oral, resp. rate 18, height 6\' 1"  (1.854 m), weight 168 lb (76.204 kg), SpO2 92.00%. General appearance: alert, cooperative and no distress Head: atraumatic, large skin lesion to posterior scalp, mutli-colored, raised Eyes: EOMI, anicteric, wearing glasses Lungs: crackles at bases, otherwise clear, no wheezes Heart: RR, NR, no murmur Abdomen: mild tenderness to  palpation, +BS throughout Extremities: bilateral LE swelling, right > left.  On the left, swelling to ankles, + varicose veins, non tender.  On the right, swelling to mid calf 1-2+, non tender to palpation, mild erythema (diffuse)  Pulses:2+ on the left DP, 1+ on the right DP, difficult to palpate due to edema.  2+ and equal radially Skin: skin is thin, + varicose veins in LE L> R.  Skin lesion to scalp, multicolored, raised, 2cm X 1cm  Lab results: Results for orders placed during the hospital encounter of 07/29/12 (from the past 24 hour(s))  COMPREHENSIVE METABOLIC PANEL     Status: Abnormal   Collection Time   07/29/12  3:23 PM      Component Value Range   Sodium 134 (*) 135 - 145 mEq/L   Potassium 3.1 (*) 3.5 - 5.1 mEq/L   Chloride 100  96 - 112 mEq/L   CO2 22  19 - 32 mEq/L   Glucose, Bld 103 (*) 70 - 99 mg/dL   BUN 18  6 - 23 mg/dL   Creatinine, Ser 8.65  0.50 - 1.35 mg/dL   Calcium 78.4 (*) 8.4 - 10.5 mg/dL   Total Protein 6.4  6.0 - 8.3 g/dL   Albumin 3.3 (*) 3.5 - 5.2 g/dL   AST 18  0 - 37 U/L   ALT 9  0 - 53 U/L   Alkaline Phosphatase 55  39 - 117 U/L   Total Bilirubin 0.4  0.3 - 1.2 mg/dL   GFR calc non Af Amer 55 (*) >90 mL/min   GFR calc Af Amer 63 (*) >90 mL/min  LIPASE, BLOOD     Status: Normal   Collection Time   07/29/12  3:23 PM      Component Value Range   Lipase 13  11 - 59 U/L  CBC WITH DIFFERENTIAL     Status: Abnormal   Collection Time   07/29/12  3:23 PM      Component Value Range   WBC 6.6  4.0 - 10.5 K/uL   RBC 4.06 (*) 4.22 - 5.81 MIL/uL   Hemoglobin 12.6 (*) 13.0 - 17.0 g/dL   HCT 19.1 (*) 47.8 - 29.5 %   MCV 90.6  78.0 - 100.0 fL   MCH 31.0  26.0 - 34.0 pg   MCHC 34.2  30.0 - 36.0 g/dL   RDW 62.1  30.8 - 65.7 %   Platelets 164  150 - 400 K/uL   Neutrophils Relative 62  43 - 77 %   Lymphocytes Relative 21  12 - 46 %   Monocytes Relative 14 (*) 3 - 12 %   Eosinophils Relative 2  0 - 5 %   Basophils Relative 1  0 - 1 %   Neutro Abs 4.1  1.7 - 7.7  K/uL   Lymphs Abs 1.4  0.7 - 4.0 K/uL   Monocytes Absolute 0.9  0.1 - 1.0 K/uL   Eosinophils Absolute 0.1  0.0 - 0.7 K/uL   Basophils Absolute 0.1  0.0 - 0.1 K/uL  URINALYSIS, MICROSCOPIC ONLY     Status: Abnormal   Collection Time   07/29/12  6:46 PM      Component Value Range   Color, Urine YELLOW  YELLOW   APPearance CLEAR  CLEAR   Specific Gravity, Urine >1.046 (*) 1.005 - 1.030   pH 5.5  5.0 - 8.0   Glucose, UA NEGATIVE  NEGATIVE mg/dL   Hgb urine dipstick NEGATIVE  NEGATIVE   Bilirubin Urine NEGATIVE  NEGATIVE   Ketones, ur 15 (*) NEGATIVE mg/dL   Protein, ur NEGATIVE  NEGATIVE mg/dL   Urobilinogen, UA 0.2  0.0 - 1.0 mg/dL   Nitrite NEGATIVE  NEGATIVE   Leukocytes, UA NEGATIVE  NEGATIVE   Squamous Epithelial / LPF RARE  RARE  CBC     Status: Abnormal   Collection Time   07/30/12  5:00 AM      Component Value Range   WBC 4.9  4.0 - 10.5 K/uL   RBC 3.56 (*) 4.22 - 5.81 MIL/uL   Hemoglobin 10.8 (*) 13.0 - 17.0 g/dL   HCT 84.6 (*) 96.2 - 95.2 %   MCV 89.9  78.0 - 100.0 fL   MCH 30.3  26.0 - 34.0 pg   MCHC 33.8  30.0 - 36.0 g/dL   RDW 84.1  32.4 - 40.1 %   Platelets 239  150 - 400 K/uL  COMPREHENSIVE METABOLIC PANEL     Status: Abnormal   Collection Time   07/30/12  5:00 AM      Component Value Range   Sodium 137  135 - 145 mEq/L   Potassium 3.4 (*) 3.5 - 5.1 mEq/L   Chloride 105  96 - 112 mEq/L   CO2 22  19 - 32 mEq/L   Glucose, Bld 87  70 - 99 mg/dL   BUN 14  6 - 23  mg/dL   Creatinine, Ser 0.98  0.50 - 1.35 mg/dL   Calcium 11.9  8.4 - 14.7 mg/dL   Total Protein 5.5 (*) 6.0 - 8.3 g/dL   Albumin 2.9 (*) 3.5 - 5.2 g/dL   AST 17  0 - 37 U/L   ALT 8  0 - 53 U/L   Alkaline Phosphatase 52  39 - 117 U/L   Total Bilirubin 0.3  0.3 - 1.2 mg/dL   GFR calc non Af Amer 60 (*) >90 mL/min   GFR calc Af Amer 69 (*) >90 mL/min    Imaging results:  Ct Abdomen Pelvis W Contrast  07/29/2012  *RADIOLOGY REPORT*  Clinical Data: 4-day history of diarrhea and abdominal pain. Surgical  history includes prostatectomy for prostate cancer, cholecystectomy, and prior colonic surgery.  CT ABDOMEN AND PELVIS WITH CONTRAST  Technique:  Multidetector CT imaging of the abdomen and pelvis was performed following the standard protocol during bolus administration of intravenous contrast.  Contrast: OMNIPAQUE IOHEXOL 300 MG/ML. Oral contrast was also administered.  Comparison: CT abdomen and pelvis 01/29/2012, 12/02/2011.  Findings: Numerous sub-centimeter cysts in the liver and calcified granuloma in the left lobe, unchanged; no significant focal hepatic parenchymal abnormalities.  Normal-appearing spleen and pancreas. Stable small bilateral adrenal nodules.  Gallbladder surgically absent.  No unexpected biliary ductal dilation.  Interval migration of the 8 x 9 mm calculus from the lower pole of the right kidney, this causes minimal obstruction, as there is only minimal hydronephrosis and there is no delayed contrast excretion by the right kidney.  Numerous non-obstructing bilateral renal calculi are again noted.  No obstructing left ureteral calculus. Cysts involving the left kidney as noted previously; no significant focal parenchymal abnormalities involving either kidney.  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  No significant lymphadenopathy.  Small hiatal hernia.  Stomach otherwise normal in appearance. Normal-appearing small bowel.  Diffuse colonic diverticulosis without evidence of acute diverticulitis.  Wall thickening involving the ascending colon with associated pericolonic edema/inflammation.  Liquid stool throughout the colon consistent with the history of diarrhea.  Normal appendix in the right upper pelvis.  No ascites.  Prostate gland surgically absent.  Urinary bladder decompressed and unremarkable.  Atelectasis in the visualized right lower lobe.  Heart enlarged with biventricular enlargement.  Bone window images demonstrate degenerative changes involving the  lower thoracic and lumbar spine but no definite osseous metastatic disease.  IMPRESSION:  1.  Ascending colitis. 2.  Diffuse colonic diverticulosis without evidence of acute diverticulitis. 3.  Small hiatal hernia.  4.  Mildly obstructing approximate 8 mm calculus at the right UPJ, with interval migration of this stone since the prior examination in July, 2013. 5.  Non-obstructing bilateral renal calculi. 6.  Prior prostatectomy.   Original Report Authenticated By: Hulan Saas, M.D.     Assessment and Plan: I agree with the formulated Assessment and Plan with the following changes:   1. Acute ascending colitis - CT results as above - Possibly this was an infectious diarrhea that worsened to a colitis.   - Treat with Cipro/Flagyl, switch to PO when patient tolerates - IVF until taking good PO - Likely will need 10-14 days of therapy, if diarrhea not improved at that point, would likely need stool cultures to evaluate for particular pathogen.  Can consider stool cultures now, but likely would not change current therapy.  Patient states he is doing better this AM  2. LE swelling - [place] to evaluate for DVT - Full dose lovenox today,  d/c if no DVT noted - Patient with history of recently diagnosed thymoma (11/2011) as a risk factor  3. Hypokalemia - Replete   4. 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   Inez Catalina, MD 1/5/20148:48 AM

## 2012-07-30 NOTE — Progress Notes (Signed)
Subjective:    Patient states he feels well this AM and is ready to go home. Denies any diarrhea this AM. Denies N/V. Denies CP/SOB. Pt states he has f/u within the next week for his scalp BCC.   Interval Events: No acute events.   Objective:    Vital Signs:   Temp:  [98.1 F (36.7 C)-98.6 F (37 C)] 98.4 F (36.9 C) (01/05 0512) Pulse Rate:  [48-117] 72  (01/05 0512) Resp:  [16-18] 18  (01/05 0512) BP: (100-126)/(57-75) 112/69 mmHg (01/05 0512) SpO2:  [90 %-100 %] 92 % (01/05 0512) Weight:  [168 lb (76.204 kg)] 168 lb (76.204 kg) (01/05 0100) Last BM Date: 07/30/12  24-hour weight change: Weight change:   Intake/Output:  No intake or output data in the 24 hours ending 07/30/12 1043    Physical Exam: General: resting in bed, sitting up  HEENT: PERRL, EOMI, no scleral icterus  Cardiac: RRR, no rubs, murmurs or gallops  Pulm: clear to auscultation bilaterally, moving normal volumes of air  Abd: soft, nontender, nondistended, BS present  Ext: warm and well perfused, no pedal edema, some minimal 1+ pitting edema in the right leg to mid calf  Neuro: alert and oriented X2-3, cranial nerves II-XII grossly intact Skin: ~2cm dark-colored growth on midline scalp  Labs:  Basic Metabolic Panel:  Lab 07/30/12 1610 07/29/12 1523  NA 137 134*  K 3.4* 3.1*  CL 105 100  CO2 22 22  GLUCOSE 87 103*  BUN 14 18  CREATININE 1.10 1.18  CALCIUM 10.4 11.1*  MG -- --  PHOS -- --    Liver Function Tests:  Lab 07/30/12 0500 07/29/12 1523  AST 17 18  ALT 8 9  ALKPHOS 52 55  BILITOT 0.3 0.4  PROT 5.5* 6.4  ALBUMIN 2.9* 3.3*    Lab 07/29/12 1523  LIPASE 13  AMYLASE --   CBC:  Lab 07/30/12 0500 07/29/12 1523  WBC 4.9 6.6  NEUTROABS -- 4.1  HGB 10.8* 12.6*  HCT 32.0* 36.8*  MCV 89.9 90.6  PLT 239 164    Microbiology: Results for orders placed during the hospital encounter of 01/29/12  CULTURE, BLOOD (ROUTINE X 2)     Status: Normal   Collection Time   01/29/12   2:22 PM      Component Value Range Status Comment   Specimen Description BLOOD LEFT IV SITE   Final    Special Requests BOTTLES DRAWN AEROBIC AND ANAEROBIC 5 CC EACH   Final    Culture  Setup Time 01/29/2012 18:35   Final    Culture NO GROWTH 5 DAYS   Final    Report Status 02/04/2012 FINAL   Final   URINE CULTURE     Status: Normal   Collection Time   01/29/12  2:44 PM      Component Value Range Status Comment   Specimen Description URINE, CATHETERIZED   Final    Special Requests NONE   Final    Culture  Setup Time 01/29/2012 18:34   Final    Colony Count 2,000 COLONIES/ML   Final    Culture ESCHERICHIA COLI   Final    Report Status 01/31/2012 FINAL   Final    Organism ID, Bacteria ESCHERICHIA COLI   Final   CULTURE, BLOOD (ROUTINE X 2)     Status: Normal   Collection Time   01/29/12  3:26 PM      Component Value Range Status Comment   Specimen Description  BLOOD RIGHT FOREARM   Final    Special Requests BOTTLES DRAWN AEROBIC ONLY 1.5 CC   Final    Culture  Setup Time 01/29/2012 18:35   Final    Culture NO GROWTH 5 DAYS   Final    Report Status 02/04/2012 FINAL   Final   MRSA PCR SCREENING     Status: Normal   Collection Time   01/30/12 10:35 AM      Component Value Range Status Comment   MRSA by PCR NEGATIVE  NEGATIVE Final   CULTURE, ROUTINE-ABSCESS     Status: Normal   Collection Time   01/31/12  1:24 PM      Component Value Range Status Comment   Specimen Description PERITONEAL CAVITY   Final    Special Requests Normal   Final    Gram Stain     Final    Value: MODERATE WBC PRESENT,BOTH PMN AND MONONUCLEAR     NO SQUAMOUS EPITHELIAL CELLS SEEN     RARE GRAM NEGATIVE RODS   Culture MODERATE ESCHERICHIA COLI   Final    Report Status 02/03/2012 FINAL   Final    Organism ID, Bacteria ESCHERICHIA COLI   Final   ANAEROBIC CULTURE     Status: Normal   Collection Time   01/31/12  1:24 PM      Component Value Range Status Comment   Specimen Description PERITONEAL CAVITY   Final     Special Requests Normal   Final    Gram Stain     Final    Value: ABUNDANT WBC PRESENT, PREDOMINANTLY PMN     NO SQUAMOUS EPITHELIAL CELLS SEEN     RARE GRAM NEGATIVE RODS   Culture NO ANAEROBES ISOLATED   Final    Report Status 02/05/2012 FINAL   Final   SURGICAL PCR SCREEN     Status: Abnormal   Collection Time   02/01/12 12:29 PM      Component Value Range Status Comment   MRSA, PCR NEGATIVE  NEGATIVE Final    Staphylococcus aureus POSITIVE (*) NEGATIVE Final     Imaging: Ct Abdomen Pelvis W Contrast  07/29/2012  *RADIOLOGY REPORT*  Clinical Data: 4-day history of diarrhea and abdominal pain. Surgical history includes prostatectomy for prostate cancer, cholecystectomy, and prior colonic surgery.  CT ABDOMEN AND PELVIS WITH CONTRAST  Technique:  Multidetector CT imaging of the abdomen and pelvis was performed following the standard protocol during bolus administration of intravenous contrast.  Contrast: OMNIPAQUE IOHEXOL 300 MG/ML. Oral contrast was also administered.  Comparison: CT abdomen and pelvis 01/29/2012, 12/02/2011.  Findings: Numerous sub-centimeter cysts in the liver and calcified granuloma in the left lobe, unchanged; no significant focal hepatic parenchymal abnormalities.  Normal-appearing spleen and pancreas. Stable small bilateral adrenal nodules.  Gallbladder surgically absent.  No unexpected biliary ductal dilation.  Interval migration of the 8 x 9 mm calculus from the lower pole of the right kidney, this causes minimal obstruction, as there is only minimal hydronephrosis and there is no delayed contrast excretion by the right kidney.  Numerous non-obstructing bilateral renal calculi are again noted.  No obstructing left ureteral calculus. Cysts involving the left kidney as noted previously; no significant focal parenchymal abnormalities involving either kidney.  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  No significant lymphadenopathy.  Small hiatal  hernia.  Stomach otherwise normal in appearance. Normal-appearing small bowel.  Diffuse colonic diverticulosis without evidence of acute diverticulitis.  Wall thickening involving the ascending colon with associated pericolonic  edema/inflammation.  Liquid stool throughout the colon consistent with the history of diarrhea.  Normal appendix in the right upper pelvis.  No ascites.  Prostate gland surgically absent.  Urinary bladder decompressed and unremarkable.  Atelectasis in the visualized right lower lobe.  Heart enlarged with biventricular enlargement.  Bone window images demonstrate degenerative changes involving the lower thoracic and lumbar spine but no definite osseous metastatic disease.  IMPRESSION:  1.  Ascending colitis. 2.  Diffuse colonic diverticulosis without evidence of acute diverticulitis. 3.  Small hiatal hernia.  4.  Mildly obstructing approximate 8 mm calculus at the right UPJ, with interval migration of this stone since the prior examination in July, 2013. 5.  Non-obstructing bilateral renal calculi. 6.  Prior prostatectomy.   Original Report Authenticated By: Hulan Saas, M.D.        Medications:    Infusions:    . sodium chloride 125 mL/hr (07/29/12 1602)    Scheduled Medications:    . ciprofloxacin  500 mg Oral BID  . metroNIDAZOLE  500 mg Oral Q8H    PRN Medications: acetaminophen, acetaminophen, loperamide, ondansetron (ZOFRAN) IV, ondansetron   Assessment/ Plan:   Colitis/Diarrhea - No complaints this AM. No diarrhea.  - d/c on cipro/flagyl x 14 days  Dementia - Noted in past D/C summary and verbal confirmation that he has some memory problems per his son and will avoid unnecessary sedatives or monitoring devices and limit in-patient days as able.   Hypercalcemia secondary to hyperparathyroidism - Appears to have mild (likely) chronic hypercalcemia. Will give saline overnight and order an ionized calcium level here. Is not high enough (>12) to warrant  further treatment. It is unclear if he ever followed up after thymoma removal.   Hypokalemia- K = 3.4 this AM after repletion. This issue should continue to improve with treatment of the patient's diarrhea, per above.  Right leg swelling - Exam less impressive this AM, with LE more symmetric. Pt received 1 dose therapeutic lovenox before doppler RLE was performed, which was negative for DVT.  BCC - patient states that his scalp BCC will be excised this week.   Dispo - pt will be discharged on cipro/flagyl, per above. He will f/u with the [facility] in 1-2 weeks.  The patient does have transportation limitations that hinder transportation to clinic appointments.    DVT PPX - lovenox  CODE STATUS - full  CONSULTS PLACED - N/A   SERVICE NEEDED AT DISCHARGE - TO BE DETERMINED DURING HOSPITAL COURSE         Y = Yes, Blank = No PT:   OT:   RN:   Equipment:   Other:      Length of Stay: 1 day(s)   Signed: Elfredia Nevins, MD  PGY-1, Internal Medicine Resident Pager: [phone] (7AM-5PM) 07/30/2012, 10:43 AM

## 2012-07-30 NOTE — Progress Notes (Signed)
Pt for discharge.  Pt given flu vaccine per order prior to discharge.  Pt's son in law informed that Rx were called into pharmacy listed ([facility]).  All discharge instructions reviewed w/ family and copy given.  No further questions verbalized about home care, to be discharged to dtr and son in law's home where pt was living PTA.

## 2012-07-30 NOTE — Progress Notes (Signed)
VASCULAR LAB PRELIMINARY  PRELIMINARY  PRELIMINARY  PRELIMINARY  Right lower extremity venous Doppler completed.    Preliminary report:  There is no DVT or SVT noted in the right lower extremity.  Takako Minckler, RVT 07/30/2012, 8:47 AM

## 2012-08-06 NOTE — Discharge Summary (Signed)
Patient Name:  Jose Crawford MRN: [id number]  PCP: Georgann Housekeeper, MD DOB:  16-Aug-1925       Date of Admission:  07/29/2012  Date of Discharge:  07/30/2012     Attending Physician: Dr. Debe Coder         DISCHARGE DIAGNOSES: Colitis/Diarrhea Dementia Hypercalcemia secondary to hyperparathyroidism Hypokalemia Right leg swelling   DISPOSITION AND FOLLOW-UP: Jose Crawford is to follow-up with the listed providers as detailed below, at which time, the following should be addressed:   1. F/u on diarrheal symptoms.  2. Recheck BMET to assess resolution of hypokalemia 3. F/u on hypercalcemia (likely primary hyperparathyroidism) 4. Labs / imaging needed: BMET 5. Pending labs/ test needing follow-up: N/A  Follow-up Information    Follow up with [facility]. (Please call ASAP to make an appointment if you are not called with an appointment time by tomorrow afternoon. Directions to clinic are attached. )    Contact information:   [address] [place] [address] [phone]        Discharge Orders    Future Appointments: Provider: Department: Dept Phone: Center:   08/10/2012 9:45 AM Larey Seat, MD [facility] [phone] [facility]     Future Orders Please Complete By Expires   Diet - low sodium heart healthy      Increase activity slowly          DISCHARGE MEDICATIONS:   Medication List     As of 08/06/2012  8:34 AM    TAKE these medications         acetaminophen 500 MG tablet   Commonly known as: TYLENOL   Take 1,000 mg by mouth every 6 (six) hours as needed. For pain      ciprofloxacin 500 MG tablet   Commonly known as: CIPRO   Take 1 tablet (500 mg total) by mouth 2 (two) times daily.      metroNIDAZOLE 500 MG tablet   Commonly known as: FLAGYL   Take 1 tablet (500 mg total) by mouth every 8 (eight) hours.      ondansetron 4 MG tablet   Commonly known as: ZOFRAN   Take 1 tablet (4  mg total) by mouth every 8 (eight) hours as needed for nausea.      zolpidem 5 MG tablet   Commonly known as: AMBIEN   Take 5 mg by mouth at bedtime as needed. Sleep.         CONSULTS:       PROCEDURES PERFORMED:  Ct Abdomen Pelvis W Contrast  07/29/2012  *RADIOLOGY REPORT*  Clinical Data: 4-day history of diarrhea and abdominal pain. Surgical history includes prostatectomy for prostate cancer, cholecystectomy, and prior colonic surgery.  CT ABDOMEN AND PELVIS WITH CONTRAST  Technique:  Multidetector CT imaging of the abdomen and pelvis was performed following the standard protocol during bolus administration of intravenous contrast.  Contrast: OMNIPAQUE IOHEXOL 300 MG/ML. Oral contrast was also administered.  Comparison: CT abdomen and pelvis 01/29/2012, 12/02/2011.  Findings: Numerous sub-centimeter cysts in the liver and calcified granuloma in the left lobe, unchanged; no significant focal hepatic parenchymal abnormalities.  Normal-appearing spleen and pancreas. Stable small bilateral adrenal nodules.  Gallbladder surgically absent.  No unexpected biliary ductal dilation.  Interval migration of the 8 x 9 mm calculus from the lower pole of the right kidney, this causes minimal obstruction, as there is only minimal hydronephrosis and there is no  delayed contrast excretion by the right kidney.  Numerous non-obstructing bilateral renal calculi are again noted.  No obstructing left ureteral calculus. Cysts involving the left kidney as noted previously; no significant focal parenchymal abnormalities involving either kidney.  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  No significant lymphadenopathy.  Small hiatal hernia.  Stomach otherwise normal in appearance. Normal-appearing small bowel.  Diffuse colonic diverticulosis without evidence of acute diverticulitis.  Wall thickening involving the ascending colon with associated pericolonic edema/inflammation.  Liquid stool  throughout the colon consistent with the history of diarrhea.  Normal appendix in the right upper pelvis.  No ascites.  Prostate gland surgically absent.  Urinary bladder decompressed and unremarkable.  Atelectasis in the visualized right lower lobe.  Heart enlarged with biventricular enlargement.  Bone window images demonstrate degenerative changes involving the lower thoracic and lumbar spine but no definite osseous metastatic disease.  IMPRESSION:  1.  Ascending colitis. 2.  Diffuse colonic diverticulosis without evidence of acute diverticulitis. 3.  Small hiatal hernia.  4.  Mildly obstructing approximate 8 mm calculus at the right UPJ, with interval migration of this stone since the prior examination in July, 2013. 5.  Non-obstructing bilateral renal calculi. 6.  Prior prostatectomy.   Original Report Authenticated By: Hulan Saas, M.D.        ADMISSION DATA: H&P: The patient is an 79 YO man with some past surgical history but no real medical history with the exception of some dementia. He has not followed with a doctor for some time (since surgery last July 2013) and has been in good health. The last several weeks he has been noticing liquid stools multiple times per day. He states that he has not noticed any blood in these stools however states he doesn't always look. He has been trying to maintain his food and fluid intake but has been having a lot of stools. He has had a couple of fevers possibly but never took his temperature. His son accompanies him to the visit and clarifies that is has been 3 weeks possibly for this diarrhea and no recent antibiotic exposure. No one has been sick around him. He has not fallen recently at home although this has been a problem in the past. He is not having nausea or vomiting. He is having some pain but it is minimal. He is not having any SOB or chest pain. He is not having cough or congestion. He is not having any other problems and takes no medications at  home.  Physical Exam: Pertinent items are noted in HPI.  General: resting in bed, sitting up  HEENT: PERRL, EOMI, no scleral icterus  Cardiac: RRR, no rubs, murmurs or gallops  Pulm: clear to auscultation bilaterally, moving normal volumes of air  Abd: soft, mildly tender to deep palpation of left lower quadrant, nondistended, BS present  Ext: warm and well perfused, no pedal edema, some minimal 1+ pitting edema in the right leg to mid calf  Neuro: alert and oriented X2-3, cranial nerves II-XII grossly intact  Labs: Basic Metabolic Panel:   Basename  07/29/12 1523   NA  134*   K  3.1*   CL  100   CO2  22   GLUCOSE  103*   BUN  18   CREATININE  1.18   CALCIUM  11.1*   MG  --   PHOS  --    Liver Function Tests:   Basename  07/29/12 1523   AST  18   ALT  9  ALKPHOS  55   BILITOT  0.4   PROT  6.4   ALBUMIN  3.3*     Basename  07/29/12 1523   LIPASE  13   AMYLASE  --    CBC:   Basename  07/29/12 1523   WBC  6.6   NEUTROABS  4.1   HGB  12.6*   HCT  36.8*   MCV  90.6   PLT  164      HOSPITAL COURSE: Colitis/Diarrhea - Patient was experiencing diarrhea with some mild fevers and dehydration at home for several weeks. CT abdomen pelvis shows some diverticula and some colitis of the ascending colon with surrounding edema.  No risk factors for C. Dif as no recent antibiotics or hospitalizations and he lives at home. Patient felt much better after rehydration with IVF, and had no further complaints of diarrhea. The issue may be secondary to diverticulitis or a nonspecific colitis involving the ascending colon (as seen on CT)--patient was discharged with a 2 week course of cipro/flagyl and instructed to advance his diet as tolerated.   Hypercalcemia secondary to hyperparathyroidism - Appears to have chronic hypercalcemia (~10.5-11). Ionized calcium was elevated at 1.68. PTH in 11/2011 elevated at ~150. PTHrP wnl in 11/2011. TSH wnl in 2009. Pt has history of thymoma s/p  resection. Appears most likely that pt has primary hyperparathyroidism--this issue will require further investigation after pt's acute illness is resolved.    Dementia - stable. No delirium during course.   Hypokalemia- K 3.1 upon arrival and likely from diarrhea, improved to 3.4 after repletion. Recheck at time of hospital f/u visit.   RLE swelling - LE more symmetric after rehydration with IVF. Doppler US was negative for DVT.    DISCHARGE DATA: Vital Signs: BP 112/69  Pulse 72  Temp 98.4 F (36.9 C) (Oral)  Resp 18  Ht 6\' 1"  (1.854 m)  Wt 168 lb (76.204 kg)  BMI 22.16 kg/m2  SpO2 92%   Time spent on discharge: 40 minutes  Services Ordered on Discharge: Y = Yes; Blank = No PT:   OT:   RN:   Equipment:   Other:     Signed: Elfredia Nevins, MD   PGY 1, Internal Medicine Resident 08/06/2012, 8:34 AM

## 2012-08-10 ENCOUNTER — Ambulatory Visit: Payer: Medicare Other | Admitting: Internal Medicine

## 2012-09-26 ENCOUNTER — Emergency Department (HOSPITAL_COMMUNITY): Payer: Medicare Other

## 2012-09-26 ENCOUNTER — Emergency Department (HOSPITAL_COMMUNITY)
Admission: EM | Admit: 2012-09-26 | Discharge: 2012-09-26 | Disposition: A | Discharge status: Home / Self Care | Payer: Medicare Other | Attending: Emergency Medicine | Admitting: Emergency Medicine

## 2012-09-26 ENCOUNTER — Encounter (HOSPITAL_COMMUNITY): Payer: Self-pay | Admitting: *Deleted

## 2012-09-26 DIAGNOSIS — Z8673 Personal history of transient ischemic attack (TIA), and cerebral infarction without residual deficits: Secondary | ICD-10-CM | POA: Insufficient documentation

## 2012-09-26 DIAGNOSIS — Z8659 Personal history of other mental and behavioral disorders: Secondary | ICD-10-CM | POA: Insufficient documentation

## 2012-09-26 DIAGNOSIS — F0390 Unspecified dementia without behavioral disturbance: Secondary | ICD-10-CM | POA: Insufficient documentation

## 2012-09-26 DIAGNOSIS — R059 Cough, unspecified: Secondary | ICD-10-CM | POA: Insufficient documentation

## 2012-09-26 DIAGNOSIS — Z8739 Personal history of other diseases of the musculoskeletal system and connective tissue: Secondary | ICD-10-CM | POA: Insufficient documentation

## 2012-09-26 DIAGNOSIS — Z8709 Personal history of other diseases of the respiratory system: Secondary | ICD-10-CM | POA: Insufficient documentation

## 2012-09-26 DIAGNOSIS — Z8719 Personal history of other diseases of the digestive system: Secondary | ICD-10-CM | POA: Insufficient documentation

## 2012-09-26 DIAGNOSIS — Z8529 Personal history of malignant neoplasm of other respiratory and intrathoracic organs: Secondary | ICD-10-CM | POA: Insufficient documentation

## 2012-09-26 DIAGNOSIS — Z8546 Personal history of malignant neoplasm of prostate: Secondary | ICD-10-CM | POA: Insufficient documentation

## 2012-09-26 DIAGNOSIS — Z87891 Personal history of nicotine dependence: Secondary | ICD-10-CM | POA: Insufficient documentation

## 2012-09-26 DIAGNOSIS — Z9889 Other specified postprocedural states: Secondary | ICD-10-CM | POA: Insufficient documentation

## 2012-09-26 DIAGNOSIS — Z85828 Personal history of other malignant neoplasm of skin: Secondary | ICD-10-CM | POA: Insufficient documentation

## 2012-09-26 DIAGNOSIS — Z862 Personal history of diseases of the blood and blood-forming organs and certain disorders involving the immune mechanism: Secondary | ICD-10-CM | POA: Insufficient documentation

## 2012-09-26 DIAGNOSIS — Z87828 Personal history of other (healed) physical injury and trauma: Secondary | ICD-10-CM | POA: Insufficient documentation

## 2012-09-26 DIAGNOSIS — Z8639 Personal history of other endocrine, nutritional and metabolic disease: Secondary | ICD-10-CM | POA: Insufficient documentation

## 2012-09-26 LAB — COMPREHENSIVE METABOLIC PANEL
ALT: 10 U/L (ref 0–53)
AST: 18 U/L (ref 0–37)
Albumin: 3.9 g/dL (ref 3.5–5.2)
Alkaline Phosphatase: 62 U/L (ref 39–117)
CO2: 27 mEq/L (ref 19–32)
Chloride: 105 mEq/L (ref 96–112)
Creatinine, Ser: 1.05 mg/dL (ref 0.50–1.35)
Potassium: 3.4 mEq/L — ABNORMAL LOW (ref 3.5–5.1)
Sodium: 141 mEq/L (ref 135–145)
Total Bilirubin: 0.4 mg/dL (ref 0.3–1.2)

## 2012-09-26 LAB — CBC WITH DIFFERENTIAL/PLATELET
Basophils Absolute: 0 10*3/uL (ref 0.0–0.1)
Basophils Relative: 1 % (ref 0–1)
Lymphocytes Relative: 39 % (ref 12–46)
MCHC: 33.7 g/dL (ref 30.0–36.0)
Neutro Abs: 2.4 10*3/uL (ref 1.7–7.7)
Neutrophils Relative %: 43 % (ref 43–77)
Platelets: 274 10*3/uL (ref 150–400)
RDW: 14.2 % (ref 11.5–15.5)
WBC: 5.6 10*3/uL (ref 4.0–10.5)

## 2012-09-26 MED ORDER — LEVOFLOXACIN 500 MG PO TABS: 500.0000 mg | ORAL_TABLET | Freq: Every day | ORAL | Status: DC

## 2012-09-26 MED ORDER — LEVOFLOXACIN 500 MG PO TABS
500.0000 mg | ORAL_TABLET | Freq: Once | ORAL | Status: AC
  Administered 2012-09-26: 500 mg via ORAL
  Filled 2012-09-26: qty 1

## 2012-09-26 NOTE — ED Provider Notes (Signed)
History     CSN: [id number]  Arrival date & time 09/26/12  2012   First MD Initiated Contact with Patient 09/26/12 2159      Chief Complaint  Patient presents with  . Shortness of Breath     The history is provided by the patient.   05 caveat: Dementia.  Patient states the patient has had some cough and may be mild changes in his baseline dementia over the past several days.  No shortness of breath.  The patient has no complaints at this time.  The patient's son reports that they've been caring for his father at their home for many years and that honestly his wife "just needs a break".  Patient was in the skilled nursing facility for 90 days this summer but after the 90 days the patient was sent back to the son's house.  The son reports that they do not have the finances today for private nursing home at nearly $6000 a month.  No abdominal pain vomiting or diarrhea per the son.  No documented fevers at home.  Symptoms are mild in severity  Past Medical History  Diagnosis Date  . Thymoma     s/p resection  . Acute cholecystitis 11/2011    s/p perc drain 12/03/11  . Basal cell carcinoma     multiple areas  . Prostate cancer   . Stroke   . Arthritis   . Depression   . Dementia     Short term  . Head injury     Concussion- has had short term memory and it deterates .  Marland Kitchen Cholecystitis     S/P cholecytostomy tube  . Dementia   . Stroke   . Hyperparathyroidism     Past Surgical History  Procedure Laterality Date  . Prostatectomy    . Colon surgery  1994    Removal of parts of colon  . Thoracotomy  01/13/2012    Procedure: THORACOTOMY MAJOR;  Surgeon: Alleen Borne, MD;  Location: [facility] OR;  Service: Thoracic;  Laterality: Left;  LEFT THORACOTOMY, RESECTION OF THYMOMA  . Cholecystectomy  02/01/2012    Procedure: LAPAROSCOPIC CHOLECYSTECTOMY WITH INTRAOPERATIVE CHOLANGIOGRAM;  Surgeon: Ardeth Sportsman, MD;  Location: [facility] ORS;  Service: General;  Laterality: N/A;  4 port    Family  History  Problem Relation Age of Onset  . Cancer Mother   . Aneurysm Mother     History  Substance Use Topics  . Smoking status: Former Smoker    Types: Cigarettes    Quit date: 07/26/1968  . Smokeless tobacco: Never Used  . Alcohol Use: No      Review of Systems  Unable to perform ROS: Dementia  Respiratory: Positive for shortness of breath.     Allergies  Other  Home Medications   Current Outpatient Rx  Name  Route  Sig  Dispense  Refill  . levofloxacin (LEVAQUIN) 500 MG tablet   Oral   Take 1 tablet (500 mg total) by mouth daily.   7 tablet   0     BP 147/87  Pulse 66  Temp(Src) 98.1 F (36.7 C) (Oral)  Resp 16  SpO2 95%  Physical Exam  Nursing note and vitals reviewed. Constitutional: He is oriented to person, place, and time. He appears well-developed and well-nourished.  HENT:  Head: Normocephalic and atraumatic.  Eyes: EOM are normal.  Neck: Normal range of motion.  Cardiovascular: Normal rate, regular rhythm, normal heart sounds and intact distal pulses.  Pulmonary/Chest: Effort normal and breath sounds normal. No respiratory distress.  Abdominal: Soft. He exhibits no distension. There is no tenderness.  Musculoskeletal: Normal range of motion.  Neurological: He is alert and oriented to person, place, and time.  Skin: Skin is warm and dry.  Psychiatric: He has a normal mood and affect. Judgment normal.    ED Course  Procedures   Labs Reviewed  CBC WITH DIFFERENTIAL - Abnormal; Notable for the following:    RBC 4.11 (*)    Hemoglobin 12.7 (*)    HCT 37.7 (*)    Eosinophils Relative 8 (*)    All other components within normal limits  COMPREHENSIVE METABOLIC PANEL - Abnormal; Notable for the following:    Potassium 3.4 (*)    Glucose, Bld 114 (*)    Calcium 11.2 (*)    GFR calc non Af Amer 63 (*)    GFR calc Af Amer 73 (*)    All other components within normal limits   Dg Chest 2 View  09/26/2012  *RADIOLOGY REPORT*  Clinical Data:  Status post fall.  Shortness of breath.  Concern for chest injury.  CHEST - 2 VIEW  Comparison: Chest radiograph performed 02/21/2012  Findings: The lungs are well-aerated.  Bilateral airspace opacities are more prominent than on prior studies.  Though this could reflect worsened atelectasis, pneumonia cannot be excluded.  Would correlate for associated symptoms.  No pleural effusion or pneumothorax is identified.  The heart is borderline normal in size; associated postoperative change is seen.  No acute osseous abnormalities are seen.  Clips are noted within the right upper quadrant, reflecting prior cholecystectomy.  IMPRESSION: Bilateral airspace opacities are more prominent than on prior studies.  Though this could reflect worsened atelectasis, pneumonia is a concern.  Would correlate for associated symptoms.  No displaced rib fractures seen.   Original Report Authenticated By: Tonia Ghent, M.D.    I personally reviewed the imaging tests through PACS system I reviewed available ER/hospitalization records through the EMR   1. Cough       MDM  The patient is well-appearing.  Vital signs are normal.  This may just represent atelectasis.  Will cover with antibiotics.  The patient's son has stated that they really just needed a break as they've been caring for her the patient for many years.  I recommended a checkout respite care.         Lyanne Co, MD 09/26/12 [phone]

## 2012-09-26 NOTE — ED Notes (Signed)
Patient transported to X-ray 

## 2012-09-26 NOTE — ED Notes (Addendum)
Pt states that his son wanted him to come in. Pt has no pain, no feeling of difficulty breathing. Pt lungs sounds clear. Pt states he did have a cough early but is not coughing now. Pt also has bump on head from recent fall.

## 2014-09-23 ENCOUNTER — Encounter (HOSPITAL_COMMUNITY): Payer: Self-pay | Admitting: *Deleted

## 2014-09-23 ENCOUNTER — Inpatient Hospital Stay (HOSPITAL_COMMUNITY): Payer: Medicare Other

## 2014-09-23 ENCOUNTER — Emergency Department (HOSPITAL_COMMUNITY): Payer: Medicare Other

## 2014-09-23 ENCOUNTER — Inpatient Hospital Stay (HOSPITAL_COMMUNITY)
Admission: EM | Admit: 2014-09-23 | Discharge: 2014-10-03 | DRG: 389 | Disposition: A | Discharge status: Skilled Nursing Facility | Payer: Medicare Other | Attending: Internal Medicine | Admitting: Internal Medicine

## 2014-09-23 DIAGNOSIS — K56609 Unspecified intestinal obstruction, unspecified as to partial versus complete obstruction: Secondary | ICD-10-CM

## 2014-09-23 DIAGNOSIS — E860 Dehydration: Secondary | ICD-10-CM | POA: Diagnosis present

## 2014-09-23 DIAGNOSIS — E870 Hyperosmolality and hypernatremia: Secondary | ICD-10-CM | POA: Diagnosis present

## 2014-09-23 DIAGNOSIS — Z9049 Acquired absence of other specified parts of digestive tract: Secondary | ICD-10-CM | POA: Diagnosis present

## 2014-09-23 DIAGNOSIS — Z809 Family history of malignant neoplasm, unspecified: Secondary | ICD-10-CM

## 2014-09-23 DIAGNOSIS — Z9079 Acquired absence of other genital organ(s): Secondary | ICD-10-CM | POA: Diagnosis present

## 2014-09-23 DIAGNOSIS — R1013 Epigastric pain: Secondary | ICD-10-CM | POA: Diagnosis not present

## 2014-09-23 DIAGNOSIS — K567 Ileus, unspecified: Secondary | ICD-10-CM | POA: Diagnosis present

## 2014-09-23 DIAGNOSIS — Z6825 Body mass index (BMI) 25.0-25.9, adult: Secondary | ICD-10-CM | POA: Diagnosis not present

## 2014-09-23 DIAGNOSIS — Z8546 Personal history of malignant neoplasm of prostate: Secondary | ICD-10-CM

## 2014-09-23 DIAGNOSIS — Z8673 Personal history of transient ischemic attack (TIA), and cerebral infarction without residual deficits: Secondary | ICD-10-CM

## 2014-09-23 DIAGNOSIS — F0390 Unspecified dementia without behavioral disturbance: Secondary | ICD-10-CM | POA: Diagnosis not present

## 2014-09-23 DIAGNOSIS — Z8719 Personal history of other diseases of the digestive system: Secondary | ICD-10-CM

## 2014-09-23 DIAGNOSIS — Z79899 Other long term (current) drug therapy: Secondary | ICD-10-CM | POA: Diagnosis not present

## 2014-09-23 DIAGNOSIS — Z85828 Personal history of other malignant neoplasm of skin: Secondary | ICD-10-CM

## 2014-09-23 DIAGNOSIS — K5669 Other intestinal obstruction: Secondary | ICD-10-CM | POA: Diagnosis not present

## 2014-09-23 DIAGNOSIS — Z87891 Personal history of nicotine dependence: Secondary | ICD-10-CM | POA: Diagnosis not present

## 2014-09-23 DIAGNOSIS — R32 Unspecified urinary incontinence: Secondary | ICD-10-CM | POA: Diagnosis present

## 2014-09-23 DIAGNOSIS — K56600 Partial intestinal obstruction, unspecified as to cause: Secondary | ICD-10-CM

## 2014-09-23 DIAGNOSIS — G309 Alzheimer's disease, unspecified: Secondary | ICD-10-CM | POA: Diagnosis present

## 2014-09-23 DIAGNOSIS — R1084 Generalized abdominal pain: Secondary | ICD-10-CM

## 2014-09-23 DIAGNOSIS — E46 Unspecified protein-calorie malnutrition: Secondary | ICD-10-CM | POA: Diagnosis present

## 2014-09-23 DIAGNOSIS — E876 Hypokalemia: Secondary | ICD-10-CM | POA: Diagnosis present

## 2014-09-23 DIAGNOSIS — F0280 Dementia in other diseases classified elsewhere without behavioral disturbance: Secondary | ICD-10-CM | POA: Diagnosis present

## 2014-09-23 DIAGNOSIS — Z4659 Encounter for fitting and adjustment of other gastrointestinal appliance and device: Secondary | ICD-10-CM

## 2014-09-23 LAB — CBC WITH DIFFERENTIAL/PLATELET
BASOS PCT: 0 % (ref 0–1)
Basophils Absolute: 0 10*3/uL (ref 0.0–0.1)
Eosinophils Absolute: 0 10*3/uL (ref 0.0–0.7)
Eosinophils Relative: 0 % (ref 0–5)
HCT: 43.3 % (ref 39.0–52.0)
HEMOGLOBIN: 14.2 g/dL (ref 13.0–17.0)
LYMPHS ABS: 1 10*3/uL (ref 0.7–4.0)
Lymphocytes Relative: 8 % — ABNORMAL LOW (ref 12–46)
MCH: 31.6 pg (ref 26.0–34.0)
MCHC: 32.8 g/dL (ref 30.0–36.0)
MCV: 96.4 fL (ref 78.0–100.0)
MONOS PCT: 9 % (ref 3–12)
Monocytes Absolute: 1.2 10*3/uL — ABNORMAL HIGH (ref 0.1–1.0)
NEUTROS ABS: 10.9 10*3/uL — AB (ref 1.7–7.7)
NEUTROS PCT: 83 % — AB (ref 43–77)
Platelets: 301 10*3/uL (ref 150–400)
RBC: 4.49 MIL/uL (ref 4.22–5.81)
RDW: 13.5 % (ref 11.5–15.5)
WBC: 13 10*3/uL — AB (ref 4.0–10.5)

## 2014-09-23 LAB — COMPREHENSIVE METABOLIC PANEL
ALT: 17 U/L (ref 0–53)
AST: 31 U/L (ref 0–37)
Albumin: 3.5 g/dL (ref 3.5–5.2)
Alkaline Phosphatase: 52 U/L (ref 39–117)
Anion gap: 48 — ABNORMAL HIGH (ref 5–15)
BUN: 40 mg/dL — ABNORMAL HIGH (ref 6–23)
CO2: 23 mmol/L (ref 19–32)
Calcium: 7.7 mg/dL — ABNORMAL LOW (ref 8.4–10.5)
Chloride: 83 mmol/L — ABNORMAL LOW (ref 96–112)
Creatinine, Ser: 1.18 mg/dL (ref 0.50–1.35)
GFR calc Af Amer: 63 mL/min — ABNORMAL LOW (ref >90)
GFR, EST NON AFRICAN AMERICAN: 54 mL/min — AB (ref >90)
GLUCOSE: 114 mg/dL — AB (ref 70–99)
POTASSIUM: 3.7 mmol/L (ref 3.5–5.1)
SODIUM: 154 mmol/L — AB (ref 135–145)
Total Bilirubin: 1.1 mg/dL (ref 0.3–1.2)
Total Protein: 6.1 g/dL (ref 6.0–8.3)

## 2014-09-23 LAB — URINALYSIS, ROUTINE W REFLEX MICROSCOPIC
Bilirubin Urine: NEGATIVE
Glucose, UA: NEGATIVE mg/dL
HGB URINE DIPSTICK: NEGATIVE
Ketones, ur: NEGATIVE mg/dL
LEUKOCYTES UA: NEGATIVE
NITRITE: NEGATIVE
Protein, ur: NEGATIVE mg/dL
SPECIFIC GRAVITY, URINE: 1.026 (ref 1.005–1.030)
UROBILINOGEN UA: 1 mg/dL (ref 0.0–1.0)
pH: 5.5 (ref 5.0–8.0)

## 2014-09-23 LAB — TROPONIN I

## 2014-09-23 LAB — LIPASE, BLOOD: LIPASE: 18 U/L (ref 11–59)

## 2014-09-23 LAB — I-STAT CG4 LACTIC ACID, ED: LACTIC ACID, VENOUS: 1.18 mmol/L (ref 0.5–2.0)

## 2014-09-23 MED ORDER — ENOXAPARIN SODIUM 40 MG/0.4ML ~~LOC~~ SOLN
40.0000 mg | Freq: Every day | SUBCUTANEOUS | Status: DC
  Administered 2014-09-23: 40 mg via SUBCUTANEOUS
  Filled 2014-09-23 (×2): qty 0.4

## 2014-09-23 MED ORDER — ACETAMINOPHEN 650 MG RE SUPP: 650.0000 mg | Freq: Four times a day (QID) | RECTAL | Status: DC | PRN

## 2014-09-23 MED ORDER — MORPHINE SULFATE 2 MG/ML IJ SOLN
1.0000 mg | INTRAMUSCULAR | Status: DC | PRN
  Administered 2014-09-24 – 2014-09-29 (×15): 1 mg via INTRAVENOUS
  Filled 2014-09-23 (×15): qty 1

## 2014-09-23 MED ORDER — IOHEXOL 300 MG/ML  SOLN
100.0000 mL | Freq: Once | INTRAMUSCULAR | Status: AC | PRN
  Administered 2014-09-23: 100 mL via INTRAVENOUS

## 2014-09-23 MED ORDER — ONDANSETRON HCL 4 MG/2ML IJ SOLN
4.0000 mg | Freq: Four times a day (QID) | INTRAMUSCULAR | Status: DC | PRN
Start: 2014-09-23 — End: 2014-10-03
  Administered 2014-09-24 – 2014-09-30 (×3): 4 mg via INTRAVENOUS
  Filled 2014-09-23 (×3): qty 2

## 2014-09-23 MED ORDER — SODIUM CHLORIDE 0.9 % IV SOLN
Freq: Once | INTRAVENOUS | Status: AC
  Administered 2014-09-23: 17:00:00 via INTRAVENOUS

## 2014-09-23 MED ORDER — ACETAMINOPHEN 325 MG PO TABS: 650.0000 mg | ORAL_TABLET | Freq: Four times a day (QID) | ORAL | Status: DC | PRN

## 2014-09-23 MED ORDER — SODIUM CHLORIDE 0.9 % IV SOLN
INTRAVENOUS | Status: DC
  Administered 2014-09-23: 75 mL/h via INTRAVENOUS

## 2014-09-23 MED ORDER — ONDANSETRON HCL 4 MG PO TABS: 4.0000 mg | ORAL_TABLET | Freq: Four times a day (QID) | ORAL | Status: DC | PRN

## 2014-09-23 MED ORDER — SODIUM CHLORIDE 0.9 % IV SOLN
INTRAVENOUS | Status: DC
  Administered 2014-09-23: via INTRAVENOUS

## 2014-09-23 MED ORDER — IOHEXOL 300 MG/ML  SOLN
25.0000 mL | Freq: Once | INTRAMUSCULAR | Status: AC | PRN
  Administered 2014-09-23: 25 mL via ORAL

## 2014-09-23 NOTE — ED Notes (Signed)
I&O was unsuccessful, could not thread past prostate. Bladder scanned patient to make sure that patient was not retaining. Talked to Dr Betsey Holiday about situation. Dr stated to wait and possibly retry urinal before coude cath.

## 2014-09-23 NOTE — H&P (Signed)
[facility] History and Physical  Jose Crawford KGY:[id number] DOB: September 28, 1925 DOA: 09/23/2014  Referring physician: ER physician. PCP: No primary care provider on file.   Chief Complaint: Abdominal pain with nausea vomiting.  History obtained from ER physician and patient's son-in-law.  HPI: Jose Crawford is a 81 y.o. male with history of dementia, thymoma status post resection, prostate cancer in remission was brought to the ER after patient was having nausea vomiting with abdominal pain. Patient was initially having nausea vomiting and diarrhea 3 days ago and last 2 days patient has been persistent nausea and vomiting. Patient also has been having abdominal pain. In the ER patient had CT abdomen and pelvis which shows small bowel obstruction with transition point. At this time patient has been admitted for further management of small bowel obstruction. NG tube has been placed for suction. On-call surgeon has been consulted. Patient denies any chest pain or shortness of breath.   Review of Systems: As presented in the history of presenting illness, rest negative.  Past Medical History  Diagnosis Date  . Thymoma     s/p resection  . Acute cholecystitis 11/2011    s/p perc drain 12/03/11  . Basal cell carcinoma     multiple areas  . Prostate cancer   . Stroke   . Arthritis   . Depression   . Dementia     Short term  . Head injury     Concussion- has had short term memory and it deterates .  Marland Kitchen Cholecystitis     S/P cholecytostomy tube  . Dementia   . Stroke   . Hyperparathyroidism    Past Surgical History  Procedure Laterality Date  . Prostatectomy    . Colon surgery  1994    Removal of parts of colon  . Thoracotomy  01/13/2012    Procedure: THORACOTOMY MAJOR;  Surgeon: Gaye Pollack, MD;  Location: [facility] OR;  Service: Thoracic;  Laterality: Left;  LEFT THORACOTOMY, RESECTION OF THYMOMA  . Cholecystectomy  02/01/2012    Procedure: LAPAROSCOPIC CHOLECYSTECTOMY WITH  INTRAOPERATIVE CHOLANGIOGRAM;  Surgeon: Adin Hector, MD;  Location: [facility] ORS;  Service: General;  Laterality: N/A;  4 port   Social History:  reports that he quit smoking about 46 years ago. His smoking use included Cigarettes. He has never used smokeless tobacco. He reports that he does not drink alcohol or use illicit drugs. Where does patient live home. Can patient participate in ADLs? Not sure.  Allergies  Allergen Reactions  . Other Other (See Comments)    All narcotics. Pt. Is hard to manage    Family History:  Family History  Problem Relation Age of Onset  . Cancer Mother   . Aneurysm Mother       Prior to Admission medications   Medication Sig Start Date End Date Taking? Authorizing Provider  acetaminophen (TYLENOL) 500 MG tablet Take 1,000 mg by mouth at bedtime.   Yes Historical Provider, MD  Ascorbic Acid (VITAMIN C PO) Take 2 capsules by mouth daily.   Yes Historical Provider, MD  Melatonin 5 MG CAPS Take 15 capsules by mouth at bedtime.   Yes Historical Provider, MD  Multiple Vitamins-Minerals (MULTIVITAMIN WITH MINERALS) tablet Take 1 tablet by mouth daily.   Yes Historical Provider, MD  Omega-3 Fatty Acids (FISH OIL PO) Take 2 capsules by mouth every evening.   Yes Historical Provider, MD  [facility] Wort 300 MG TABS Take 1 tablet by mouth at bedtime.  Yes Historical Provider, MD  TURMERIC PO Take 1,000 mg by mouth daily.   Yes Historical Provider, MD  levofloxacin (LEVAQUIN) 500 MG tablet Take 1 tablet (500 mg total) by mouth daily. Patient not taking: Reported on 09/23/2014 09/26/12   Hoy Morn, MD    Physical Exam: Filed Vitals:   09/23/14 1549 09/23/14 1900 09/23/14 2000 09/23/14 2100  BP: 115/69 132/85 132/76 139/78  Pulse: 95 95 91 91  Temp: 98.1 F (36.7 C)     TempSrc: Oral     Resp: 17 13 15 15   SpO2: 93% 94% 90% 92%     General:  Well-developed and nourished.  Eyes: Anicteric no pallor.  ENT: No discharge from the ears eyes nose and  mouth.  Neck: No mass felt.  Cardiovascular: S1-S2 heard.  Respiratory: No rhonchi or crepitations.  Abdomen: Soft mildly distended bowel sounds are appreciated no guarding or rigidity.  Skin: No rash.  Musculoskeletal: No edema.  Psychiatric: Patient is alert and awake.  Neurologic: Alert awake and oriented to his name. Moves all extremities.  Labs on Admission:  Basic Metabolic Panel:  Recent Labs Lab 09/23/14 1639  NA 154*  K 3.7  CL 83*  CO2 23  GLUCOSE 114*  BUN 40*  CREATININE 1.18  CALCIUM 7.7*   Liver Function Tests:  Recent Labs Lab 09/23/14 1639  AST 31  ALT 17  ALKPHOS 52  BILITOT 1.1  PROT 6.1  ALBUMIN 3.5    Recent Labs Lab 09/23/14 1639  LIPASE 18   No results for input(s): AMMONIA in the last 168 hours. CBC:  Recent Labs Lab 09/23/14 1639  WBC 13.0*  NEUTROABS 10.9*  HGB 14.2  HCT 43.3  MCV 96.4  PLT 301   Cardiac Enzymes:  Recent Labs Lab 09/23/14 1639  TROPONINI <0.03    BNP (last 3 results) No results for input(s): BNP in the last 8760 hours.  ProBNP (last 3 results) No results for input(s): PROBNP in the last 8760 hours.  CBG: No results for input(s): GLUCAP in the last 168 hours.  Radiological Exams on Admission: Ct Abdomen Pelvis W Contrast  09/23/2014   CLINICAL DATA:  Nausea, vomiting, diarrhea for 3 days  EXAM: CT ABDOMEN AND PELVIS WITH CONTRAST  TECHNIQUE: Multidetector CT imaging of the abdomen and pelvis was performed using the standard protocol following bolus administration of intravenous contrast.  CONTRAST:  26mL OMNIPAQUE IOHEXOL 300 MG/ML SOLN, 191mL OMNIPAQUE IOHEXOL 300 MG/ML SOLN  COMPARISON:  Abdominal radiographs same date, CT abdomen/ pelvis 07/29/2012  FINDINGS: Lower chest: Bilateral lower lobe curvilinear atelectasis or scarring noted. Trace left pleural fluid is present.  Hepatobiliary: Sub cm too small to characterize hepatic hypodense lesions are reidentified. Cholecystectomy clips are  noted.  Pancreas: Pancreatic calcifications are identified, some new since the prior exam. This may signify interval pancreatitis or progression of adjacent vascular calcification with volume averaging. No pancreatic ductal dilatation or peripancreatic collection is identified.  Spleen: Normal  Adrenals/Urinary Tract: Mild bilateral adrenal fullness is reidentified without measurable mass. The right adrenal gland is imaged at an angle, simulating a mass. Nonobstructing 7 mm dominant right mid renal calculus identified. Bilateral renal cortical cysts and too small to characterize hypodense lesions are identified, largest 1.9 cm at the mid left kidney image 42. No hydroureteronephrosis. Retro aortic left renal vein.  Stomach/Bowel: Diffuse small bowel dilatation is identified to the level of the distal terminal ileum, with apparent focal transition point image 67 series 2, image 60 series  4, and image 49 series 5. Maximal small bowel diameter measures 4.2 cm image 54. The appendix is not identified. A small amount of free fluid is noted in the pelvis. Colon is decompressed and unremarkable with the exception of a few colonic diverticuli without evidence for diverticulitis.  Vascular/Lymphatic: Severe atheromatous aortic calcification without aneurysm. No lymphadenopathy.  Reproductive: Penile implant partly visualized. Prostate presumed surgically absent with probable TURP defect.  Musculoskeletal: Multilevel disc degenerative changes noted in the spine. No lytic or sclerotic osseous lesion is identified.  Other: No free air.  IMPRESSION: Distal small bowel obstruction with focal transition point at the level of the distal terminal ileum but no mass identified. This could indicate adhesion or stricture. A mass could be obscured.  Pelvic free fluid noted without free air identified.   Electronically Signed   By: Conchita Paris M.D.   On: 09/23/2014 20:11   Dg Abd Acute W/chest  09/23/2014   CLINICAL DATA:  Back  pain  EXAM: ACUTE ABDOMEN SERIES (ABDOMEN 2 VIEW & CHEST 1 VIEW)  COMPARISON:  None.  FINDINGS: Normal heart size. Calcified atherosclerotic plaque involves the thoracic aorta. Left midlung scarring noted. There is asymmetric elevation of the right hemidiaphragm. Dilated loops of small bowel are identified measuring up to 5.6 cm. Dynamic air-fluid levels are identified on the left lateral decubitus radiograph no free intraperitoneal air are identified.  IMPRESSION: 1. Small bowel obstruction pattern identified. No free intraperitoneal air are identified.   Electronically Signed   By: Kerby Moors M.D.   On: 09/23/2014 16:51   Dg Abd Portable 1v  09/23/2014   CLINICAL DATA:  Nasogastric tube Check  EXAM: PORTABLE ABDOMEN - 1 VIEW  COMPARISON:  Abdominal CT from the same day  FINDINGS: Nasogastric tube tip is at the level of the distal esophagus, and for the side port to reach the stomach advancement by 13 cm required.  Known small bowel obstruction. There is normal appearing urinary contrast excretion. Incidentally low bladder in the setting of prostatectomy.  These results were called by telephone at the time of interpretation on 09/23/2014 at 10:26 pm to [facility], who verbally acknowledged these results.  IMPRESSION: Nasogastric tube tip at the distal esophagus. Advancement by 13 cm required for the side port to reach the stomach.   Electronically Signed   By: Monte Fantasia M.D.   On: 09/23/2014 22:28     Assessment/Plan Principal Problem:   SBO (small bowel obstruction) Active Problems:   Dementia   Hypocalcemia   1. Small bowel obstruction - appreciate surgery consult and recommendations. Patient has been placed on NG tube suction. Patient will be kept nothing by mouth. Repeat KUB in a.m. Gently hydrate. Pain relief medications. 2. Hypernatremia - probably from dehydration. Hydrate with D5W and closely follow metabolic panel. 3. Hypocalcemia - check ionized calcium and closely follow  metabolic panel. Check EKG for any changes. 4. Dementia - no acute issues. 5. History of thymoma status post resection. 6. History of prostate cancer.   DVT Prophylaxis SCDs. Code Status: Full code. Please verify with patient's daughter, in a.m. During previous admission it was DO NOT RESUSCITATE. Family Communication: Patient's son-in-law.  Disposition Plan: Admit to inpatient.    Cleora Karnik N. [facility] Pager [phone].  If 7PM-7AM, please contact night-coverage [email or web address] Password [facility] 09/23/2014, 10:33 PM

## 2014-09-23 NOTE — ED Notes (Signed)
Dr. Paula Compton at bedside.

## 2014-09-23 NOTE — ED Notes (Signed)
Spoken to Dr. Zella Richer and reported that NG was not in abd and to insert further. After inserting further able to auscultate gurgling sounds and note output of dk brownish secretions. Pt tolerated well.

## 2014-09-23 NOTE — ED Provider Notes (Addendum)
CSN: [id number]     Arrival date & time 09/23/14  1533 History   First MD Initiated Contact with Patient 09/23/14 1612     Chief Complaint  Patient presents with  . Emesis  . Diarrhea  . Nausea     (Consider location/radiation/quality/duration/timing/severity/associated sxs/prior Treatment) HPI Comments: Patient presents to the ER for evaluation of nausea, vomiting, diarrhea with abdominal pain. Symptoms reportedly ongoing for the last 3 days. Patient reports the pain is in the center of his abdomen and goes into his back. Pain has been constant in nature.  Patient is a 81 y.o. male presenting with vomiting and diarrhea.  Emesis Associated symptoms: abdominal pain and diarrhea   Diarrhea Associated symptoms: abdominal pain and vomiting     Past Medical History  Diagnosis Date  . Thymoma     s/p resection  . Acute cholecystitis 11/2011    s/p perc drain 12/03/11  . Basal cell carcinoma     multiple areas  . Prostate cancer   . Stroke   . Arthritis   . Depression   . Dementia     Short term  . Head injury     Concussion- has had short term memory and it deterates .  Marland Kitchen Cholecystitis     S/P cholecytostomy tube  . Dementia   . Stroke   . Hyperparathyroidism    Past Surgical History  Procedure Laterality Date  . Prostatectomy    . Colon surgery  1994    Removal of parts of colon  . Thoracotomy  01/13/2012    Procedure: THORACOTOMY MAJOR;  Surgeon: Gaye Pollack, MD;  Location: [facility] OR;  Service: Thoracic;  Laterality: Left;  LEFT THORACOTOMY, RESECTION OF THYMOMA  . Cholecystectomy  02/01/2012    Procedure: LAPAROSCOPIC CHOLECYSTECTOMY WITH INTRAOPERATIVE CHOLANGIOGRAM;  Surgeon: Adin Hector, MD;  Location: [facility] OR[facility];  Service: General;  Laterality: N/A;  4 port   Family History  Problem Relation Age of Onset  . Cancer Mother   . Aneurysm Mother    History  Substance Use Topics  . Smoking status: Former Smoker    Types: Cigarettes    Quit date: 07/26/1968  .  Smokeless tobacco: Never Used  . Alcohol Use: No    Review of Systems  Gastrointestinal: Positive for nausea, vomiting, abdominal pain and diarrhea.  All other systems reviewed and are negative.     Allergies  Other  Home Medications   Prior to Admission medications   Medication Sig Start Date End Date Taking? Authorizing Provider  acetaminophen (TYLENOL) 500 MG tablet Take 1,000 mg by mouth at bedtime.   Yes Historical Provider, MD  Ascorbic Acid (VITAMIN C PO) Take 2 capsules by mouth daily.   Yes Historical Provider, MD  Melatonin 5 MG CAPS Take 15 capsules by mouth at bedtime.   Yes Historical Provider, MD  Multiple Vitamins-Minerals (MULTIVITAMIN WITH MINERALS) tablet Take 1 tablet by mouth daily.   Yes Historical Provider, MD  Omega-3 Fatty Acids (FISH OIL PO) Take 2 capsules by mouth every evening.   Yes Historical Provider, MD  [facility] Wort 300 MG TABS Take 1 tablet by mouth at bedtime.   Yes Historical Provider, MD  TURMERIC PO Take 1,000 mg by mouth daily.   Yes Historical Provider, MD  levofloxacin (LEVAQUIN) 500 MG tablet Take 1 tablet (500 mg total) by mouth daily. Patient not taking: Reported on 09/23/2014 09/26/12   Hoy Morn, MD   BP 132/76 mmHg  Pulse 91  Temp(Src) 98.1 F (36.7 C) (Oral)  Resp 15  SpO2 90% Physical Exam  Constitutional: He is oriented to person, place, and time. He appears well-developed and well-nourished. No distress.  HENT:  Head: Normocephalic and atraumatic.  Right Ear: Hearing normal.  Left Ear: Hearing normal.  Nose: Nose normal.  Mouth/Throat: Oropharynx is clear and moist and mucous membranes are normal.  Eyes: Conjunctivae and EOM are normal. Pupils are equal, round, and reactive to light.  Neck: Normal range of motion. Neck supple.  Cardiovascular: Regular rhythm, S1 normal and S2 normal.  Exam reveals no gallop and no friction rub.   No murmur heard. Pulmonary/Chest: Effort normal and breath sounds normal. No respiratory  distress. He exhibits no tenderness.  Abdominal: He exhibits distension. Bowel sounds are decreased. There is no hepatosplenomegaly. There is generalized tenderness. There is no rebound, no guarding, no tenderness at McBurney's point and negative Murphy's sign. No hernia.  Musculoskeletal: Normal range of motion.  Neurological: He is alert and oriented to person, place, and time. He has normal strength. No cranial nerve deficit or sensory deficit. Coordination normal. GCS eye subscore is 4. GCS verbal subscore is 5. GCS motor subscore is 6.  Skin: Skin is warm, dry and intact. No rash noted. No cyanosis.  Psychiatric: He has a normal mood and affect. His speech is normal and behavior is normal. Thought content normal.  Nursing note and vitals reviewed.   ED Course  Procedures (including critical care time) Labs Review Labs Reviewed  CBC WITH DIFFERENTIAL/PLATELET - Abnormal; Notable for the following:    WBC 13.0 (*)    Neutrophils Relative % 83 (*)    Neutro Abs 10.9 (*)    Lymphocytes Relative 8 (*)    Monocytes Absolute 1.2 (*)    All other components within normal limits  COMPREHENSIVE METABOLIC PANEL - Abnormal; Notable for the following:    Sodium 154 (*)    Chloride 83 (*)    Glucose, Bld 114 (*)    BUN 40 (*)    Calcium 7.7 (*)    GFR calc non Af Amer 54 (*)    GFR calc Af Amer 63 (*)    Anion gap 48 (*)    All other components within normal limits  URINALYSIS, ROUTINE W REFLEX MICROSCOPIC - Abnormal; Notable for the following:    APPearance CLOUDY (*)    All other components within normal limits  LIPASE, BLOOD  TROPONIN I  I-STAT CG4 LACTIC ACID, ED    Imaging Review Ct Abdomen Pelvis W Contrast  09/23/2014   CLINICAL DATA:  Nausea, vomiting, diarrhea for 3 days  EXAM: CT ABDOMEN AND PELVIS WITH CONTRAST  TECHNIQUE: Multidetector CT imaging of the abdomen and pelvis was performed using the standard protocol following bolus administration of intravenous contrast.   CONTRAST:  45mL OMNIPAQUE IOHEXOL 300 MG/ML SOLN, 125mL OMNIPAQUE IOHEXOL 300 MG/ML SOLN  COMPARISON:  Abdominal radiographs same date, CT abdomen/ pelvis 07/29/2012  FINDINGS: Lower chest: Bilateral lower lobe curvilinear atelectasis or scarring noted. Trace left pleural fluid is present.  Hepatobiliary: Sub cm too small to characterize hepatic hypodense lesions are reidentified. Cholecystectomy clips are noted.  Pancreas: Pancreatic calcifications are identified, some new since the prior exam. This may signify interval pancreatitis or progression of adjacent vascular calcification with volume averaging. No pancreatic ductal dilatation or peripancreatic collection is identified.  Spleen: Normal  Adrenals/Urinary Tract: Mild bilateral adrenal fullness is reidentified without measurable mass. The right adrenal gland is imaged at an  angle, simulating a mass. Nonobstructing 7 mm dominant right mid renal calculus identified. Bilateral renal cortical cysts and too small to characterize hypodense lesions are identified, largest 1.9 cm at the mid left kidney image 42. No hydroureteronephrosis. Retro aortic left renal vein.  Stomach/Bowel: Diffuse small bowel dilatation is identified to the level of the distal terminal ileum, with apparent focal transition point image 67 series 2, image 60 series 4, and image 49 series 5. Maximal small bowel diameter measures 4.2 cm image 54. The appendix is not identified. A small amount of free fluid is noted in the pelvis. Colon is decompressed and unremarkable with the exception of a few colonic diverticuli without evidence for diverticulitis.  Vascular/Lymphatic: Severe atheromatous aortic calcification without aneurysm. No lymphadenopathy.  Reproductive: Penile implant partly visualized. Prostate presumed surgically absent with probable TURP defect.  Musculoskeletal: Multilevel disc degenerative changes noted in the spine. No lytic or sclerotic osseous lesion is identified.  Other: No  free air.  IMPRESSION: Distal small bowel obstruction with focal transition point at the level of the distal terminal ileum but no mass identified. This could indicate adhesion or stricture. A mass could be obscured.  Pelvic free fluid noted without free air identified.   Electronically Signed   By: Conchita Paris M.D.   On: 09/23/2014 20:11   Dg Abd Acute W/chest  09/23/2014   CLINICAL DATA:  Back pain  EXAM: ACUTE ABDOMEN SERIES (ABDOMEN 2 VIEW & CHEST 1 VIEW)  COMPARISON:  None.  FINDINGS: Normal heart size. Calcified atherosclerotic plaque involves the thoracic aorta. Left midlung scarring noted. There is asymmetric elevation of the right hemidiaphragm. Dilated loops of small bowel are identified measuring up to 5.6 cm. Dynamic air-fluid levels are identified on the left lateral decubitus radiograph no free intraperitoneal air are identified.  IMPRESSION: 1. Small bowel obstruction pattern identified. No free intraperitoneal air are identified.   Electronically Signed   By: Kerby Moors M.D.   On: 09/23/2014 16:51     EKG Interpretation None      MDM   Final diagnoses:  Abdominal pain, generalized  SBO (small bowel obstruction)    Patient presents to the ER for evaluation of abdominal pain, back pain. Patient reports nausea and vomiting and some diarrhea over the past 3 days. Patient did have abdominal distention with decreased bowel sounds. Acute abdominal x-ray was suspicious for small bowel obstruction. CAT scan confirms small bowel obstruction with a transition point, no obvious mass noted. Patient will require hospitalization for further management.  Dr. Zella Richer, general surgery, was consulted and will follow the patient.  Orpah Greek, MD 09/23/14 2038  Orpah Greek, MD 09/23/14 2104

## 2014-09-23 NOTE — Progress Notes (Signed)
EDCM spoke to patient at bedside.  Patent reports he lives in [place] with his family.  Address listed is in [place] Patient cannot think of the name of his pcp, but his pcp is in [place] per patient.  Patient reports he has a walker and a cane at home but he doesn't use them.  Patient reports he currently does not have home health services but has had [facility] in the past.  No family at bedside.  No further EDCM needs at this time.

## 2014-09-23 NOTE — ED Notes (Signed)
Bed: WA01 Expected date:  Expected time:  Means of arrival:  Comments: ems 

## 2014-09-23 NOTE — Consult Note (Signed)
Reason for Consult:  Small bowel obstruction Referring Physician:  Dr. Sharen Counter Jose Crawford is an 81 y.o. male.  HPI: he presented to the emergency department with a 3 day history of abdominal pain with distention, nausea, vomiting, and diarrhea. He said 3 intra-abdominal operations. Evaluation in the emergency department included abdominal x-rays which were consistent with small bowel obstruction. CT verified this. We subsequently have been asked to see him because of the diagnosis of small bowel obstruction. He's been having fairly frequent loose bowel movements.  Past Medical History  Diagnosis Date  . Thymoma     s/p resection  . Acute cholecystitis 11/2011    s/p perc drain 12/03/11  . Basal cell carcinoma     multiple areas  . Prostate cancer   . Stroke   . Arthritis   . Depression   . Dementia     Short term  . Head injury     Concussion- has had short term memory and it deterates .  Marland Kitchen Cholecystitis     S/P cholecytostomy tube  . Dementia   . Stroke   . Hyperparathyroidism     Past Surgical History  Procedure Laterality Date  . Prostatectomy    . Colon surgery  1994    Removal of parts of colon  . Thoracotomy  01/13/2012    Procedure: THORACOTOMY MAJOR;  Surgeon: Gaye Pollack, MD;  Location: [facility] OR;  Service: Thoracic;  Laterality: Left;  LEFT THORACOTOMY, RESECTION OF THYMOMA  . Cholecystectomy  02/01/2012    Procedure: LAPAROSCOPIC CHOLECYSTECTOMY WITH INTRAOPERATIVE CHOLANGIOGRAM;  Surgeon: Adin Hector, MD;  Location: [facility];  Service: General;  Laterality: N/A;  4 port    Family History  Problem Relation Age of Onset  . Cancer Mother   . Aneurysm Mother     Social History:  reports that he quit smoking about 46 years ago. His smoking use included Cigarettes. He has never used smokeless tobacco. He reports that he does not drink alcohol or use illicit drugs.  Allergies:  Allergies  Allergen Reactions  . Other Other (See Comments)    All narcotics. Pt.  Is hard to manage    Prior to Admission medications   Medication Sig Start Date End Date Taking? Authorizing Provider  acetaminophen (TYLENOL) 500 MG tablet Take 1,000 mg by mouth at bedtime.   Yes Historical Provider, MD  Ascorbic Acid (VITAMIN C PO) Take 2 capsules by mouth daily.   Yes Historical Provider, MD  Melatonin 5 MG CAPS Take 15 capsules by mouth at bedtime.   Yes Historical Provider, MD  Multiple Vitamins-Minerals (MULTIVITAMIN WITH MINERALS) tablet Take 1 tablet by mouth daily.   Yes Historical Provider, MD  Omega-3 Fatty Acids (FISH OIL PO) Take 2 capsules by mouth every evening.   Yes Historical Provider, MD  [facility] Wort 300 MG TABS Take 1 tablet by mouth at bedtime.   Yes Historical Provider, MD  TURMERIC PO Take 1,000 mg by mouth daily.   Yes Historical Provider, MD  levofloxacin (LEVAQUIN) 500 MG tablet Take 1 tablet (500 mg total) by mouth daily. Patient not taking: Reported on 09/23/2014 09/26/12   Hoy Morn, MD     Results for orders placed or performed during the hospital encounter of 09/23/14 (from the past 48 hour(s))  CBC with Differential/Platelet     Status: Abnormal   Collection Time: 09/23/14  4:39 PM  Result Value Ref Range   WBC 13.0 (H) 4.0 - 10.5 K/uL  RBC 4.49 4.22 - 5.81 MIL/uL   Hemoglobin 14.2 13.0 - 17.0 g/dL   HCT 43.3 39.0 - 52.0 %   MCV 96.4 78.0 - 100.0 fL   MCH 31.6 26.0 - 34.0 pg   MCHC 32.8 30.0 - 36.0 g/dL   RDW 13.5 11.5 - 15.5 %   Platelets 301 150 - 400 K/uL   Neutrophils Relative % 83 (H) 43 - 77 %   Neutro Abs 10.9 (H) 1.7 - 7.7 K/uL   Lymphocytes Relative 8 (L) 12 - 46 %   Lymphs Abs 1.0 0.7 - 4.0 K/uL   Monocytes Relative 9 3 - 12 %   Monocytes Absolute 1.2 (H) 0.1 - 1.0 K/uL   Eosinophils Relative 0 0 - 5 %   Eosinophils Absolute 0.0 0.0 - 0.7 K/uL   Basophils Relative 0 0 - 1 %   Basophils Absolute 0.0 0.0 - 0.1 K/uL  Comprehensive metabolic panel     Status: Abnormal   Collection Time: 09/23/14  4:39 PM  Result  Value Ref Range   Sodium 154 (H) 135 - 145 mmol/L    Comment: REPEATED TO VERIFY   Potassium 3.7 3.5 - 5.1 mmol/L    Comment: REPEATED TO VERIFY   Chloride 83 (L) 96 - 112 mmol/L    Comment: REPEATED TO VERIFY   CO2 23 19 - 32 mmol/L    Comment: REPEATED TO VERIFY   Glucose, Bld 114 (H) 70 - 99 mg/dL   BUN 40 (H) 6 - 23 mg/dL   Creatinine, Ser 1.18 0.50 - 1.35 mg/dL   Calcium 7.7 (L) 8.4 - 10.5 mg/dL    Comment: REPEATED TO VERIFY   Total Protein 6.1 6.0 - 8.3 g/dL   Albumin 3.5 3.5 - 5.2 g/dL   AST 31 0 - 37 U/L   ALT 17 0 - 53 U/L   Alkaline Phosphatase 52 39 - 117 U/L   Total Bilirubin 1.1 0.3 - 1.2 mg/dL   GFR calc non Af Amer 54 (L) >90 mL/min   GFR calc Af Amer 63 (L) >90 mL/min    Comment: (NOTE) The eGFR has been calculated using the CKD EPI equation. This calculation has not been validated in all clinical situations. eGFR's persistently <90 mL/min signify possible Chronic Kidney Disease.    Anion gap 48 (H) 5 - 15  Lipase, blood     Status: None   Collection Time: 09/23/14  4:39 PM  Result Value Ref Range   Lipase 18 11 - 59 U/L  Troponin I     Status: None   Collection Time: 09/23/14  4:39 PM  Result Value Ref Range   Troponin I <0.03 <0.031 ng/mL    Comment:        NO INDICATION OF MYOCARDIAL INJURY.   I-Stat CG4 Lactic Acid, ED     Status: None   Collection Time: 09/23/14  4:59 PM  Result Value Ref Range   Lactic Acid, Venous 1.18 0.5 - 2.0 mmol/L  Urinalysis, Routine w reflex microscopic     Status: Abnormal   Collection Time: 09/23/14  6:44 PM  Result Value Ref Range   Color, Urine YELLOW YELLOW   APPearance CLOUDY (A) CLEAR   Specific Gravity, Urine 1.026 1.005 - 1.030   pH 5.5 5.0 - 8.0   Glucose, UA NEGATIVE NEGATIVE mg/dL   Hgb urine dipstick NEGATIVE NEGATIVE   Bilirubin Urine NEGATIVE NEGATIVE   Ketones, ur NEGATIVE NEGATIVE mg/dL   Protein, ur NEGATIVE NEGATIVE  mg/dL   Urobilinogen, UA 1.0 0.0 - 1.0 mg/dL   Nitrite NEGATIVE NEGATIVE    Leukocytes, UA NEGATIVE NEGATIVE    Comment: MICROSCOPIC NOT DONE ON URINES WITH NEGATIVE PROTEIN, BLOOD, LEUKOCYTES, NITRITE, OR GLUCOSE <1000 mg/dL.    Ct Abdomen Pelvis W Contrast  09/23/2014   CLINICAL DATA:  Nausea, vomiting, diarrhea for 3 days  EXAM: CT ABDOMEN AND PELVIS WITH CONTRAST  TECHNIQUE: Multidetector CT imaging of the abdomen and pelvis was performed using the standard protocol following bolus administration of intravenous contrast.  CONTRAST:  86m OMNIPAQUE IOHEXOL 300 MG/ML SOLN, 1092mOMNIPAQUE IOHEXOL 300 MG/ML SOLN  COMPARISON:  Abdominal radiographs same date, CT abdomen/ pelvis 07/29/2012  FINDINGS: Lower chest: Bilateral lower lobe curvilinear atelectasis or scarring noted. Trace left pleural fluid is present.  Hepatobiliary: Sub cm too small to characterize hepatic hypodense lesions are reidentified. Cholecystectomy clips are noted.  Pancreas: Pancreatic calcifications are identified, some new since the prior exam. This may signify interval pancreatitis or progression of adjacent vascular calcification with volume averaging. No pancreatic ductal dilatation or peripancreatic collection is identified.  Spleen: Normal  Adrenals/Urinary Tract: Mild bilateral adrenal fullness is reidentified without measurable mass. The right adrenal gland is imaged at an angle, simulating a mass. Nonobstructing 7 mm dominant right mid renal calculus identified. Bilateral renal cortical cysts and too small to characterize hypodense lesions are identified, largest 1.9 cm at the mid left kidney image 42. No hydroureteronephrosis. Retro aortic left renal vein.  Stomach/Bowel: Diffuse small bowel dilatation is identified to the level of the distal terminal ileum, with apparent focal transition point image 67 series 2, image 60 series 4, and image 49 series 5. Maximal small bowel diameter measures 4.2 cm image 54. The appendix is not identified. A small amount of free fluid is noted in the pelvis. Colon is  decompressed and unremarkable with the exception of a few colonic diverticuli without evidence for diverticulitis.  Vascular/Lymphatic: Severe atheromatous aortic calcification without aneurysm. No lymphadenopathy.  Reproductive: Penile implant partly visualized. Prostate presumed surgically absent with probable TURP defect.  Musculoskeletal: Multilevel disc degenerative changes noted in the spine. No lytic or sclerotic osseous lesion is identified.  Other: No free air.  IMPRESSION: Distal small bowel obstruction with focal transition point at the level of the distal terminal ileum but no mass identified. This could indicate adhesion or stricture. A mass could be obscured.  Pelvic free fluid noted without free air identified.   Electronically Signed   By: GrConchita Paris.D.   On: 09/23/2014 20:11   Dg Abd Acute W/chest  09/23/2014   CLINICAL DATA:  Back pain  EXAM: ACUTE ABDOMEN SERIES (ABDOMEN 2 VIEW & CHEST 1 VIEW)  COMPARISON:  None.  FINDINGS: Normal heart size. Calcified atherosclerotic plaque involves the thoracic aorta. Left midlung scarring noted. There is asymmetric elevation of the right hemidiaphragm. Dilated loops of small bowel are identified measuring up to 5.6 cm. Dynamic air-fluid levels are identified on the left lateral decubitus radiograph no free intraperitoneal air are identified.  IMPRESSION: 1. Small bowel obstruction pattern identified. No free intraperitoneal air are identified.   Electronically Signed   By: TaKerby Moors.D.   On: 09/23/2014 16:51   ROS limited due to dementia Review of Systems  Gastrointestinal: Positive for nausea, vomiting, abdominal pain and diarrhea.  Neurological:       Short-term memory loss   Blood pressure 139/78, pulse 91, temperature 98.1 F (36.7 C), temperature source Oral, resp. rate 15, SpO2  92 %. Physical Exam  Constitutional:  Elderly male in no acute distress.  Eyes: No scleral icterus.  Cardiovascular: Normal rate and regular rhythm.    GI: He exhibits distension. There is no tenderness.  Slightly firm and distended. Hypoactive bowel sounds are noted. Lower midline scar is noted. Small upper abdominal scars are noted. No incisional hernias noted.  Genitourinary:  No inguinal bulges.  Neurological: He is alert.  Skin: Skin is warm and dry.    Assessment/Plan: Abdominal pain with distention along with nausea and vomiting plus diarrhea. X-rays and CT scan suggest small bowel obstruction.  Recommendation: Nasogastric tube decompression, hydration, repeat abdominal x-rays in the morning. Try to treat this nonoperatively.  Sundiata Ferrick J 09/23/2014, 9:49 PM

## 2014-09-23 NOTE — ED Notes (Signed)
Dr Rosenbower at bedside 

## 2014-09-23 NOTE — ED Notes (Signed)
Awake. Verbally responsive. Resp even and unlabored. No audible adventitious breath sounds noted. ABC's intact. Abd soft/nondistended but tender to palpate. BS (+) and active x4 quadrants. No N/V/D reported. IV saline lock patent and intact. 

## 2014-09-23 NOTE — ED Notes (Signed)
Awake. Verbally responsive. Resp even and unlabored. No audible adventitious breath sounds noted. ABC's intact. Abd soft/nondistended but tender to palpate. BS (+) and active x4 quadrants. Pt reported episodes of nausea at times.Marland Kitchen

## 2014-09-23 NOTE — ED Notes (Addendum)
Protable to X-ray to verified NG tube placement. Stopped LWS until received verification.

## 2014-09-23 NOTE — ED Notes (Signed)
Awake. Verbally responsive. Resp even and unlabored. No audible adventitious breath sounds noted. ABC's intact. Abd soft/nondistended but tender to palpate. BS (+) and active x4 quadrants. No N/V/D reported. Pt informed of NG placement and verbalized understanding.

## 2014-09-23 NOTE — ED Notes (Signed)
Per EMS Pt c/o N/V/D x 3 days. Pt has hx of Dementia, pt A&Ox 3 person place event. Pt also c/o abdominal/back pain, Zofran 4 mg given on site

## 2014-09-24 ENCOUNTER — Inpatient Hospital Stay (HOSPITAL_COMMUNITY): Payer: Medicare Other

## 2014-09-24 DIAGNOSIS — E870 Hyperosmolality and hypernatremia: Secondary | ICD-10-CM | POA: Diagnosis present

## 2014-09-24 LAB — CBC WITH DIFFERENTIAL/PLATELET
BASOS ABS: 0 10*3/uL (ref 0.0–0.1)
BASOS PCT: 0 % (ref 0–1)
Eosinophils Absolute: 0 10*3/uL (ref 0.0–0.7)
Eosinophils Relative: 0 % (ref 0–5)
HEMATOCRIT: 36.6 % — AB (ref 39.0–52.0)
Hemoglobin: 12 g/dL — ABNORMAL LOW (ref 13.0–17.0)
LYMPHS ABS: 0.8 10*3/uL (ref 0.7–4.0)
Lymphocytes Relative: 8 % — ABNORMAL LOW (ref 12–46)
MCH: 31.7 pg (ref 26.0–34.0)
MCHC: 32.8 g/dL (ref 30.0–36.0)
MCV: 96.8 fL (ref 78.0–100.0)
Monocytes Absolute: 1.1 10*3/uL — ABNORMAL HIGH (ref 0.1–1.0)
Monocytes Relative: 11 % (ref 3–12)
Neutro Abs: 8.3 10*3/uL — ABNORMAL HIGH (ref 1.7–7.7)
Neutrophils Relative %: 81 % — ABNORMAL HIGH (ref 43–77)
Platelets: 289 10*3/uL (ref 150–400)
RBC: 3.78 MIL/uL — ABNORMAL LOW (ref 4.22–5.81)
RDW: 13.9 % (ref 11.5–15.5)
WBC: 10.3 10*3/uL (ref 4.0–10.5)

## 2014-09-24 LAB — COMPREHENSIVE METABOLIC PANEL
ALK PHOS: 50 U/L (ref 39–117)
ALT: 18 U/L (ref 0–53)
AST: 20 U/L (ref 0–37)
Albumin: 3.2 g/dL — ABNORMAL LOW (ref 3.5–5.2)
Anion gap: 5 (ref 5–15)
BILIRUBIN TOTAL: 0.9 mg/dL (ref 0.3–1.2)
BUN: 41 mg/dL — ABNORMAL HIGH (ref 6–23)
CO2: 27 mmol/L (ref 19–32)
Calcium: 10.5 mg/dL (ref 8.4–10.5)
Chloride: 104 mmol/L (ref 96–112)
Creatinine, Ser: 1.14 mg/dL (ref 0.50–1.35)
GFR, EST AFRICAN AMERICAN: 65 mL/min — AB (ref >90)
GFR, EST NON AFRICAN AMERICAN: 56 mL/min — AB (ref >90)
GLUCOSE: 120 mg/dL — AB (ref 70–99)
POTASSIUM: 3.2 mmol/L — AB (ref 3.5–5.1)
Sodium: 136 mmol/L (ref 135–145)
Total Protein: 5.3 g/dL — ABNORMAL LOW (ref 6.0–8.3)

## 2014-09-24 LAB — BASIC METABOLIC PANEL
ANION GAP: 6 (ref 5–15)
BUN: 42 mg/dL — ABNORMAL HIGH (ref 6–23)
CALCIUM: 10.5 mg/dL (ref 8.4–10.5)
CO2: 26 mmol/L (ref 19–32)
CREATININE: 1.25 mg/dL (ref 0.50–1.35)
Chloride: 104 mmol/L (ref 96–112)
GFR calc non Af Amer: 50 mL/min — ABNORMAL LOW (ref >90)
GFR, EST AFRICAN AMERICAN: 58 mL/min — AB (ref >90)
Glucose, Bld: 128 mg/dL — ABNORMAL HIGH (ref 70–99)
Potassium: 3.4 mmol/L — ABNORMAL LOW (ref 3.5–5.1)
Sodium: 136 mmol/L (ref 135–145)

## 2014-09-24 LAB — CLOSTRIDIUM DIFFICILE BY PCR: Toxigenic C. Difficile by PCR: NEGATIVE

## 2014-09-24 LAB — GLUCOSE, CAPILLARY
GLUCOSE-CAPILLARY: 118 mg/dL — AB (ref 70–99)
Glucose-Capillary: 104 mg/dL — ABNORMAL HIGH (ref 70–99)
Glucose-Capillary: 106 mg/dL — ABNORMAL HIGH (ref 70–99)

## 2014-09-24 LAB — CALCIUM, IONIZED: Calcium, Ion: 1.65 mmol/L — ABNORMAL HIGH (ref 1.12–1.32)

## 2014-09-24 MED ORDER — CETYLPYRIDINIUM CHLORIDE 0.05 % MT LIQD
7.0000 mL | Freq: Two times a day (BID) | OROMUCOSAL | Status: DC
  Administered 2014-09-24 – 2014-10-02 (×15): 7 mL via OROMUCOSAL

## 2014-09-24 MED ORDER — CHLORHEXIDINE GLUCONATE 0.12 % MT SOLN
15.0000 mL | Freq: Two times a day (BID) | OROMUCOSAL | Status: DC
  Administered 2014-09-24 – 2014-09-30 (×13): 15 mL via OROMUCOSAL
  Filled 2014-09-24 (×20): qty 15

## 2014-09-24 MED ORDER — PHENOL 1.4 % MT LIQD
1.0000 | OROMUCOSAL | Status: DC | PRN
  Filled 2014-09-24: qty 177

## 2014-09-24 MED ORDER — DEXTROSE 5 % IV SOLN
INTRAVENOUS | Status: AC
  Administered 2014-09-24 – 2014-09-25 (×3): via INTRAVENOUS

## 2014-09-24 NOTE — Progress Notes (Signed)
Placed flexiseal in patient since he is having frrequent water loose bowel movements (c diff negative). Completed trial of having patient off low intermittent suction for NG tube but patient became very nauseas and started to dry heave so I placed him back on low intermittent suction for relief.

## 2014-09-24 NOTE — Progress Notes (Addendum)
Progress Note   Jose Crawford JKD:[id number] DOB: 04/11/1926 DOA: 09/23/2014 PCP: [place]   Brief Narrative:   Jose Crawford is an 81 y.o. male with a PMH of dementia, thymoma status post left thoracotomy, prostate cancer in remission status post prostatectomy, history of laparoscopic cholecystectomy who was admitted 09/23/14 with chief complaint of abdominal pain, nausea and vomiting. CT scan done on admission showed a small bowel obstruction with a transition point. Surgery is following.  Assessment/Plan:   Principal Problem:   SBO (small bowel obstruction) - Surgery following. Continue NPO.  Clamp NG (large BM this a.m.) - Continue IVF. - Continue pain control efforts. - Repeat KUB shows improvement.  Active Problems:   Dementia - Stable.    Hypocalcemia - Follow-up ionized calcium.    Hypernatremia - Likely from dehydration. - Continue hypotonic IV fluids, follow-up BMET ordered for 12:00.    DVT Prophylaxis - Continue SCDs.  Code Status: Full. Family Communication: No family at the bedside.   Luanna Salk [phone]), daughter called---message left. Disposition Plan: May need SNF, lives with daugther.  PT evaluation requested.   IV Access:    Peripheral IV   Procedures and diagnostic studies:   Ct Abdomen Pelvis W Contrast 09/23/2014: Distal small bowel obstruction with focal transition point at the level of the distal terminal ileum but no mass identified. This could indicate adhesion or stricture. A mass could be obscured.  Pelvic free fluid noted without free air identified.    Dg Abd Acute W/chest 09/23/2014: 1. Small bowel obstruction pattern identified. No free intraperitoneal air are identified.     Dg Abd Portable 1v 09/23/2014: Nasogastric tube tip at the distal esophagus. Advancement by 13 cm required for the side port to reach the stomach.     Medical Consultants:    Jackolyn Confer, MD,  Surgery  Anti-Infectives:    None.  Subjective:    Jose Crawford is confused.  He was incontinent of a large, loose stool.  He has safety mittens on.  He has had abdominal pain and nausea.    Objective:    Filed Vitals:   09/23/14 2100 09/23/14 2200 09/23/14 2337 09/24/14 0628  BP: 139/78 155/86 137/72 131/77  Pulse: 91 94 91 82  Temp:   98.7 F (37.1 C) 98.3 F (36.8 C)  TempSrc:   Oral Oral  Resp: 15 17 18 16   Height:   6' (1.829 m)   Weight:   80.5 kg (177 lb 7.5 oz) 80.5 kg (177 lb 7.5 oz)  SpO2: 92% 89% 93% 94%    Intake/Output Summary (Last 24 hours) at 09/24/14 1447 Last data filed at 09/24/14 1037  Gross per 24 hour  Intake     30 ml  Output   1050 ml  Net  -1020 ml    Exam: Gen:  Groaning out, safety mittens on Cardiovascular:  RRR, No M/R/G Respiratory:  Lungs CTAB Gastrointestinal:  Abdomen soft, NT/ND, + BS Extremities:  No C/E/C   Data Reviewed:    Labs: Basic Metabolic Panel:  Recent Labs Lab 09/23/14 1639 09/24/14 0829 09/24/14 1140  NA 154* 136 136  K 3.7 3.2* 3.4*  CL 83* 104 104  CO2 23 27 26   GLUCOSE 114* 120* 128*  BUN 40* 41* 42*  CREATININE 1.18 1.14 1.25  CALCIUM 7.7* 10.5 10.5   GFR Estimated Creatinine Clearance: 46.6 mL/min (by C-G formula based on Cr of 1.25). Liver Function Tests:  Recent  Labs Lab 09/23/14 1639 09/24/14 0829  AST 31 20  ALT 17 18  ALKPHOS 52 50  BILITOT 1.1 0.9  PROT 6.1 5.3*  ALBUMIN 3.5 3.2*    Recent Labs Lab 09/23/14 1639  LIPASE 18   CBC:  Recent Labs Lab 09/23/14 1639 09/24/14 0829  WBC 13.0* 10.3  NEUTROABS 10.9* 8.3*  HGB 14.2 12.0*  HCT 43.3 36.6*  MCV 96.4 96.8  PLT 301 289   Cardiac Enzymes:  Recent Labs Lab 09/23/14 1639  TROPONINI <0.03   CBG:  Recent Labs Lab 09/24/14 0041 09/24/14 0627  GLUCAP 106* 118*   Sepsis Labs:  Recent Labs Lab 09/23/14 1639 09/23/14 1659 09/24/14 0829  WBC 13.0*  --  10.3  LATICACIDVEN  --  1.18  --     Microbiology Recent Results (from the past 240 hour(s))  Clostridium Difficile by PCR     Status: None   Collection Time: 09/24/14 10:56 AM  Result Value Ref Range Status   C difficile by pcr NEGATIVE NEGATIVE Final     Medications:   . antiseptic oral rinse  7 mL Mouth Rinse q12n4p  . chlorhexidine  15 mL Mouth Rinse BID   Continuous Infusions: . dextrose 100 mL/hr at 09/24/14 1438    Time spent: 25 minutes.   LOS: 1 day   Nickoli Bagheri  [facility] Pager [phone]. If unable to reach me by pager, please call my cell phone at [phone].  *Please refer to [email or web address], password [email or web address] to get updated schedule on who will round on this patient, as hospitalists switch teams weekly. If 7PM-7AM, please contact night-coverage at [email or web address], password [email or web address] for any overnight needs.  09/24/2014, 2:47 PM

## 2014-09-24 NOTE — Progress Notes (Signed)
Subjective: Pt confused with mittens on, NG in place, he has just had a large loose BM.    Objective: Vital signs in last 24 hours: Temp:  [98.1 F (36.7 C)-98.7 F (37.1 C)] 98.3 F (36.8 C) (03/01 0628) Pulse Rate:  [82-95] 82 (03/01 0628) Resp:  [13-18] 16 (03/01 0628) BP: (115-155)/(69-86) 131/77 mmHg (03/01 0628) SpO2:  [89 %-94 %] 94 % (03/01 0628) Weight:  [177 lb 7.5 oz (80.5 kg)] 177 lb 7.5 oz (80.5 kg) (03/01 0628) Last BM Date: 09/22/14 + BM recorded this AM 350 from NG tube Afebrile, VSS K+3.2 WBC is better. H/H down with hydration Film this AM shows:  Advancement of NG tube with the tip in the proximal stomach and side port near the GE junction.  Mildly dilated small bowel loops with air-fluid levels, slightly improved since prior study.  Intake/Output from previous day: 02/29 0701 - 03/01 0700 In: 30 [NG/GT:30] Out: 450 [Urine:100; Emesis/NG output:350] Intake/Output this shift:    General appearance: alert and confused, thinks [place]. GI: soft, + BS, large BM  Lab Results:   Recent Labs  09/23/14 1639 09/24/14 0829  WBC 13.0* 10.3  HGB 14.2 12.0*  HCT 43.3 36.6*  PLT 301 289    BMET  Recent Labs  09/23/14 1639 09/24/14 0829  NA 154* 136  K 3.7 3.2*  CL 83* 104  CO2 23 27  GLUCOSE 114* 120*  BUN 40* 41*  CREATININE 1.18 1.14  CALCIUM 7.7* 10.5   PT/INR No results for input(s): LABPROT, INR in the last 72 hours.   Recent Labs Lab 09/23/14 1639 09/24/14 0829  AST 31 20  ALT 17 18  ALKPHOS 52 50  BILITOT 1.1 0.9  PROT 6.1 5.3*  ALBUMIN 3.5 3.2*     Lipase     Component Value Date/Time   LIPASE 18 09/23/2014 1639     Studies/Results: Ct Abdomen Pelvis W Contrast  09/23/2014   CLINICAL DATA:  Nausea, vomiting, diarrhea for 3 days  EXAM: CT ABDOMEN AND PELVIS WITH CONTRAST  TECHNIQUE: Multidetector CT imaging of the abdomen and pelvis was performed using the standard protocol following bolus administration of  intravenous contrast.  CONTRAST:  75mL OMNIPAQUE IOHEXOL 300 MG/ML SOLN, 152mL OMNIPAQUE IOHEXOL 300 MG/ML SOLN  COMPARISON:  Abdominal radiographs same date, CT abdomen/ pelvis 07/29/2012  FINDINGS: Lower chest: Bilateral lower lobe curvilinear atelectasis or scarring noted. Trace left pleural fluid is present.  Hepatobiliary: Sub cm too small to characterize hepatic hypodense lesions are reidentified. Cholecystectomy clips are noted.  Pancreas: Pancreatic calcifications are identified, some new since the prior exam. This may signify interval pancreatitis or progression of adjacent vascular calcification with volume averaging. No pancreatic ductal dilatation or peripancreatic collection is identified.  Spleen: Normal  Adrenals/Urinary Tract: Mild bilateral adrenal fullness is reidentified without measurable mass. The right adrenal gland is imaged at an angle, simulating a mass. Nonobstructing 7 mm dominant right mid renal calculus identified. Bilateral renal cortical cysts and too small to characterize hypodense lesions are identified, largest 1.9 cm at the mid left kidney image 42. No hydroureteronephrosis. Retro aortic left renal vein.  Stomach/Bowel: Diffuse small bowel dilatation is identified to the level of the distal terminal ileum, with apparent focal transition point image 67 series 2, image 60 series 4, and image 49 series 5. Maximal small bowel diameter measures 4.2 cm image 54. The appendix is not identified. A small amount of free fluid is noted in the pelvis. Colon is decompressed  and unremarkable with the exception of a few colonic diverticuli without evidence for diverticulitis.  Vascular/Lymphatic: Severe atheromatous aortic calcification without aneurysm. No lymphadenopathy.  Reproductive: Penile implant partly visualized. Prostate presumed surgically absent with probable TURP defect.  Musculoskeletal: Multilevel disc degenerative changes noted in the spine. No lytic or sclerotic osseous lesion is  identified.  Other: No free air.  IMPRESSION: Distal small bowel obstruction with focal transition point at the level of the distal terminal ileum but no mass identified. This could indicate adhesion or stricture. A mass could be obscured.  Pelvic free fluid noted without free air identified.   Electronically Signed   By: Conchita Paris M.D.   On: 09/23/2014 20:11   Dg Abd 2 Views  09/24/2014   CLINICAL DATA:  Small bowel obstruction, diarrhea, abdominal pain.  EXAM: ABDOMEN - 2 VIEW  COMPARISON:  09/23/2014  FINDINGS: NG tube tip is in the proximal stomach with the side port near the GE junction. Mild prominence of small bowel loops with scattered air-fluid levels. This has improved slightly since prior study. Gas within nondistended colon. Prior cholecystectomy.  IMPRESSION: Advancement of NG tube with the tip in the proximal stomach and side port near the GE junction.  Mildly dilated small bowel loops with air-fluid levels, slightly improved since prior study.   Electronically Signed   By: Rolm Baptise M.D.   On: 09/24/2014 09:24   Dg Abd Acute W/chest  09/23/2014   CLINICAL DATA:  Back pain  EXAM: ACUTE ABDOMEN SERIES (ABDOMEN 2 VIEW & CHEST 1 VIEW)  COMPARISON:  None.  FINDINGS: Normal heart size. Calcified atherosclerotic plaque involves the thoracic aorta. Left midlung scarring noted. There is asymmetric elevation of the right hemidiaphragm. Dilated loops of small bowel are identified measuring up to 5.6 cm. Dynamic air-fluid levels are identified on the left lateral decubitus radiograph no free intraperitoneal air are identified.  IMPRESSION: 1. Small bowel obstruction pattern identified. No free intraperitoneal air are identified.   Electronically Signed   By: Kerby Moors M.D.   On: 09/23/2014 16:51   Dg Abd Portable 1v  09/23/2014   CLINICAL DATA:  Nasogastric tube Check  EXAM: PORTABLE ABDOMEN - 1 VIEW  COMPARISON:  Abdominal CT from the same day  FINDINGS: Nasogastric tube tip is at the level  of the distal esophagus, and for the side port to reach the stomach advancement by 13 cm required.  Known small bowel obstruction. There is normal appearing urinary contrast excretion. Incidentally low bladder in the setting of prostatectomy.  These results were called by telephone at the time of interpretation on 09/23/2014 at 10:26 pm to [facility], who verbally acknowledged these results.  IMPRESSION: Nasogastric tube tip at the distal esophagus. Advancement by 13 cm required for the side port to reach the stomach.   Electronically Signed   By: Monte Fantasia M.D.   On: 09/23/2014 22:28    Medications: . antiseptic oral rinse  7 mL Mouth Rinse q12n4p  . chlorhexidine  15 mL Mouth Rinse BID   . dextrose 100 mL/hr at 09/24/14 0341   Prior to Admission medications   Medication Sig Start Date End Date Taking? Authorizing Provider  acetaminophen (TYLENOL) 500 MG tablet Take 1,000 mg by mouth at bedtime.   Yes Historical Provider, MD  Ascorbic Acid (VITAMIN C PO) Take 2 capsules by mouth daily.   Yes Historical Provider, MD  Melatonin 5 MG CAPS Take 15 capsules by mouth at bedtime.   Yes Historical Provider,  MD  Multiple Vitamins-Minerals (MULTIVITAMIN WITH MINERALS) tablet Take 1 tablet by mouth daily.   Yes Historical Provider, MD  Omega-3 Fatty Acids (FISH OIL PO) Take 2 capsules by mouth every evening.   Yes Historical Provider, MD  [facility] Wort 300 MG TABS Take 1 tablet by mouth at bedtime.   Yes Historical Provider, MD  TURMERIC PO Take 1,000 mg by mouth daily.   Yes Historical Provider, MD  levofloxacin (LEVAQUIN) 500 MG tablet Take 1 tablet (500 mg total) by mouth daily. Patient not taking: Reported on 09/23/2014 09/26/12   Hoy Morn, MD     Assessment/Plan SBO, hx of colon resection, prostatectomy, and cholecystectomy. Hx of thoracotomy for thymoma Hx of prostate cancer Hx of stroke/dementia - currently very acute. Hx of hyper parathyroidism  No antibiotics DVT:  Currently on  on chemical prophylaxis, I have added SCD's.   Plan:  I will clamp his NG and let him have ice chips.   See how he does. He is very confused.    LOS: 1 day    Merton Wadlow 09/24/2014

## 2014-09-25 ENCOUNTER — Inpatient Hospital Stay (HOSPITAL_COMMUNITY): Payer: Medicare Other

## 2014-09-25 DIAGNOSIS — F0391 Unspecified dementia with behavioral disturbance: Secondary | ICD-10-CM

## 2014-09-25 DIAGNOSIS — E876 Hypokalemia: Secondary | ICD-10-CM

## 2014-09-25 LAB — CBC
HEMATOCRIT: 38.3 % — AB (ref 39.0–52.0)
Hemoglobin: 12.5 g/dL — ABNORMAL LOW (ref 13.0–17.0)
MCH: 31.3 pg (ref 26.0–34.0)
MCHC: 32.6 g/dL (ref 30.0–36.0)
MCV: 95.8 fL (ref 78.0–100.0)
Platelets: 298 10*3/uL (ref 150–400)
RBC: 4 MIL/uL — ABNORMAL LOW (ref 4.22–5.81)
RDW: 13.4 % (ref 11.5–15.5)
WBC: 7 10*3/uL (ref 4.0–10.5)

## 2014-09-25 LAB — BASIC METABOLIC PANEL
Anion gap: 3 — ABNORMAL LOW (ref 5–15)
BUN: 35 mg/dL — ABNORMAL HIGH (ref 6–23)
CO2: 30 mmol/L (ref 19–32)
Calcium: 10.2 mg/dL (ref 8.4–10.5)
Chloride: 102 mmol/L (ref 96–112)
Creatinine, Ser: 1.27 mg/dL (ref 0.50–1.35)
GFR calc Af Amer: 57 mL/min — ABNORMAL LOW (ref >90)
GFR calc non Af Amer: 49 mL/min — ABNORMAL LOW (ref >90)
GLUCOSE: 127 mg/dL — AB (ref 70–99)
Potassium: 3.3 mmol/L — ABNORMAL LOW (ref 3.5–5.1)
SODIUM: 135 mmol/L (ref 135–145)

## 2014-09-25 LAB — GLUCOSE, CAPILLARY: Glucose-Capillary: 99 mg/dL (ref 70–99)

## 2014-09-25 MED ORDER — SODIUM CHLORIDE 0.9 % IV SOLN
INTRAVENOUS | Status: DC
  Administered 2014-09-25 – 2014-09-28 (×5): via INTRAVENOUS
  Administered 2014-09-28: 1000 mL via INTRAVENOUS
  Administered 2014-09-29 – 2014-09-30 (×4): via INTRAVENOUS

## 2014-09-25 MED ORDER — POTASSIUM CHLORIDE 10 MEQ/100ML IV SOLN
10.0000 meq | INTRAVENOUS | Status: AC
  Administered 2014-09-25 (×2): 10 meq via INTRAVENOUS
  Filled 2014-09-25 (×2): qty 100

## 2014-09-25 NOTE — Progress Notes (Signed)
Subjective: He complains of neck pain it is more muscular than from the NG.  He is just sad, he doesn't really understand what is occuring.  He has NG, foley, and a flexiseal in.  The Ng didn't have much, I flushed it with 100 ml and it comes back clear.  He is still having loose stools.  Abdomen still seems distended.  He has mitts on, he was out of one when I came in, but wasn't really bothering anything.  He just moans some and is asking for water now.    Objective: Vital signs in last 24 hours: Temp:  [97.4 F (36.3 C)-99 F (37.2 C)] 99 F (37.2 C) (03/02 0535) Pulse Rate:  [80-88] 84 (03/02 0535) Resp:  [16-17] 16 (03/02 0535) BP: (114-140)/(63-76) 131/76 mmHg (03/02 0535) SpO2:  [94 %-96 %] 95 % (03/02 0535) Weight:  [179 lb 3.7 oz (81.3 kg)] 179 lb 3.7 oz (81.3 kg) (03/02 0535) Last BM Date: 09/24/14 9 stools recorded yesterday, Flexiseal placed last PM Afebrile, VSS K+ 3.3 WBC 7.0 C diff is negative Intake/Output from previous day: 03/01 0701 - 03/02 0700 In: -  Out: 1200 [Urine:600; Emesis/NG output:600] Intake/Output this shift:    General appearance: alert, cooperative and very confused and clearly feels bad. GI: abd is still a bit distended, hyperactive bowel sounds, NG is clear now.  He isn't really tender to palpation.  Lab Results:   Recent Labs  09/24/14 0829 09/25/14 0436  WBC 10.3 7.0  HGB 12.0* 12.5*  HCT 36.6* 38.3*  PLT 289 298    BMET  Recent Labs  09/24/14 1140 09/25/14 0436  NA 136 135  K 3.4* 3.3*  CL 104 102  CO2 26 30  GLUCOSE 128* 127*  BUN 42* 35*  CREATININE 1.25 1.27  CALCIUM 10.5 10.2   PT/INR No results for input(s): LABPROT, INR in the last 72 hours.   Recent Labs Lab 09/23/14 1639 09/24/14 0829  AST 31 20  ALT 17 18  ALKPHOS 52 50  BILITOT 1.1 0.9  PROT 6.1 5.3*  ALBUMIN 3.5 3.2*     Lipase     Component Value Date/Time   LIPASE 18 09/23/2014 1639     Studies/Results: Ct Abdomen Pelvis W  Contrast  09/23/2014   CLINICAL DATA:  Nausea, vomiting, diarrhea for 3 days  EXAM: CT ABDOMEN AND PELVIS WITH CONTRAST  TECHNIQUE: Multidetector CT imaging of the abdomen and pelvis was performed using the standard protocol following bolus administration of intravenous contrast.  CONTRAST:  67mL OMNIPAQUE IOHEXOL 300 MG/ML SOLN, 129mL OMNIPAQUE IOHEXOL 300 MG/ML SOLN  COMPARISON:  Abdominal radiographs same date, CT abdomen/ pelvis 07/29/2012  FINDINGS: Lower chest: Bilateral lower lobe curvilinear atelectasis or scarring noted. Trace left pleural fluid is present.  Hepatobiliary: Sub cm too small to characterize hepatic hypodense lesions are reidentified. Cholecystectomy clips are noted.  Pancreas: Pancreatic calcifications are identified, some new since the prior exam. This may signify interval pancreatitis or progression of adjacent vascular calcification with volume averaging. No pancreatic ductal dilatation or peripancreatic collection is identified.  Spleen: Normal  Adrenals/Urinary Tract: Mild bilateral adrenal fullness is reidentified without measurable mass. The right adrenal gland is imaged at an angle, simulating a mass. Nonobstructing 7 mm dominant right mid renal calculus identified. Bilateral renal cortical cysts and too small to characterize hypodense lesions are identified, largest 1.9 cm at the mid left kidney image 42. No hydroureteronephrosis. Retro aortic left renal vein.  Stomach/Bowel: Diffuse small bowel dilatation is  identified to the level of the distal terminal ileum, with apparent focal transition point image 67 series 2, image 60 series 4, and image 49 series 5. Maximal small bowel diameter measures 4.2 cm image 54. The appendix is not identified. A small amount of free fluid is noted in the pelvis. Colon is decompressed and unremarkable with the exception of a few colonic diverticuli without evidence for diverticulitis.  Vascular/Lymphatic: Severe atheromatous aortic calcification  without aneurysm. No lymphadenopathy.  Reproductive: Penile implant partly visualized. Prostate presumed surgically absent with probable TURP defect.  Musculoskeletal: Multilevel disc degenerative changes noted in the spine. No lytic or sclerotic osseous lesion is identified.  Other: No free air.  IMPRESSION: Distal small bowel obstruction with focal transition point at the level of the distal terminal ileum but no mass identified. This could indicate adhesion or stricture. A mass could be obscured.  Pelvic free fluid noted without free air identified.   Electronically Signed   By: Conchita Paris M.D.   On: 09/23/2014 20:11   Dg Abd 2 Views  09/24/2014   CLINICAL DATA:  Small bowel obstruction, diarrhea, abdominal pain.  EXAM: ABDOMEN - 2 VIEW  COMPARISON:  09/23/2014  FINDINGS: NG tube tip is in the proximal stomach with the side port near the GE junction. Mild prominence of small bowel loops with scattered air-fluid levels. This has improved slightly since prior study. Gas within nondistended colon. Prior cholecystectomy.  IMPRESSION: Advancement of NG tube with the tip in the proximal stomach and side port near the GE junction.  Mildly dilated small bowel loops with air-fluid levels, slightly improved since prior study.   Electronically Signed   By: Rolm Baptise M.D.   On: 09/24/2014 09:24   Dg Abd Acute W/chest  09/23/2014   CLINICAL DATA:  Back pain  EXAM: ACUTE ABDOMEN SERIES (ABDOMEN 2 VIEW & CHEST 1 VIEW)  COMPARISON:  None.  FINDINGS: Normal heart size. Calcified atherosclerotic plaque involves the thoracic aorta. Left midlung scarring noted. There is asymmetric elevation of the right hemidiaphragm. Dilated loops of small bowel are identified measuring up to 5.6 cm. Dynamic air-fluid levels are identified on the left lateral decubitus radiograph no free intraperitoneal air are identified.  IMPRESSION: 1. Small bowel obstruction pattern identified. No free intraperitoneal air are identified.    Electronically Signed   By: Kerby Moors M.D.   On: 09/23/2014 16:51   Dg Abd Portable 1v  09/23/2014   CLINICAL DATA:  Nasogastric tube Check  EXAM: PORTABLE ABDOMEN - 1 VIEW  COMPARISON:  Abdominal CT from the same day  FINDINGS: Nasogastric tube tip is at the level of the distal esophagus, and for the side port to reach the stomach advancement by 13 cm required.  Known small bowel obstruction. There is normal appearing urinary contrast excretion. Incidentally low bladder in the setting of prostatectomy.  These results were called by telephone at the time of interpretation on 09/23/2014 at 10:26 pm to [facility], who verbally acknowledged these results.  IMPRESSION: Nasogastric tube tip at the distal esophagus. Advancement by 13 cm required for the side port to reach the stomach.   Electronically Signed   By: Monte Fantasia M.D.   On: 09/23/2014 22:28    Medications: . antiseptic oral rinse  7 mL Mouth Rinse q12n4p  . chlorhexidine  15 mL Mouth Rinse BID    Assessment/Plan SBO, hx of colon resection, prostatectomy, and cholecystectomy. Hx of thoracotomy for thymoma Hx of prostate cancer Hx of stroke/dementia -  currently very acute. Hx of hyper parathyroidism No antibiotics DVT: Currently on on chemical prophylaxis/SCD's. Hypokalemia   Plan:  I am going to get a film, I think he is better from the distension, but still somewhat distended.   NG is clear currently.  LOS: 2 days    Crystale Giannattasio 09/25/2014

## 2014-09-25 NOTE — Evaluation (Signed)
Physical Therapy Evaluation Patient Details Name: Jose Crawford MRN: [id number] DOB: January 05, 1926 Today's Date: 09/25/2014   History of Present Illness  81 y.o. male with a PMH of dementia, thymoma status post left thoracotomy, prostate cancer in remission status post prostatectomy, history of laparoscopic cholecystectomy who was admitted 09/23/14 with chief complaint of abdominal pain, nausea and vomiting and found to have SBO  Clinical Impression  Pt admitted with above diagnosis. Pt currently with functional limitations due to the deficits listed below (see PT Problem List).  Pt will benefit from skilled PT to increase their independence and safety with mobility to allow discharge to the venue listed below.  Pt requiring total assist at this time for bed mobility and became dizzy with sitting upright so requested return to supine.  Recommend SNF upon d/c.     Follow Up Recommendations SNF;Supervision/Assistance - 24 hour    Equipment Recommendations  None recommended by PT    Recommendations for Other Services       Precautions / Restrictions Precautions Precautions: Fall Precaution Comments: NG tube, flexiseal      Mobility  Bed Mobility Overal bed mobility: Needs Assistance;+2 for physical assistance Bed Mobility: Rolling;Sidelying to Sit;Sit to Sidelying Rolling: +2 for physical assistance;Total assist Sidelying to sit: +2 for physical assistance;Total assist     Sit to sidelying: +2 for physical assistance;Total assist General bed mobility comments: pt attempting to assist however unable possibly due to weakness and pain, unable to tolerate sitting more then 30 sec, stated yes to dizziness upon sitting  Transfers Overall transfer level:  (unable at this time)                  Ambulation/Gait                Stairs            Wheelchair Mobility    Modified Rankin (Stroke Patients Only)       Balance Overall balance assessment: Needs  assistance Sitting-balance support: Bilateral upper extremity supported Sitting balance-Leahy Scale: Fair Sitting balance - Comments: able to sit EOB without trunk support                                     Pertinent Vitals/Pain Pain Assessment: Faces Faces Pain Scale: Hurts whole lot Pain Location: not able to state Pain Descriptors / Indicators: Grimacing;Moaning Pain Intervention(s): Limited activity within patient's tolerance;Monitored during session;Repositioned    Home Living Family/patient expects to be discharged to:: Private residence Living Arrangements: Spouse/significant other ("family", daughter per chart)             Home Equipment: Environmental consultant - 2 wheels Additional Comments: pt not able to describe home environment but states he lives with family    Prior Function Level of Independence: Needs assistance   Gait / Transfers Assistance Needed: states he mostly stays in bed, transfers to [facility] with RW modified independently however uncertain of accuracy           Hand Dominance        Extremity/Trunk Assessment   Upper Extremity Assessment: Generalized weakness           Lower Extremity Assessment: Generalized weakness         Communication      Cognition Arousal/Alertness: Awake/alert Behavior During Therapy: WFL for tasks assessed/performed Overall Cognitive Status: No family/caregiver present to determine baseline cognitive functioning (hx dementia)  General Comments      Exercises        Assessment/Plan    PT Assessment Patient needs continued PT services  PT Diagnosis Generalized weakness   PT Problem List Decreased strength;Decreased activity tolerance;Decreased mobility;Decreased balance;Decreased cognition;Pain  PT Treatment Interventions DME instruction;Gait training;Patient/family education;Functional mobility training;Therapeutic activities;Therapeutic exercise;Balance  training;Neuromuscular re-education   PT Goals (Current goals can be found in the Care Plan section) Acute Rehab PT Goals PT Goal Formulation: Patient unable to participate in goal setting Time For Goal Achievement: 10/02/14 Potential to Achieve Goals: Fair    Frequency Min 3X/week   Barriers to discharge        Co-evaluation               End of Session   Activity Tolerance: Patient limited by pain;Patient limited by fatigue Patient left: in bed;with call bell/phone within reach;with bed alarm set;with nursing/sitter in room           Time: 2703-5009 PT Time Calculation (min) (ACUTE ONLY): 18 min   Charges:   PT Evaluation $Initial PT Evaluation Tier I: 1 Procedure     PT G Codes:        Jose Crawford,Jose Crawford 09/25/2014, 3:17 PM Jose Crawford, PT, DPT 09/25/2014 Pager: [phone]

## 2014-09-25 NOTE — Progress Notes (Signed)
Progress Note   Jose Crawford [id number]:[id number] DOB: 28-Nov-1925 DOA: 09/23/2014 PCP: [place]   Brief Narrative:   Jose Crawford is an 81 y.o. male with a PMH of dementia, thymoma status post left thoracotomy, prostate cancer in remission status post prostatectomy, history of laparoscopic cholecystectomy who was admitted 09/23/14 with chief complaint of abdominal pain, nausea and vomiting. CT scan done on admission showed a small bowel obstruction with a transition point. Surgery is following.  Assessment/Plan:   Principal Problem:   SBO (small bowel obstruction) - Surgery following. Continue NPO.  Clamp NG (large BM this a.m.) - Continue IVF. - Continue pain control efforts. - Repeat abdominal film performed on 09/25/2014 showing no significant change in degree of small bowel dilatation, appearing to be consistent with partial small bowel obstruction -He continues to have stool output, NG tube remains in place having 600 mL output in the past 24 hours -Await further recommendations from general surgery.  Active Problems: Hypokalemia -Will give 2 rounds of IV potassium today.    Dementia - Patient with history of advanced dementia, becoming agitated during this hospitalization    Hypocalcemia - Follow-up ionized calcium.    Hypernatremia - Likely from dehydration. - Continue hypotonic IV fluids, follow-up BMET ordered for 12:00.    DVT Prophylaxis - Continue SCDs.  Code Status: Full. Family Communication: No family at the bedside.   Luanna Salk [phone]), daughter called updated on patient's condition Disposition Plan: May need SNF, lives with daugther.  PT evaluation requested.   IV Access:    Peripheral IV   Procedures and diagnostic studies:   Ct Abdomen Pelvis W Contrast 09/23/2014: Distal small bowel obstruction with focal transition point at the level of the distal terminal ileum but no mass identified. This could  indicate adhesion or stricture. A mass could be obscured.  Pelvic free fluid noted without free air identified.    Dg Abd Acute W/chest 09/23/2014: 1. Small bowel obstruction pattern identified. No free intraperitoneal air are identified.     Dg Abd Portable 1v 09/23/2014: Nasogastric tube tip at the distal esophagus. Advancement by 13 cm required for the side port to reach the stomach.     Medical Consultants:    Jackolyn Confer, MD, Surgery  Anti-Infectives:    None.  Subjective:    Jose Crawford appears somewhat agitated this morning, poor historian  Objective:    Filed Vitals:   09/24/14 0628 09/24/14 1540 09/24/14 2034 09/25/14 0535  BP: 131/77 140/74 114/63 131/76  Pulse: 82 80 88 84  Temp: 98.3 F (36.8 C) 98.1 F (36.7 C) 97.4 F (36.3 C) 99 F (37.2 C)  TempSrc: Oral Oral Oral Oral  Resp: 16 17 16 16   Height:      Weight: 80.5 kg (177 lb 7.5 oz)   81.3 kg (179 lb 3.7 oz)  SpO2: 94% 94% 96% 95%    Intake/Output Summary (Last 24 hours) at 09/25/14 1305 Last data filed at 09/25/14 0500  Gross per 24 hour  Intake      0 ml  Output    600 ml  Net   -600 ml    Exam: Gen:  Groaning out, safety mittens on Cardiovascular:  RRR, No M/R/G Respiratory:  Lungs CTAB Gastrointestinal:  Abdomen soft, distended+ BS Extremities:  No C/E/C   Data Reviewed:    Labs: Basic Metabolic Panel:  Recent Labs Lab 09/23/14 1639 09/24/14 0829 09/24/14 1140 09/25/14 0436  NA 154*  136 136 135  K 3.7 3.2* 3.4* 3.3*  CL 83* 104 104 102  CO2 23 27 26 30   GLUCOSE 114* 120* 128* 127*  BUN 40* 41* 42* 35*  CREATININE 1.18 1.14 1.25 1.27  CALCIUM 7.7* 10.5 10.5 10.2   GFR Estimated Creatinine Clearance: 45.8 mL/min (by C-G formula based on Cr of 1.27). Liver Function Tests:  Recent Labs Lab 09/23/14 1639 09/24/14 0829  AST 31 20  ALT 17 18  ALKPHOS 52 50  BILITOT 1.1 0.9  PROT 6.1 5.3*  ALBUMIN 3.5 3.2*    Recent Labs Lab 09/23/14 1639  LIPASE 18    CBC:  Recent Labs Lab 09/23/14 1639 09/24/14 0829 09/25/14 0436  WBC 13.0* 10.3 7.0  NEUTROABS 10.9* 8.3*  --   HGB 14.2 12.0* 12.5*  HCT 43.3 36.6* 38.3*  MCV 96.4 96.8 95.8  PLT 301 289 298   Cardiac Enzymes:  Recent Labs Lab 09/23/14 1639  TROPONINI <0.03   CBG:  Recent Labs Lab 09/24/14 0041 09/24/14 0627 09/24/14 1807 09/25/14 0019  GLUCAP 106* 118* 104* 99   Sepsis Labs:  Recent Labs Lab 09/23/14 1639 09/23/14 1659 09/24/14 0829 09/25/14 0436  WBC 13.0*  --  10.3 7.0  LATICACIDVEN  --  1.18  --   --    Microbiology Recent Results (from the past 240 hour(s))  Clostridium Difficile by PCR     Status: None   Collection Time: 09/24/14 10:56 AM  Result Value Ref Range Status   C difficile by pcr NEGATIVE NEGATIVE Final     Medications:   . antiseptic oral rinse  7 mL Mouth Rinse q12n4p  . chlorhexidine  15 mL Mouth Rinse BID   Continuous Infusions:    Time spent: 25 minutes.   LOS: 2 days   Kelvin Cellar  [facility] Pager [phone]. If unable to reach me by pager, please call my cell phone at [phone].  *Please refer to [email or web address], password [email or web address] to get updated schedule on who will round on this patient, as hospitalists switch teams weekly. If 7PM-7AM, please contact night-coverage at [email or web address], password [email or web address] for any overnight needs.  09/25/2014, 1:05 PM

## 2014-09-26 ENCOUNTER — Inpatient Hospital Stay (HOSPITAL_COMMUNITY): Payer: Medicare Other

## 2014-09-26 DIAGNOSIS — E870 Hyperosmolality and hypernatremia: Secondary | ICD-10-CM

## 2014-09-26 LAB — BASIC METABOLIC PANEL
Anion gap: 7 (ref 5–15)
BUN: 32 mg/dL — ABNORMAL HIGH (ref 6–23)
CHLORIDE: 106 mmol/L (ref 96–112)
CO2: 26 mmol/L (ref 19–32)
Calcium: 10.3 mg/dL (ref 8.4–10.5)
Creatinine, Ser: 1.11 mg/dL (ref 0.50–1.35)
GFR calc Af Amer: 67 mL/min — ABNORMAL LOW (ref >90)
GFR calc non Af Amer: 58 mL/min — ABNORMAL LOW (ref >90)
GLUCOSE: 88 mg/dL (ref 70–99)
POTASSIUM: 2.9 mmol/L — AB (ref 3.5–5.1)
SODIUM: 139 mmol/L (ref 135–145)

## 2014-09-26 LAB — CBC
HEMATOCRIT: 35.4 % — AB (ref 39.0–52.0)
Hemoglobin: 11.6 g/dL — ABNORMAL LOW (ref 13.0–17.0)
MCH: 31.4 pg (ref 26.0–34.0)
MCHC: 32.8 g/dL (ref 30.0–36.0)
MCV: 95.7 fL (ref 78.0–100.0)
PLATELETS: 256 10*3/uL (ref 150–400)
RBC: 3.7 MIL/uL — ABNORMAL LOW (ref 4.22–5.81)
RDW: 13.3 % (ref 11.5–15.5)
WBC: 5.8 10*3/uL (ref 4.0–10.5)

## 2014-09-26 LAB — GLUCOSE, CAPILLARY: GLUCOSE-CAPILLARY: 75 mg/dL (ref 70–99)

## 2014-09-26 MED ORDER — HEPARIN SODIUM (PORCINE) 5000 UNIT/ML IJ SOLN
5000.0000 [IU] | Freq: Three times a day (TID) | INTRAMUSCULAR | Status: DC
  Administered 2014-09-26 – 2014-10-03 (×19): 5000 [IU] via SUBCUTANEOUS
  Filled 2014-09-26 (×24): qty 1

## 2014-09-26 MED ORDER — POTASSIUM CHLORIDE 10 MEQ/100ML IV SOLN
10.0000 meq | INTRAVENOUS | Status: AC
  Administered 2014-09-26 (×4): 10 meq via INTRAVENOUS
  Filled 2014-09-26 (×4): qty 100

## 2014-09-26 NOTE — Progress Notes (Signed)
Progress Note   SIPRIANO FENDLEY SJG:[id number] DOB: Apr 06, 1926 DOA: 09/23/2014 PCP: [place]   Brief Narrative:   Jose Crawford is an 81 y.o. male with a PMH of dementia, thymoma status post left thoracotomy, prostate cancer in remission status post prostatectomy, history of laparoscopic cholecystectomy who was admitted 09/23/14 with chief complaint of abdominal pain, nausea and vomiting. CT scan done on admission showed a small bowel obstruction with a transition point. Surgery is following.  Assessment/Plan:   Principal Problem:   SBO (small bowel obstruction) - Surgery following. Continue NPO.   - Continue IVF with NS at 75 mL/hr. - Continue pain control efforts. - Repeat abdominal film performed on 09/25/2014 showing no significant change in degree of small bowel dilatation, appearing to be consistent with partial small bowel obstruction -He continues to have stool output, NG tube remains in place having 700 mL output in the past 24 hours -Little improvement since yesterday  Active Problems:  Hypokalemia -Likely secondary to GI loss -Despite giving 20 meq of IV potassium on 09/25/2014, potassium levels coming down to 2.9 on AM labs -Will administer 40 meq of IV KCl today     Dementia - Patient with history of advanced dementia, becoming agitated during this hospitalization    Hypocalcemia - Follow-up ionized calcium.    Hypernatremia - Likely from dehydration. Improved with IV fluids - Na=139 on 09/26/2014    DVT Prophylaxis - Continue SCDs.  Code Status: Full. Family Communication: No family at the bedside.   Luanna Salk [phone]), daughter called updated on patient's condition Disposition Plan: May need SNF, lives with daugther.  PT evaluation requested.   IV Access:    Peripheral IV   Procedures and diagnostic studies:   Ct Abdomen Pelvis W Contrast 09/23/2014: Distal small bowel obstruction with focal transition point  at the level of the distal terminal ileum but no mass identified. This could indicate adhesion or stricture. A mass could be obscured.  Pelvic free fluid noted without free air identified.    Dg Abd Acute W/chest 09/23/2014: 1. Small bowel obstruction pattern identified. No free intraperitoneal air are identified.     Dg Abd Portable 1v 09/23/2014: Nasogastric tube tip at the distal esophagus. Advancement by 13 cm required for the side port to reach the stomach.     Medical Consultants:    Jackolyn Confer, MD, Surgery  Anti-Infectives:    None.  Subjective:    AESON SAWYERS appears less agitated, reports ongoing abdominal pain  Objective:    Filed Vitals:   09/25/14 1351 09/25/14 2144 09/26/14 0529 09/26/14 1441  BP: 146/71 125/70 147/78 141/67  Pulse: 80 86 87 77  Temp: 98.1 F (36.7 C) 98 F (36.7 C) 98 F (36.7 C) 97.4 F (36.3 C)  TempSrc: Oral Oral Oral Oral  Resp: 16 16 16 16   Height:      Weight:      SpO2: 100%  91% 96%    Intake/Output Summary (Last 24 hours) at 09/26/14 1721 Last data filed at 09/26/14 1500  Gross per 24 hour  Intake      0 ml  Output   1200 ml  Net  -1200 ml    Exam: Gen:  Groaning out, safety mittens on Cardiovascular:  RRR, No M/R/G Respiratory:  Lungs CTAB Gastrointestinal:  Abdomen soft, distended+ BS Extremities:  No C/E/C   Data Reviewed:    Labs: Basic Metabolic Panel:  Recent Labs Lab 09/23/14 1639  09/24/14 0829 09/24/14 1140 09/25/14 0436 09/26/14 0352  NA 154* 136 136 135 139  K 3.7 3.2* 3.4* 3.3* 2.9*  CL 83* 104 104 102 106  CO2 23 27 26 30 26   GLUCOSE 114* 120* 128* 127* 88  BUN 40* 41* 42* 35* 32*  CREATININE 1.18 1.14 1.25 1.27 1.11  CALCIUM 7.7* 10.5 10.5 10.2 10.3   GFR Estimated Creatinine Clearance: 51.5 mL/min (by C-G formula based on Cr of 1.11). Liver Function Tests:  Recent Labs Lab 09/23/14 1639 09/24/14 0829  AST 31 20  ALT 17 18  ALKPHOS 52 50  BILITOT 1.1 0.9  PROT 6.1 5.3*   ALBUMIN 3.5 3.2*    Recent Labs Lab 09/23/14 1639  LIPASE 18   CBC:  Recent Labs Lab 09/23/14 1639 09/24/14 0829 09/25/14 0436 09/26/14 0352  WBC 13.0* 10.3 7.0 5.8  NEUTROABS 10.9* 8.3*  --   --   HGB 14.2 12.0* 12.5* 11.6*  HCT 43.3 36.6* 38.3* 35.4*  MCV 96.4 96.8 95.8 95.7  PLT 301 289 298 256   Cardiac Enzymes:  Recent Labs Lab 09/23/14 1639  TROPONINI <0.03   CBG:  Recent Labs Lab 09/24/14 0041 09/24/14 0627 09/24/14 1807 09/25/14 0019 09/26/14 0533  GLUCAP 106* 118* 104* 99 75   Sepsis Labs:  Recent Labs Lab 09/23/14 1639 09/23/14 1659 09/24/14 0829 09/25/14 0436 09/26/14 0352  WBC 13.0*  --  10.3 7.0 5.8  LATICACIDVEN  --  1.18  --   --   --    Microbiology Recent Results (from the past 240 hour(s))  Clostridium Difficile by PCR     Status: None   Collection Time: 09/24/14 10:56 AM  Result Value Ref Range Status   C difficile by pcr NEGATIVE NEGATIVE Final     Medications:   . antiseptic oral rinse  7 mL Mouth Rinse q12n4p  . chlorhexidine  15 mL Mouth Rinse BID  . heparin subcutaneous  5,000 Units Subcutaneous 3 times per day   Continuous Infusions: . sodium chloride 75 mL/hr at 09/26/14 0354    Time spent: 20 minutes.   LOS: 3 days   Kelvin Cellar  [facility] Pager [phone]. If unable to reach me by pager, please call my cell phone at [phone].  *Please refer to [email or web address], password [email or web address] to get updated schedule on who will round on this patient, as hospitalists switch teams weekly. If 7PM-7AM, please contact night-coverage at [email or web address], password [email or web address] for any overnight needs.  09/26/2014, 5:21 PM

## 2014-09-26 NOTE — Progress Notes (Signed)
Subjective: He remains very confused he pulled his NG out last PM and it was replaced.  The suction isn't really working.  It will pull on regular, but not on intermittent.  I did advance his NG tube about 2 inches.    Objective: Vital signs in last 24 hours: Temp:  [98 F (36.7 C)-98.1 F (36.7 C)] 98 F (36.7 C) (03/03 0529) Pulse Rate:  [80-87] 87 (03/03 0529) Resp:  [16] 16 (03/03 0529) BP: (125-147)/(70-78) 147/78 mmHg (03/03 0529) SpO2:  [91 %-100 %] 91 % (03/03 0529) Last BM Date: 09/26/14 100  From NG Stool 500 Afebrile, VSS K+ 2.9 Normal WBC Film:  1. Nasogastric tube tip at the proximal stomach, with side port at the GE junction. Advancement by 5 cm would secure positioning. 2. Ongoing small bowel obstruction. Intake/Output from previous day: 03/02 0701 - 03/03 0700 In: 0  Out: 1300 [Urine:700; Emesis/NG output:100; Stool:500] Intake/Output this shift:    General appearance: alert, cooperative and remains confused. GI: soft, but still distended not much in the way of bowel sounds.  Lab Results:   Recent Labs  09/25/14 0436 09/26/14 0352  WBC 7.0 5.8  HGB 12.5* 11.6*  HCT 38.3* 35.4*  PLT 298 256    BMET  Recent Labs  09/25/14 0436 09/26/14 0352  NA 135 139  K 3.3* 2.9*  CL 102 106  CO2 30 26  GLUCOSE 127* 88  BUN 35* 32*  CREATININE 1.27 1.11  CALCIUM 10.2 10.3   PT/INR No results for input(s): LABPROT, INR in the last 72 hours.   Recent Labs Lab 09/23/14 1639 09/24/14 0829  AST 31 20  ALT 17 18  ALKPHOS 52 50  BILITOT 1.1 0.9  PROT 6.1 5.3*  ALBUMIN 3.5 3.2*     Lipase     Component Value Date/Time   LIPASE 18 09/23/2014 1639     Studies/Results: Dg Abd 1 View  09/26/2014   CLINICAL DATA:  Nasogastric tube placement  EXAM: ABDOMEN - 1 VIEW  COMPARISON:  09/25/2014 at 10:30 a.m.  FINDINGS: Nasogastric tube tip overlaps the proximal stomach, with side port at the GE junction.  Ongoing small bowel obstruction with dilated  loops still seen in the central abdomen. Chronic elevation of the right diaphragm.  IMPRESSION: 1. Nasogastric tube tip at the proximal stomach, with side port at the GE junction. Advancement by 5 cm would secure positioning. 2. Ongoing small bowel obstruction.   Electronically Signed   By: Monte Fantasia M.D.   On: 09/26/2014 00:20   Dg Abd Portable 1v  09/25/2014   CLINICAL DATA:  Followup small bowel obstruction. Prostate carcinoma.  EXAM: PORTABLE ABDOMEN - 1 VIEW  COMPARISON:  09/24/2014  FINDINGS: Orogastric tube tip is again seen overlying the stomach. Multiple dilated small bowel loops are again seen within the central abdomen. Scattered bowel gas also seen throughout colon which is nondilated. This is not significantly changed and remains suspicious for partial small bowel obstruction. Surgical clips again seen within the upper abdomen and lower pelvis.  IMPRESSION: No significant change in degree of small bowel dilatation, suspicious for partial small bowel obstruction.   Electronically Signed   By: Earle Gell M.D.   On: 09/25/2014 12:38    Medications: . antiseptic oral rinse  7 mL Mouth Rinse q12n4p  . chlorhexidine  15 mL Mouth Rinse BID  . heparin subcutaneous  5,000 Units Subcutaneous 3 times per day  . potassium chloride  10 mEq Intravenous Q1 Hr  x 4    Assessment/Plan SBO, hx of colon resection, prostatectomy, and cholecystectomy. Hx of thoracotomy for thymoma Hx of prostate cancer Hx of stroke/dementia - currently very acute. Hx of hyper parathyroidism No antibiotics DVT: Heparin/SCD's. Hypokalemia  KCL being replaced  Plan:  i don't see any stool cultures, he isn't better, WBC is normal.  I will talk with Dr. Redmond Pulling.    LOS: 3 days    Jose Crawford 09/26/2014

## 2014-09-27 ENCOUNTER — Inpatient Hospital Stay (HOSPITAL_COMMUNITY): Payer: Medicare Other

## 2014-09-27 LAB — CBC
HCT: 35.5 % — ABNORMAL LOW (ref 39.0–52.0)
Hemoglobin: 11.8 g/dL — ABNORMAL LOW (ref 13.0–17.0)
MCH: 32.2 pg (ref 26.0–34.0)
MCHC: 33.2 g/dL (ref 30.0–36.0)
MCV: 96.7 fL (ref 78.0–100.0)
PLATELETS: 286 10*3/uL (ref 150–400)
RBC: 3.67 MIL/uL — ABNORMAL LOW (ref 4.22–5.81)
RDW: 13.2 % (ref 11.5–15.5)
WBC: 5.9 10*3/uL (ref 4.0–10.5)

## 2014-09-27 LAB — BASIC METABOLIC PANEL
ANION GAP: 7 (ref 5–15)
BUN: 31 mg/dL — ABNORMAL HIGH (ref 6–23)
CO2: 24 mmol/L (ref 19–32)
Calcium: 10.3 mg/dL (ref 8.4–10.5)
Chloride: 108 mmol/L (ref 96–112)
Creatinine, Ser: 1.13 mg/dL (ref 0.50–1.35)
GFR calc Af Amer: 65 mL/min — ABNORMAL LOW (ref >90)
GFR calc non Af Amer: 56 mL/min — ABNORMAL LOW (ref >90)
GLUCOSE: 86 mg/dL (ref 70–99)
POTASSIUM: 3.1 mmol/L — AB (ref 3.5–5.1)
SODIUM: 139 mmol/L (ref 135–145)

## 2014-09-27 LAB — MAGNESIUM: MAGNESIUM: 1.7 mg/dL (ref 1.5–2.5)

## 2014-09-27 MED ORDER — POTASSIUM CHLORIDE 10 MEQ/100ML IV SOLN
10.0000 meq | INTRAVENOUS | Status: AC
  Administered 2014-09-27 (×5): 10 meq via INTRAVENOUS
  Filled 2014-09-27 (×5): qty 100

## 2014-09-27 MED ORDER — DIATRIZOATE MEGLUMINE & SODIUM 66-10 % PO SOLN
90.0000 mL | Freq: Once | ORAL | Status: AC
  Administered 2014-09-27: 90 mL via ORAL

## 2014-09-27 NOTE — Progress Notes (Signed)
PT Cancellation Note  Patient Details Name: Jose Crawford MRN: [id number] DOB: September 02, 1925   Cancelled Treatment:     PT session withheld due to ABD CT planned for later with contrast.  RN did not want to risk pt getting nausea with activity.  Will check back another day as schedule permits.   [name] 09/27/2014, 1:07 PM

## 2014-09-27 NOTE — Progress Notes (Signed)
Progress Note   Jose Crawford FFM:[id number] DOB: 1925-11-14 DOA: 09/23/2014 PCP: [place]   Brief Narrative:   Jose Crawford is an 81 y.o. male with a PMH of dementia, thymoma status post left thoracotomy, prostate cancer in remission status post prostatectomy, history of laparoscopic cholecystectomy who was admitted 09/23/14 with chief complaint of abdominal pain, nausea and vomiting. CT scan done on admission showed a small bowel obstruction with a transition point. Surgery is following.  Assessment/Plan:   Principal Problem:   SBO (small bowel obstruction) - Surgery following. Continue NPO.   - Continue IVF with NS at 75 mL/hr. -Patient complains of ongoing abdominal discomfort and has ongoing loose stool.  Case was discussed with surgery who recommended obtaining a gastrograffin study. If there is contrast in colon would likely discontinue NG tube.   Active Problems:  Hypokalemia -Likely secondary to GI loss -Potassium 3.1 on am labs after the administration of IV yesterday -Will give 5 runs of IV potassium today, repeat labs in am    Dementia - Patient with history of advanced dementia, becoming agitated during this hospitalization    Hypernatremia - Likely from dehydration. Improved with IV fluids - Na=139 on 09/27/2014  Diarrhea -On 09/24/2014 stool for C. diff was negative -Pending GI panel    DVT Prophylaxis - Continue SCDs.  Code Status: Full. Family Communication: No family at the bedside.   Luanna Salk [phone]), daughter called updated on patient's condition Disposition Plan: May need SNF, lives with daugther.  PT evaluation requested.   IV Access:    Peripheral IV   Procedures and diagnostic studies:   Ct Abdomen Pelvis W Contrast 09/23/2014: Distal small bowel obstruction with focal transition point at the level of the distal terminal ileum but no mass identified. This could indicate adhesion or stricture. A  mass could be obscured.  Pelvic free fluid noted without free air identified.    Dg Abd Acute W/chest 09/23/2014: 1. Small bowel obstruction pattern identified. No free intraperitoneal air are identified.     Dg Abd Portable 1v 09/23/2014: Nasogastric tube tip at the distal esophagus. Advancement by 13 cm required for the side port to reach the stomach.     Medical Consultants:    Jackolyn Confer, MD, Surgery  Anti-Infectives:    None.  Subjective:    Jose Crawford appears less agitated, reports ongoing abdominal pain  Objective:    Filed Vitals:   09/26/14 1441 09/26/14 2131 09/27/14 0603 09/27/14 1008  BP: 141/67 146/60 169/79   Pulse: 77 76 70   Temp: 97.4 F (36.3 C) 98.6 F (37 C) 98 F (36.7 C)   TempSrc: Oral Oral Oral   Resp: 16 16 16    Height:      Weight:    80.6 kg (177 lb 11.1 oz)  SpO2: 96% 93% 94%     Intake/Output Summary (Last 24 hours) at 09/27/14 1152 Last data filed at 09/27/14 1008  Gross per 24 hour  Intake 1597.5 ml  Output    950 ml  Net  647.5 ml    Exam: Gen:  Groaning out, safety mittens on Cardiovascular:  RRR, No M/R/G Respiratory:  Lungs CTAB Gastrointestinal:  Abdomen soft, distended+ BS Extremities:  No C/E/C   Data Reviewed:    Labs: Basic Metabolic Panel:  Recent Labs Lab 09/24/14 0829 09/24/14 1140 09/25/14 0436 09/26/14 0352 09/27/14 0350  NA 136 136 135 139 139  K 3.2* 3.4* 3.3* 2.9*  3.1*  CL 104 104 102 106 108  CO2 27 26 30 26 24   GLUCOSE 120* 128* 127* 88 86  BUN 41* 42* 35* 32* 31*  CREATININE 1.14 1.25 1.27 1.11 1.13  CALCIUM 10.5 10.5 10.2 10.3 10.3  MG  --   --   --   --  1.7   GFR Estimated Creatinine Clearance: 50.6 mL/min (by C-G formula based on Cr of 1.13). Liver Function Tests:  Recent Labs Lab 09/23/14 1639 09/24/14 0829  AST 31 20  ALT 17 18  ALKPHOS 52 50  BILITOT 1.1 0.9  PROT 6.1 5.3*  ALBUMIN 3.5 3.2*    Recent Labs Lab 09/23/14 1639  LIPASE 18   CBC:  Recent  Labs Lab 09/23/14 1639 09/24/14 0829 09/25/14 0436 09/26/14 0352 09/27/14 0350  WBC 13.0* 10.3 7.0 5.8 5.9  NEUTROABS 10.9* 8.3*  --   --   --   HGB 14.2 12.0* 12.5* 11.6* 11.8*  HCT 43.3 36.6* 38.3* 35.4* 35.5*  MCV 96.4 96.8 95.8 95.7 96.7  PLT 301 289 298 256 286   Cardiac Enzymes:  Recent Labs Lab 09/23/14 1639  TROPONINI <0.03   CBG:  Recent Labs Lab 09/24/14 0041 09/24/14 0627 09/24/14 1807 09/25/14 0019 09/26/14 0533  GLUCAP 106* 118* 104* 99 75   Sepsis Labs:  Recent Labs Lab 09/23/14 1659 09/24/14 0829 09/25/14 0436 09/26/14 0352 09/27/14 0350  WBC  --  10.3 7.0 5.8 5.9  LATICACIDVEN 1.18  --   --   --   --    Microbiology Recent Results (from the past 240 hour(s))  Clostridium Difficile by PCR     Status: None   Collection Time: 09/24/14 10:56 AM  Result Value Ref Range Status   C difficile by pcr NEGATIVE NEGATIVE Final     Medications:   . antiseptic oral rinse  7 mL Mouth Rinse q12n4p  . chlorhexidine  15 mL Mouth Rinse BID  . heparin subcutaneous  5,000 Units Subcutaneous 3 times per day  . potassium chloride  10 mEq Intravenous Q1 Hr x 5   Continuous Infusions: . sodium chloride 75 mL/hr at 09/26/14 2216    Time spent: 20 minutes.   LOS: 4 days   Kelvin Cellar  [facility] Pager [phone]. If unable to reach me by pager, please call my cell phone at [phone].  *Please refer to [email or web address], password [email or web address] to get updated schedule on who will round on this patient, as hospitalists switch teams weekly. If 7PM-7AM, please contact night-coverage at [email or web address], password [email or web address] for any overnight needs.  09/27/2014, 11:52 AM

## 2014-09-27 NOTE — Progress Notes (Signed)
CSW received referral for New SNF.   CSW reviewed chart and noted that pt currently has NG tube due to SBO. Per attending MD note, surgery recommended obtaining a gastrograffin study. If there is contrast in colon would likely discontinue NG tube. NG tube is currently a barrier to arranging SNF placement.   CSW to follow pt progress and complete psychosocial assessment when appropriate.  Alison Murray, MSW, [place] [facility] [phone]

## 2014-09-27 NOTE — Progress Notes (Signed)
Subjective: Asks what's going on. Still c/o some abd discomfort but states it is much better than when he came in. He is still having fairly significant loose stool with leaking around flexiseal. Night nurse reconnected NG tube to suction last night bc pt c/o worsening nausea.   Objective: Vital signs in last 24 hours: Temp:  [97.4 F (36.3 C)-98.6 F (37 C)] 98 F (36.7 C) (03/04 0603) Pulse Rate:  [70-77] 70 (03/04 0603) Resp:  [16] 16 (03/04 0603) BP: (141-169)/(60-79) 169/79 mmHg (03/04 0603) SpO2:  [93 %-96 %] 94 % (03/04 0603) Last BM Date: 09/26/14  Intake/Output from previous day: 03/03 0701 - 03/04 0700 In: 1597.5 [P.O.:210; I.V.:1357.5; NG/GT:30] Out: 850 [Urine:450; Emesis/NG output:200; RXVQM:086] Intake/Output this shift:    More awake and alert, Soft, some distension, non tender  Lab Results:   Recent Labs  09/26/14 0352 09/27/14 0350  WBC 5.8 5.9  HGB 11.6* 11.8*  HCT 35.4* 35.5*  PLT 256 286   BMET  Recent Labs  09/26/14 0352 09/27/14 0350  NA 139 139  K 2.9* 3.1*  CL 106 108  CO2 26 24  GLUCOSE 88 86  BUN 32* 31*  CREATININE 1.11 1.13  CALCIUM 10.3 10.3   PT/INR No results for input(s): LABPROT, INR in the last 72 hours. ABG No results for input(s): PHART, HCO3 in the last 72 hours.  Invalid input(s): PCO2, PO2  Studies/Results: Dg Abd 1 View  09/26/2014   CLINICAL DATA:  Nasogastric tube placement  EXAM: ABDOMEN - 1 VIEW  COMPARISON:  09/25/2014 at 10:30 a.m.  FINDINGS: Nasogastric tube tip overlaps the proximal stomach, with side port at the GE junction.  Ongoing small bowel obstruction with dilated loops still seen in the central abdomen. Chronic elevation of the right diaphragm.  IMPRESSION: 1. Nasogastric tube tip at the proximal stomach, with side port at the GE junction. Advancement by 5 cm would secure positioning. 2. Ongoing small bowel obstruction.   Electronically Signed   By: Monte Fantasia M.D.   On: 09/26/2014 00:20   Dg  Abd Portable 1v  09/25/2014   CLINICAL DATA:  Followup small bowel obstruction. Prostate carcinoma.  EXAM: PORTABLE ABDOMEN - 1 VIEW  COMPARISON:  09/24/2014  FINDINGS: Orogastric tube tip is again seen overlying the stomach. Multiple dilated small bowel loops are again seen within the central abdomen. Scattered bowel gas also seen throughout colon which is nondilated. This is not significantly changed and remains suspicious for partial small bowel obstruction. Surgical clips again seen within the upper abdomen and lower pelvis.  IMPRESSION: No significant change in degree of small bowel dilatation, suspicious for partial small bowel obstruction.   Electronically Signed   By: Earle Gell M.D.   On: 09/25/2014 12:38    Anti-infectives: Anti-infectives    None      Assessment/Plan: Diarrhea Abdominal distension Ileus Electrolyte abnormalities.   He is clinically not obstructed since having large volume diarrhea but is SB is still dilated. On admission CT he did have indications of a transition point in distal ileum. He has no fever or wbc.   -GI pathogen PCR pending -will order SBO protocol films (90cc gastrograffin by mouth, clamp NG x 1hr, then reconnect to LIWS, KUB 8 hrs later. If contrast in colon pull NG; if contrast not in colon, repeat KUB in 24hrs) -keep K>4, Mg >2 -PT/OT  Leighton Ruff. Redmond Pulling, MD, FACS General, Bariatric, & Minimally Invasive Surgery [facility]   LOS: 4 days  Gayland Curry 09/27/2014

## 2014-09-27 NOTE — Progress Notes (Signed)
Pt with increased abdominal pain this AM, this writer hooked pt's NG tube back up to low intermittent suction with small amount of return. This RN to continue to monitor. Noreene Larsson RN, BSN

## 2014-09-27 NOTE — Progress Notes (Signed)
Patient tolerated his NG tube being clamped all during day shift

## 2014-09-28 LAB — GI PATHOGEN PANEL BY PCR, STOOL
C DIFFICILE TOXIN A/B: NOT DETECTED
Campylobacter by PCR: NOT DETECTED
Cryptosporidium by PCR: NOT DETECTED
E coli (ETEC) LT/ST: NOT DETECTED
E coli (STEC): NOT DETECTED
E coli 0157 by PCR: NOT DETECTED
G lamblia by PCR: NOT DETECTED
Norovirus GI/GII: NOT DETECTED
Rotavirus A by PCR: NOT DETECTED
Salmonella by PCR: NOT DETECTED
Shigella by PCR: NOT DETECTED

## 2014-09-28 LAB — BASIC METABOLIC PANEL
Anion gap: 5 (ref 5–15)
BUN: 26 mg/dL — ABNORMAL HIGH (ref 6–23)
CHLORIDE: 111 mmol/L (ref 96–112)
CO2: 23 mmol/L (ref 19–32)
Calcium: 10.3 mg/dL (ref 8.4–10.5)
Creatinine, Ser: 0.98 mg/dL (ref 0.50–1.35)
GFR calc Af Amer: 83 mL/min — ABNORMAL LOW (ref >90)
GFR calc non Af Amer: 72 mL/min — ABNORMAL LOW (ref >90)
GLUCOSE: 90 mg/dL (ref 70–99)
POTASSIUM: 3.3 mmol/L — AB (ref 3.5–5.1)
SODIUM: 139 mmol/L (ref 135–145)

## 2014-09-28 MED ORDER — POTASSIUM CHLORIDE CRYS ER 20 MEQ PO TBCR
40.0000 meq | EXTENDED_RELEASE_TABLET | Freq: Four times a day (QID) | ORAL | Status: AC
  Administered 2014-09-28 (×2): 40 meq via ORAL
  Filled 2014-09-28 (×2): qty 2

## 2014-09-28 NOTE — Clinical Social Work Psychosocial (Signed)
Clinical Social Work Department BRIEF PSYCHOSOCIAL ASSESSMENT 09/28/2014  Patient:  Jose Crawford, Jose Crawford     Account Number:  [id number]     Admit date:  09/23/2014  Clinical Social Worker:  Dede Query, CLINICAL SOCIAL WORKER  Date/Time:  09/28/2014 02:02 PM  Referred by:  Physician  Date Referred:  09/28/2014  Other Referral:   Interview type:  Family Other interview type:   Daughter Tina    PSYCHOSOCIAL DATA Living Status:  FAMILY Admitted from facility:   Level of care:   Primary support name:  Otila Kluver Primary support relationship to patient:  CHILD, ADULT Degree of support available:   High. Pt lives with daughter who has a business on same property her home    CURRENT CONCERNS  Other Concerns:    SOCIAL WORK ASSESSMENT / PLAN CSW reviewed chart which reflected pt was disoriented. CSW called and spoke with pt's daughter to discuss needs for services.  CSW introduced herself and explained role of CSW.  CSW prompted pt's daughter to discuss pt history and current needs.  CSW provided an explanation about SNF facilities and discussed sending pt information to facilities in pt's county.  CSW provided active and supportive listening.  CSW agreed to send pt information to SNF's in [place]   Assessment/plan status:  Psychosocial Support/Ongoing Assessment of Needs Other assessment/ plan:   Information/referral to community resources:   Send pt information to SNF's in [place] [employer] area    PATIENT'S/FAMILY'S RESPONSE TO PLAN OF CARE: Pt's daughter discussed pt's history stating that pt has been living with her for past 7 years since her mother passed away.  Pt's daughter stated that pt had been able to care for himself with ADL's but that she cooked for him. Pt's daughter stated that pt had been to [facility] in the past and they were very happy with that facility.  Pt's daughter stated that "it is hard" with pt being so big for her to care for him at this time.  Pt's daughter  very grateful for the help with locating a facility for pt's rehab needs.     Dede Query, LCSW [place] Worker - Weekend Coverage cell #: [phone]

## 2014-09-28 NOTE — Progress Notes (Signed)
Progress Note   [name]:[id number] DOB: 1926/05/28 DOA: 09/23/2014 PCP: [facility]   Brief Narrative:   Jose Crawford is an 81 y.o. male with a PMH of dementia, thymoma status post left thoracotomy, prostate cancer in remission status post prostatectomy, history of laparoscopic cholecystectomy who was admitted 09/23/14 with chief complaint of abdominal pain, nausea and vomiting. CT scan done on admission showed a small bowel obstruction with a transition point. Surgery is following.  Assessment/Plan:   Principal Problem:   SBO (small bowel obstruction) - Patient showing gradual clinical improvement, his NG tube was discontinued today by surgery as gastrograffin study showed contrast in colon.  -He appears to be tolerating sips of clears -Hopefully will continue to improve.  Active Problems:  Hypokalemia -Likely secondary to GI loss -Potassium 3.3 today after getting 5 runs of potassium on 09/27/2014 -Will give Kdur, repeat labs in am    Dementia - Patient with history of advanced dementia -Plan for SNF placement when stable    Hypernatremia - Likely from dehydration. Improved with IV fluids - Na=139 on 09/27/2014  Diarrhea -On 09/24/2014 stool for C. diff was negative -Pending GI panel    DVT Prophylaxis - Continue SCDs.  Code Status: Full. Family Communication: No family at the bedside.   Luanna Salk [phone]), daughter called updated on patient's condition Disposition Plan: May need SNF, lives with daugther.  PT evaluation requested.   IV Access:    Peripheral IV   Procedures and diagnostic studies:   Ct Abdomen Pelvis W Contrast 09/23/2014: Distal small bowel obstruction with focal transition point at the level of the distal terminal ileum but no mass identified. This could indicate adhesion or stricture. A mass could be obscured.  Pelvic free fluid noted without free air identified.    Dg Abd Acute W/chest  09/23/2014: 1. Small bowel obstruction pattern identified. No free intraperitoneal air are identified.     Dg Abd Portable 1v 09/23/2014: Nasogastric tube tip at the distal esophagus. Advancement by 13 cm required for the side port to reach the stomach.     Medical Consultants:    Jackolyn Confer, MD, Surgery  Anti-Infectives:    None.  Subjective:    Jose Crawford is pleasant, cooperative, in no acute distress. Looks better  Objective:    Filed Vitals:   09/27/14 1342 09/27/14 2053 09/28/14 0544 09/28/14 1347  BP: 146/74 146/80 143/70 136/52  Pulse: 99 79 67 58  Temp: 98.2 F (36.8 C) 98.7 F (37.1 C) 98 F (36.7 C) 97.6 F (36.4 C)  TempSrc: Oral Oral Oral Oral  Resp: 16 16 16 16   Height:      Weight:   85.2 kg (187 lb 13.3 oz)   SpO2: 96% 95% 97% 98%    Intake/Output Summary (Last 24 hours) at 09/28/14 1529 Last data filed at 09/28/14 1525  Gross per 24 hour  Intake 2216.3 ml  Output   1900 ml  Net  316.3 ml    Exam: Gen: Awake and alert, no acute distress, pleasantly confused.  Cardiovascular:  RRR, No M/R/G Respiratory:  Lungs CTAB Gastrointestinal:  Abdomen soft, improvement to distention, NG tube has been removed Extremities:  No C/E/C   Data Reviewed:    Labs: Basic Metabolic Panel:  Recent Labs Lab 09/24/14 1140 09/25/14 0436 09/26/14 0352 09/27/14 0350 09/28/14 0800  NA 136 135 139 139 139  K 3.4* 3.3* 2.9* 3.1* 3.3*  CL 104 102 106  108 111  CO2 26 30 26 24 23   GLUCOSE 128* 127* 88 86 90  BUN 42* 35* 32* 31* 26*  CREATININE 1.25 1.27 1.11 1.13 0.98  CALCIUM 10.5 10.2 10.3 10.3 10.3  MG  --   --   --  1.7  --    GFR Estimated Creatinine Clearance: 58.3 mL/min (by C-G formula based on Cr of 0.98). Liver Function Tests:  Recent Labs Lab 09/23/14 1639 09/24/14 0829  AST 31 20  ALT 17 18  ALKPHOS 52 50  BILITOT 1.1 0.9  PROT 6.1 5.3*  ALBUMIN 3.5 3.2*    Recent Labs Lab 09/23/14 1639  LIPASE 18   CBC:  Recent  Labs Lab 09/23/14 1639 09/24/14 0829 09/25/14 0436 09/26/14 0352 09/27/14 0350  WBC 13.0* 10.3 7.0 5.8 5.9  NEUTROABS 10.9* 8.3*  --   --   --   HGB 14.2 12.0* 12.5* 11.6* 11.8*  HCT 43.3 36.6* 38.3* 35.4* 35.5*  MCV 96.4 96.8 95.8 95.7 96.7  PLT 301 289 298 256 286   Cardiac Enzymes:  Recent Labs Lab 09/23/14 1639  TROPONINI <0.03   CBG:  Recent Labs Lab 09/24/14 0041 09/24/14 0627 09/24/14 1807 09/25/14 0019 09/26/14 0533  GLUCAP 106* 118* 104* 99 75   Sepsis Labs:  Recent Labs Lab 09/23/14 1659 09/24/14 0829 09/25/14 0436 09/26/14 0352 09/27/14 0350  WBC  --  10.3 7.0 5.8 5.9  LATICACIDVEN 1.18  --   --   --   --    Microbiology Recent Results (from the past 240 hour(s))  Clostridium Difficile by PCR     Status: None   Collection Time: 09/24/14 10:56 AM  Result Value Ref Range Status   C difficile by pcr NEGATIVE NEGATIVE Final     Medications:   . antiseptic oral rinse  7 mL Mouth Rinse q12n4p  . chlorhexidine  15 mL Mouth Rinse BID  . heparin subcutaneous  5,000 Units Subcutaneous 3 times per day  . potassium chloride  40 mEq Oral Q6H   Continuous Infusions: . sodium chloride 75 mL/hr at 09/28/14 0516    Time spent: 25 minutes.   LOS: 5 days   Kelvin Cellar  [facility] Pager [phone]. If unable to reach me by pager, please call my cell phone at [phone].  *Please refer to [email or web address], password [email or web address] to get updated schedule on who will round on this patient, as hospitalists switch teams weekly. If 7PM-7AM, please contact night-coverage at [email or web address], password [email or web address] for any overnight needs.  09/28/2014, 3:29 PM

## 2014-09-28 NOTE — Progress Notes (Signed)
General Surgery Note  LOS: 5 days  POD -     Assessment/Plan: 1.  Probably ileus  KUB 09/27/2014  shows contrast in colon  Tolerated NGT clamped overnight - will take out  Will start sips from floor  2.  Hx of thoracotomy for thymoma 3.  Hx of prostate cancer 4.  Hx of stroke/dementia - currently very acute.   5.  DVT prophylaxis - SQ Heparin 6.  Activity  PT ordered.  Needs to get out of bed to chair and walk if possible.   Principal Problem:   SBO (small bowel obstruction) Active Problems:   Dementia   Hypocalcemia   Hypernatremia   Subjective:  Complains about NGT.  No nauseated. Objective:   Filed Vitals:   09/28/14 0544  BP: 143/70  Pulse: 67  Temp: 98 F (36.7 C)  Resp: 16     Intake/Output from previous day:  03/04 0701 - 03/05 0700 In: 1081.3 [I.V.:1081.3] Out: 1150 [Urine:750; Stool:400]  Intake/Output this shift:      Physical Exam:   General: Older WM who is alert.   HEENT: Normal. Pupils equal. .   Lungs: Clear   Abdomen: Soft.  Has BS.   Lab Results:    Recent Labs  09/26/14 0352 09/27/14 0350  WBC 5.8 5.9  HGB 11.6* 11.8*  HCT 35.4* 35.5*  PLT 256 286    BMET   Recent Labs  09/27/14 0350 09/28/14 0800  NA 139 139  K 3.1* 3.3*  CL 108 111  CO2 24 23  GLUCOSE 86 90  BUN 31* 26*  CREATININE 1.13 0.98  CALCIUM 10.3 10.3    PT/INR  No results for input(s): LABPROT, INR in the last 72 hours.  ABG  No results for input(s): PHART, HCO3 in the last 72 hours.  Invalid input(s): PCO2, PO2   Studies/Results:  Dg Abd Portable 1v  09/27/2014   CLINICAL DATA:  Small bowel obstruction. Evaluate degree of obstruction.  EXAM: PORTABLE ABDOMEN - 1 VIEW  COMPARISON:  CT 09/23/2014.  Radiographs 09/25/2014.  FINDINGS: Supine views were obtained 8 hours after contrast administration per nasogastric tube per SBO protocol.  Contrast material is within the colon which is decompressed. The contrast extends to the rectum. There is persistent  moderate small bowel distension. Nasogastric tube is in place. No extraluminal contrast or free intraperitoneal air identified.  IMPRESSION: The contrast administered per nasogastric tube has passed into the colon which is decompressed. There is persistent small bowel distension.   Electronically Signed   By: Richardean Sale M.D.   On: 09/27/2014 19:08     Anti-infectives:   Anti-infectives    None      Alphonsa Overall, MD, FACS Pager: [phone] Surgery Office: [phone] 09/28/2014

## 2014-09-28 NOTE — Clinical Social Work Placement (Addendum)
Clinical Social Work Department CLINICAL SOCIAL WORK PLACEMENT NOTE 09/28/2014  Patient:  HENDRYX, RICKE  Account Number:  [id number] Admit date:  09/23/2014  Clinical Social Worker:  Dede Query, CLINICAL SOCIAL WORKER  Date/time:  09/28/2014 02:26 PM  Clinical Social Work is seeking post-discharge placement for this patient at the following level of care:   SKILLED NURSING   (*CSW will update this form in Epic as items are completed)   09/28/2014  Patient/family provided with [place] Department of Clinical Social Work's list of facilities offering this level of care within the geographic area requested by the patient (or if unable, by the patient's family).  09/28/2014  Patient/family informed of their freedom to choose among providers that offer the needed level of care, that participate in Medicare, Medicaid or managed care program needed by the patient, have an available bed and are willing to accept the patient.  09/28/2014  Patient/family informed of [facility]' ownership interest in [facility], as well as of the fact that they are under no obligation to receive care at this facility.  PASARR submitted to EDS on 09/28/2014 PASARR number received on 09/28/2014  FL2 transmitted to all facilities in geographic area requested by pt/family on  09/28/2014 FL2 transmitted to all facilities within larger geographic area on   Patient informed that his/her managed care company has contracts with or will negotiate with  certain facilities, including the following:     Patient/family informed of bed offers received:  10/01/2014 Patient chooses bed at [facility] and [facility] recommends and patient chooses bed at    Patient to be transferred to  on  [facility] on 10/03/14 Patient to be transferred to facility by ambulance [facility]) Patient and family notified of transfer on 10/03/2014  Name of family member notified:  Pt daughter, Otila Kluver notified  via telephone  The following physician request were entered in Epic:   Additional Comments:  .Dede Query, [place] Worker - Weekend Coverage cell #: [place], MSW, [place] Work [phone]

## 2014-09-29 LAB — CBC
HEMATOCRIT: 36.5 % — AB (ref 39.0–52.0)
Hemoglobin: 12 g/dL — ABNORMAL LOW (ref 13.0–17.0)
MCH: 31.8 pg (ref 26.0–34.0)
MCHC: 32.9 g/dL (ref 30.0–36.0)
MCV: 96.8 fL (ref 78.0–100.0)
Platelets: 286 10*3/uL (ref 150–400)
RBC: 3.77 MIL/uL — AB (ref 4.22–5.81)
RDW: 13.1 % (ref 11.5–15.5)
WBC: 7.5 10*3/uL (ref 4.0–10.5)

## 2014-09-29 LAB — BASIC METABOLIC PANEL
ANION GAP: 5 (ref 5–15)
BUN: 18 mg/dL (ref 6–23)
CHLORIDE: 110 mmol/L (ref 96–112)
CO2: 26 mmol/L (ref 19–32)
Calcium: 10.9 mg/dL — ABNORMAL HIGH (ref 8.4–10.5)
Creatinine, Ser: 1.01 mg/dL (ref 0.50–1.35)
GFR, EST AFRICAN AMERICAN: 75 mL/min — AB (ref >90)
GFR, EST NON AFRICAN AMERICAN: 65 mL/min — AB (ref >90)
Glucose, Bld: 103 mg/dL — ABNORMAL HIGH (ref 70–99)
Potassium: 4.3 mmol/L (ref 3.5–5.1)
Sodium: 141 mmol/L (ref 135–145)

## 2014-09-29 MED ORDER — OXYCODONE HCL 5 MG PO TABS
5.0000 mg | ORAL_TABLET | ORAL | Status: DC | PRN
  Administered 2014-09-29 – 2014-10-03 (×6): 5 mg via ORAL
  Filled 2014-09-29 (×6): qty 1

## 2014-09-29 NOTE — Progress Notes (Signed)
General Surgery Note  LOS: 6 days  POD -     Assessment/Plan: 1.  Probably ileus (vs SBO)  Tolerated sips from floor - will advance to clear liquid tray.  Still with loose stools.  2.  Hx of thoracotomy for thymoma 3.  Hx of prostate cancer 4.  Hx of stroke/dementia    5.  DVT prophylaxis - SQ Heparin 6.  Activity  PT ordered.  Needs to get out of bed to chair and walk if possible.  It does not appear that he got out of bed yesterday.  He still needs to get out of bed.    Principal Problem:   SBO (small bowel obstruction) Active Problems:   Dementia   Hypocalcemia   Hypernatremia   Subjective:  Tolerated sips from floor.  He just wants orange juice.  No abdominal complaints. Objective:   Filed Vitals:   09/28/14 2102  BP: 148/73  Pulse: 64  Temp: 98 F (36.7 C)  Resp: 16     Intake/Output from previous day:  03/05 0701 - 03/06 0700 In: 1135 [P.O.:480; I.V.:655] Out: 1900 [Urine:900; Stool:1000]  Intake/Output this shift:      Physical Exam:   General: Older WM who is alert.   HEENT: Normal. Pupils equal. .   Lungs: Clear   Abdomen: Soft.  Has BS.  No tenderness.   Lab Results:     Recent Labs  09/27/14 0350 09/29/14 0435  WBC 5.9 7.5  HGB 11.8* 12.0*  HCT 35.5* 36.5*  PLT 286 286    BMET    Recent Labs  09/28/14 0800 09/29/14 0435  NA 139 141  K 3.3* 4.3  CL 111 110  CO2 23 26  GLUCOSE 90 103*  BUN 26* 18  CREATININE 0.98 1.01  CALCIUM 10.3 10.9*    PT/INR  No results for input(s): LABPROT, INR in the last 72 hours.  ABG  No results for input(s): PHART, HCO3 in the last 72 hours.  Invalid input(s): PCO2, PO2   Studies/Results:  Dg Abd Portable 1v  09/27/2014   CLINICAL DATA:  Small bowel obstruction. Evaluate degree of obstruction.  EXAM: PORTABLE ABDOMEN - 1 VIEW  COMPARISON:  CT 09/23/2014.  Radiographs 09/25/2014.  FINDINGS: Supine views were obtained 8 hours after contrast administration per nasogastric tube per SBO protocol.   Contrast material is within the colon which is decompressed. The contrast extends to the rectum. There is persistent moderate small bowel distension. Nasogastric tube is in place. No extraluminal contrast or free intraperitoneal air identified.  IMPRESSION: The contrast administered per nasogastric tube has passed into the colon which is decompressed. There is persistent small bowel distension.   Electronically Signed   By: Richardean Sale M.D.   On: 09/27/2014 19:08     Anti-infectives:   Anti-infectives    None      Alphonsa Overall, MD, FACS Pager: [phone] Surgery Office: [phone] 09/29/2014

## 2014-09-29 NOTE — Progress Notes (Signed)
Progress Note   JANSEN SCIUTO RXV:[id number] DOB: [birth year] DOA: 09/23/2014 PCP: [facility]   Brief Narrative:   Jose Crawford is an 81 y.o. male with a PMH of dementia, thymoma status post left thoracotomy, prostate cancer in remission status post prostatectomy, history of laparoscopic cholecystectomy who was admitted 09/23/14 with chief complaint of abdominal pain, nausea and vomiting. CT scan done on admission showed a small bowel obstruction with a transition point. Surgery is following.  Assessment/Plan:   Principal Problem:   SBO (small bowel obstruction) - Patient showing gradual clinical improvement, his NG tube was discontinued today by surgery as gastrograffin study showed contrast in colon.  -Clinically improving, was advanced to Clear Liquid diet.  -Surgery Managing  Active Problems:  Hypokalemia -Likely secondary to GI loss -Potassium improved to 4.3 on am labs today    Dementia - Patient with history of advanced dementia -Plan for SNF placement when stable    Hypernatremia - Stable  Diarrhea -On 09/24/2014 stool for C. diff was negative -GI panel negative    DVT Prophylaxis - Continue SCDs.  Code Status: Full. Family Communication: Luanna Salk [phone]), daughter called updated on patient's condition Disposition Plan: May need SNF, lives with daugther.  PT evaluation requested.   IV Access:    Peripheral IV   Procedures and diagnostic studies:   Ct Abdomen Pelvis W Contrast 09/23/2014: Distal small bowel obstruction with focal transition point at the level of the distal terminal ileum but no mass identified. This could indicate adhesion or stricture. A mass could be obscured.  Pelvic free fluid noted without free air identified.    Dg Abd Acute W/chest 09/23/2014: 1. Small bowel obstruction pattern identified. No free intraperitoneal air are identified.     Dg Abd Portable 1v 09/23/2014: Nasogastric tube tip at  the distal esophagus. Advancement by 13 cm required for the side port to reach the stomach.     Medical Consultants:    Jackolyn Confer, MD, Surgery  Anti-Infectives:    None.  Subjective:    Jose Crawford is pleasant, cooperative, in no acute distress. Looks better. He states feeling better  Objective:    Filed Vitals:   09/27/14 2053 09/28/14 0544 09/28/14 1347 09/28/14 2102  BP: 146/80 143/70 136/52 148/73  Pulse: 79 67 58 64  Temp: 98.7 F (37.1 C) 98 F (36.7 C) 97.6 F (36.4 C) 98 F (36.7 C)  TempSrc: Oral Oral Oral Oral  Resp: 16 16 16 16   Height:      Weight:  85.2 kg (187 lb 13.3 oz)    SpO2: 95% 97% 98% 97%    Intake/Output Summary (Last 24 hours) at 09/29/14 1343 Last data filed at 09/28/14 1700  Gross per 24 hour  Intake    655 ml  Output    700 ml  Net    -45 ml    Exam: Gen: Awake and alert, no acute distress, pleasantly confused.  Cardiovascular:  RRR, No M/R/G Respiratory:  Lungs CTAB Gastrointestinal:  Abdomen soft, improvement to distention, NG tube has been removed Extremities:  No C/E/C   Data Reviewed:    Labs: Basic Metabolic Panel:  Recent Labs Lab 09/25/14 0436 09/26/14 0352 09/27/14 0350 09/28/14 0800 09/29/14 0435  NA 135 139 139 139 141  K 3.3* 2.9* 3.1* 3.3* 4.3  CL 102 106 108 111 110  CO2 30 26 24 23 26   GLUCOSE 127* 88 86 90 103*  BUN 35*  32* 31* 26* 18  CREATININE 1.27 1.11 1.13 0.98 1.01  CALCIUM 10.2 10.3 10.3 10.3 10.9*  MG  --   --  1.7  --   --    GFR Estimated Creatinine Clearance: 56.6 mL/min (by C-G formula based on Cr of 1.01). Liver Function Tests:  Recent Labs Lab 09/23/14 1639 09/24/14 0829  AST 31 20  ALT 17 18  ALKPHOS 52 50  BILITOT 1.1 0.9  PROT 6.1 5.3*  ALBUMIN 3.5 3.2*    Recent Labs Lab 09/23/14 1639  LIPASE 18   CBC:  Recent Labs Lab 09/23/14 1639 09/24/14 0829 09/25/14 0436 09/26/14 0352 09/27/14 0350 09/29/14 0435  WBC 13.0* 10.3 7.0 5.8 5.9 7.5   NEUTROABS 10.9* 8.3*  --   --   --   --   HGB 14.2 12.0* 12.5* 11.6* 11.8* 12.0*  HCT 43.3 36.6* 38.3* 35.4* 35.5* 36.5*  MCV 96.4 96.8 95.8 95.7 96.7 96.8  PLT 301 289 298 256 286 286   Cardiac Enzymes:  Recent Labs Lab 09/23/14 1639  TROPONINI <0.03   CBG:  Recent Labs Lab 09/24/14 0041 09/24/14 0627 09/24/14 1807 09/25/14 0019 09/26/14 0533  GLUCAP 106* 118* 104* 99 75   Sepsis Labs:  Recent Labs Lab 09/23/14 1659  09/25/14 0436 09/26/14 0352 09/27/14 0350 09/29/14 0435  WBC  --   < > 7.0 5.8 5.9 7.5  LATICACIDVEN 1.18  --   --   --   --   --   < > = values in this interval not displayed. Microbiology Recent Results (from the past 240 hour(s))  Clostridium Difficile by PCR     Status: None   Collection Time: 09/24/14 10:56 AM  Result Value Ref Range Status   C difficile by pcr NEGATIVE NEGATIVE Final     Medications:   . antiseptic oral rinse  7 mL Mouth Rinse q12n4p  . chlorhexidine  15 mL Mouth Rinse BID  . heparin subcutaneous  5,000 Units Subcutaneous 3 times per day   Continuous Infusions: . sodium chloride 75 mL/hr at 09/29/14 0619    Time spent: 25 minutes.   LOS: 6 days   Kelvin Cellar  [facility] Pager [phone]. If unable to reach me by pager, please call my cell phone at [phone].  *Please refer to [email or web address], password [email or web address] to get updated schedule on who will round on this patient, as hospitalists switch teams weekly. If 7PM-7AM, please contact night-coverage at [email or web address], password [email or web address] for any overnight needs.  09/29/2014, 1:43 PM

## 2014-09-30 ENCOUNTER — Inpatient Hospital Stay (HOSPITAL_COMMUNITY): Payer: Medicare Other

## 2014-09-30 LAB — BASIC METABOLIC PANEL
ANION GAP: 5 (ref 5–15)
BUN: 12 mg/dL (ref 6–23)
CHLORIDE: 106 mmol/L (ref 96–112)
CO2: 24 mmol/L (ref 19–32)
CREATININE: 0.92 mg/dL (ref 0.50–1.35)
Calcium: 10.4 mg/dL (ref 8.4–10.5)
GFR calc non Af Amer: 74 mL/min — ABNORMAL LOW (ref >90)
GFR, EST AFRICAN AMERICAN: 85 mL/min — AB (ref >90)
Glucose, Bld: 101 mg/dL — ABNORMAL HIGH (ref 70–99)
POTASSIUM: 3.5 mmol/L (ref 3.5–5.1)
Sodium: 135 mmol/L (ref 135–145)

## 2014-09-30 LAB — CBC
HCT: 36.8 % — ABNORMAL LOW (ref 39.0–52.0)
HEMOGLOBIN: 12.1 g/dL — AB (ref 13.0–17.0)
MCH: 31.4 pg (ref 26.0–34.0)
MCHC: 32.9 g/dL (ref 30.0–36.0)
MCV: 95.6 fL (ref 78.0–100.0)
PLATELETS: 273 10*3/uL (ref 150–400)
RBC: 3.85 MIL/uL — ABNORMAL LOW (ref 4.22–5.81)
RDW: 13.1 % (ref 11.5–15.5)
WBC: 11.3 10*3/uL — AB (ref 4.0–10.5)

## 2014-09-30 MED ORDER — POTASSIUM CHLORIDE CRYS ER 20 MEQ PO TBCR
40.0000 meq | EXTENDED_RELEASE_TABLET | Freq: Once | ORAL | Status: AC
  Administered 2014-09-30: 40 meq via ORAL
  Filled 2014-09-30: qty 2

## 2014-09-30 NOTE — Progress Notes (Signed)
Progress Note   Jose Crawford KZS:[id number] DOB: October 24, 1925 DOA: 09/23/2014 PCP: [place]   Brief Narrative:   Jose Crawford is an 81 y.o. male with a PMH of dementia, thymoma status post left thoracotomy, prostate cancer in remission status post prostatectomy, history of laparoscopic cholecystectomy who was admitted 09/23/14 with chief complaint of abdominal pain, nausea and vomiting. CT scan done on admission showed a small bowel obstruction with a transition point. Surgery is following.  Assessment/Plan:   Principal Problem:   SBO (small bowel obstruction) - Patient showing gradual clinical improvement, his NG tube was discontinued today by surgery as gastrograffin study showed contrast in colon.  -Clinically improving, was advanced to Clear Liquid diet which he seems to be tolerating. PT/OT have been consulted. -Repeat Abd film showing interval elimination of colonic contrast, persistent small bowel dilatation  -Surgery Managing  Active Problems:  Hypokalemia -Likely secondary to GI loss -Potassium at 3.5 will give Caroleen    Dementia - Patient with history of advanced dementia -Plan for SNF placement when stable    Hypernatremia - Stable  Diarrhea -On 09/24/2014 stool for C. diff was negative -GI panel negative    DVT Prophylaxis - Continue SCDs.  Code Status: Full. Family Communication: Luanna Salk [phone]), daughter called updated on patient's condition Disposition Plan: May need SNF, lives with daugther.  PT evaluation requested.   IV Access:    Peripheral IV   Procedures and diagnostic studies:   Ct Abdomen Pelvis W Contrast 09/23/2014: Distal small bowel obstruction with focal transition point at the level of the distal terminal ileum but no mass identified. This could indicate adhesion or stricture. A mass could be obscured.  Pelvic free fluid noted without free air identified.    Dg Abd Acute W/chest 09/23/2014:  1. Small bowel obstruction pattern identified. No free intraperitoneal air are identified.     Dg Abd Portable 1v 09/23/2014: Nasogastric tube tip at the distal esophagus. Advancement by 13 cm required for the side port to reach the stomach.     Medical Consultants:    Jackolyn Confer, MD, Surgery  Anti-Infectives:    None.  Subjective:    Jose Crawford is pleasant, cooperative, in no acute distress. Looks better. He states feeling better  Objective:    Filed Vitals:   09/29/14 1405 09/29/14 2030 09/30/14 0504 09/30/14 1427  BP: 113/60 149/69 133/62 122/69  Pulse: 71 60 65 79  Temp: 97.6 F (36.4 C) 98.4 F (36.9 C) 98.1 F (36.7 C) 98 F (36.7 C)  TempSrc: Oral Oral Oral Oral  Resp: 16 16 18 18   Height:      Weight:   85.1 kg (187 lb 9.8 oz)   SpO2: 98% 99% 96% 100%    Intake/Output Summary (Last 24 hours) at 09/30/14 1614 Last data filed at 09/30/14 1300  Gross per 24 hour  Intake    600 ml  Output   2900 ml  Net  -2300 ml    Exam: Gen: Awake and alert, no acute distress, pleasantly confused.  Cardiovascular:  RRR, No M/R/G Respiratory:  Lungs CTAB Gastrointestinal:  Abdomen soft, improvement to distention, NG tube has been removed Extremities:  No C/E/C   Data Reviewed:    Labs: Basic Metabolic Panel:  Recent Labs Lab 09/26/14 0352 09/27/14 0350 09/28/14 0800 09/29/14 0435 09/30/14 0452  NA 139 139 139 141 135  K 2.9* 3.1* 3.3* 4.3 3.5  CL 106 108 111 110  106  CO2 26 24 23 26 24   GLUCOSE 88 86 90 103* 101*  BUN 32* 31* 26* 18 12  CREATININE 1.11 1.13 0.98 1.01 0.92  CALCIUM 10.3 10.3 10.3 10.9* 10.4  MG  --  1.7  --   --   --    GFR Estimated Creatinine Clearance: 62.1 mL/min (by C-G formula based on Cr of 0.92). Liver Function Tests:  Recent Labs Lab 09/23/14 1639 09/24/14 0829  AST 31 20  ALT 17 18  ALKPHOS 52 50  BILITOT 1.1 0.9  PROT 6.1 5.3*  ALBUMIN 3.5 3.2*    Recent Labs Lab 09/23/14 1639  LIPASE 18    CBC:  Recent Labs Lab 09/23/14 1639 09/24/14 0829 09/25/14 0436 09/26/14 0352 09/27/14 0350 09/29/14 0435 09/30/14 0452  WBC 13.0* 10.3 7.0 5.8 5.9 7.5 11.3*  NEUTROABS 10.9* 8.3*  --   --   --   --   --   HGB 14.2 12.0* 12.5* 11.6* 11.8* 12.0* 12.1*  HCT 43.3 36.6* 38.3* 35.4* 35.5* 36.5* 36.8*  MCV 96.4 96.8 95.8 95.7 96.7 96.8 95.6  PLT 301 289 298 256 286 286 273   Cardiac Enzymes:  Recent Labs Lab 09/23/14 1639  TROPONINI <0.03   CBG:  Recent Labs Lab 09/24/14 0041 09/24/14 0627 09/24/14 1807 09/25/14 0019 09/26/14 0533  GLUCAP 106* 118* 104* 99 75   Sepsis Labs:  Recent Labs Lab 09/23/14 1659  09/26/14 0352 09/27/14 0350 09/29/14 0435 09/30/14 0452  WBC  --   < > 5.8 5.9 7.5 11.3*  LATICACIDVEN 1.18  --   --   --   --   --   < > = values in this interval not displayed. Microbiology Recent Results (from the past 240 hour(s))  Clostridium Difficile by PCR     Status: None   Collection Time: 09/24/14 10:56 AM  Result Value Ref Range Status   C difficile by pcr NEGATIVE NEGATIVE Final     Medications:   . antiseptic oral rinse  7 mL Mouth Rinse q12n4p  . chlorhexidine  15 mL Mouth Rinse BID  . heparin subcutaneous  5,000 Units Subcutaneous 3 times per day   Continuous Infusions: . sodium chloride 75 mL/hr at 09/30/14 0941    Time spent: 25 minutes.   LOS: 7 days   Kelvin Cellar  [facility] Pager [phone]. If unable to reach me by pager, please call my cell phone at [phone].  *Please refer to [email or web address], password [email or web address] to get updated schedule on who will round on this patient, as hospitalists switch teams weekly. If 7PM-7AM, please contact night-coverage at [email or web address], password [email or web address] for any overnight needs.  09/30/2014, 4:14 PM

## 2014-09-30 NOTE — Progress Notes (Signed)
NUTRITION BRIEF NOTE  Pt identified due to Low Braden  Wt Readings from Last 10 Encounters:  09/30/14 187 lb 9.8 oz (85.1 kg)  07/30/12 168 lb (76.204 kg)  02/21/12 168 lb (76.204 kg)  01/29/12 161 lb 2.5 oz (73.1 kg)  01/20/12 170 lb 13.7 oz (77.5 kg)  01/12/12 168 lb 3 oz (76.289 kg)  12/29/11 170 lb (77.111 kg)  12/24/11 168 lb (76.204 kg)  12/21/11 168 lb 11.2 oz (76.522 kg)  12/07/11 176 lb 1.6 oz (79.878 kg)   Body mass index is 25.44 kg/(m^2). Pt meet criteria for Overweight based on current BMI.  Current diet order is Clear Liquids due to SBO. Pt denied any nausea, vomiting or abdominal pain. His current PO intake is 100%.  Offered feeding supplement [employer], but pt did not want to try it. Will continue to monitor PO intake.  NFPE did not reveal any subcutaneous body fat or muscle mass depletion. Labs and medications reviewed.  Previous medical records indicate wt gain in past three months.   No nutrition interventions warranted at this time. If nutrition issues arise, please consult RD.   Wynona Dove, MS Dietetic Intern Pager: [phone]

## 2014-09-30 NOTE — Progress Notes (Deleted)
  Subjective: He is complaining of feeling bad and moaning, but he can't tell me where he hurts.  Abdomen is not really tender.  He has a foley and a rectal tube in place.  He has a clear tray he has finished and does not appear to be having any nausea with it currently.   Objective: Vital signs in last 24 hours: Temp:  [97.6 F (36.4 C)-98.4 F (36.9 C)] 98.1 F (36.7 C) (03/07 0504) Pulse Rate:  [60-71] 65 (03/07 0504) Resp:  [16-18] 18 (03/07 0504) BP: (113-149)/(60-69) 133/62 mmHg (03/07 0504) SpO2:  [96 %-99 %] 96 % (03/07 0504) Weight:  [187 lb 9.8 oz (85.1 kg)] 187 lb 9.8 oz (85.1 kg) (03/07 0504) Last BM Date: 09/29/14 600 PO 2600 from NG Labs show WBC is up K+ 3.5 Contrast for obstruction went thru to the colon in 8 hours Intake/Output from previous day: 03/06 0701 - 03/07 0700 In: 600 [P.O.:600] Out: 2600 [Urine:2600] Intake/Output this shift:    General appearance: alert, cooperative and moaning, sad,  says he feels bad, but cannot tell me where.   GI: soft, a little distended, not really tender to palpation, BS very hyperactive, having ongoing diarrhea.  Rectal tube in place..  Lab Results:   Recent Labs  09/29/14 0435 09/30/14 0452  WBC 7.5 11.3*  HGB 12.0* 12.1*  HCT 36.5* 36.8*  PLT 286 273    BMET  Recent Labs  09/29/14 0435 09/30/14 0452  NA 141 135  K 4.3 3.5  CL 110 106  CO2 26 24  GLUCOSE 103* 101*  BUN 18 12  CREATININE 1.01 0.92  CALCIUM 10.9* 10.4   PT/INR No results for input(s): LABPROT, INR in the last 72 hours.   Recent Labs Lab 09/23/14 1639 09/24/14 0829  AST 31 20  ALT 17 18  ALKPHOS 52 50  BILITOT 1.1 0.9  PROT 6.1 5.3*  ALBUMIN 3.5 3.2*     Lipase     Component Value Date/Time   LIPASE 18 09/23/2014 1639     Studies/Results: No results found.  Medications: . antiseptic oral rinse  7 mL Mouth Rinse q12n4p  . chlorhexidine  15 mL Mouth Rinse BID  . heparin subcutaneous  5,000 Units Subcutaneous 3  times per day    Assessment/Plan 1. Probably ileus (vs SBO) Prior surgeries include a lap  chole, and another surgical procedure, he has lower midline abdominal incision. Tolerated sips from floor - will advance to clear liquid tray. Still with loose stools. 2. Hx of thoracotomy for thymoma 3. Hx of prostate cancer 4. Hx of stroke/dementia  5. DVT prophylaxis - SQ Heparin/SCD    Plan:  I will recheck his film, keep him on clears for now and see how he does, I don't know how much stool he is having.  The C diff, and stool pathogens are negative.  I will recheck a film, last one was 09/27/14.  I would continue PT/OT.  If we can get him up and moving that might help.  I don't know what his baseline is prior to admit.    LOS: 7 days    Safi Culotta 09/30/2014

## 2014-09-30 NOTE — Progress Notes (Signed)
Subjective: He is complaining of feeling bad and moaning, but he can't tell me where he hurts. Abdomen is not really tender. He has a foley and a rectal tube in place. He has a clear tray he has finished and does not appear to be having any nausea with it currently.   Objective: Vital signs in last 24 hours: Temp: [97.6 F (36.4 C)-98.4 F (36.9 C)] 98.1 F (36.7 C) (03/07 0504) Pulse Rate: [60-71] 65 (03/07 0504) Resp: [16-18] 18 (03/07 0504) BP: (113-149)/(60-69) 133/62 mmHg (03/07 0504) SpO2: [96 %-99 %] 96 % (03/07 0504) Weight: [187 lb 9.8 oz (85.1 kg)] 187 lb 9.8 oz (85.1 kg) (03/07 0504) Last BM Date: 09/29/14 600 PO 2600 from NG Labs show WBC is up K+ 3.5 Contrast for obstruction went thru to the colon in 8 hours Intake/Output from previous day: 03/06 0701 - 03/07 0700 In: 600 [P.O.:600] Out: 2600 [Urine:2600] Intake/Output this shift:    General appearance: alert, cooperative and moaning, sad, says he feels bad, but cannot tell me where.  GI: soft, a little distended, not really tender to palpation, BS very hyperactive, having ongoing diarrhea. Rectal tube in place..  Lab Results:   Recent Labs (last 2 labs)      Recent Labs  09/29/14 0435 09/30/14 0452  WBC 7.5 11.3*  HGB 12.0* 12.1*  HCT 36.5* 36.8*  PLT 286 273      BMET  Recent Labs (last 2 labs)      Recent Labs  09/29/14 0435 09/30/14 0452  NA 141 135  K 4.3 3.5  CL 110 106  CO2 26 24  GLUCOSE 103* 101*  BUN 18 12  CREATININE 1.01 0.92  CALCIUM 10.9* 10.4     PT/INR  Recent Labs (last 2 labs)     No results for input(s): LABPROT, INR in the last 72 hours.     Last Labs      Recent Labs Lab 09/23/14 1639 09/24/14 0829  AST 31 20  ALT 17 18  ALKPHOS 52 50  BILITOT 1.1 0.9  PROT 6.1 5.3*  ALBUMIN 3.5 3.2*       Lipase   Labs (Brief)       Component Value Date/Time   LIPASE 18 09/23/2014  1639      Studies/Results:  Imaging Results (Last 48 hours)    No results found.    Medications: . antiseptic oral rinse 7 mL Mouth Rinse q12n4p  . chlorhexidine 15 mL Mouth Rinse BID  . heparin subcutaneous 5,000 Units Subcutaneous 3 times per day    Assessment/Plan 1. Probably ileus (vs SBO) Prior surgeries include a lap chole, and another surgical procedure, he has lower midline abdominal incision. Tolerated sips from floor - will advance to clear liquid tray. Still with loose stools. 2. Hx of thoracotomy for thymoma 3. Hx of prostate cancer 4. Hx of stroke/dementia  5. DVT prophylaxis - SQ Heparin/SCD   Plan: I will recheck his film, keep him on clears for now and see how he does, I don't know how much stool he is having. The C diff, and stool pathogens are negative. I will recheck a film, last one was 09/27/14. I would continue PT/OT. If we can get him up and moving that might help. I don't know what his baseline is prior to admit.  Consider GI consult.  Film shows:  Nasogastric tube is no longer visible and may have been withdrawn or the note. Surgical clips overlie the right upper quadrant  of the abdomen. Colonic contrast has largely passed from the gastrointestinal tract. Small amounts of contrast are identified within colonic loops. There is persistent mild dilatation of small bowel loops. No evidence for free intraperitoneal air or pneumatosis.     LOS: 7 days    Markham Dumlao 09/30/2014        Revision History

## 2014-10-01 DIAGNOSIS — R1013 Epigastric pain: Secondary | ICD-10-CM

## 2014-10-01 LAB — BASIC METABOLIC PANEL
Anion gap: 2 — ABNORMAL LOW (ref 5–15)
BUN: 11 mg/dL (ref 6–23)
CO2: 26 mmol/L (ref 19–32)
CREATININE: 1.02 mg/dL (ref 0.50–1.35)
Calcium: 10.9 mg/dL — ABNORMAL HIGH (ref 8.4–10.5)
Chloride: 108 mmol/L (ref 96–112)
GFR calc Af Amer: 74 mL/min — ABNORMAL LOW (ref >90)
GFR calc non Af Amer: 64 mL/min — ABNORMAL LOW (ref >90)
Glucose, Bld: 106 mg/dL — ABNORMAL HIGH (ref 70–99)
Potassium: 4.4 mmol/L (ref 3.5–5.1)
Sodium: 136 mmol/L (ref 135–145)

## 2014-10-01 NOTE — Progress Notes (Signed)
CSW continuing to follow.   Per MD, pt not yet medically ready for discharge.   CSW contacted pt daughter, Otila Kluver via telephone to discuss disposition planning.   Pt daughter aware of plan for rehab at SNF following hospitalization. Pt daughter stated that she was hopeful for [facility] once medically ready for discharge as pt has been to facility in the past. CSW discussed with pt daughter that [facility] is able to offer a bed for pt upon discharge. Pt daughter relieved that [facility] is an option and wants to accept bed offer.   CSW contacted [facility] and confirmed that facility could accept pt when medically stable for discharge.   CSW to continue to follow to provide support and assist with pt transition to [facility] when pt medically stable for discharge.  Alison Murray, MSW, [facility] [phone]

## 2014-10-01 NOTE — Progress Notes (Signed)
Progress Note   Jose Crawford [id number]:[id number] DOB: 02-19-1926 DOA: 09/23/2014 PCP: [facility]   Interim Summary:   Jose Crawford is an 81 y.o. male with a PMH of dementia, thymoma status post left thoracotomy, prostate cancer in remission status post prostatectomy, history of laparoscopic cholecystectomy who was admitted 09/23/14 with chief complaint of abdominal pain, nausea and vomiting. CT scan done on admission showed a small bowel obstruction with a transition point. Patient treated for small bowel obstruction/ileus during this hospitalization. He was treated with bowel rest, IV fluid resuscitation, NG tube placement. Symptoms improved after conservative management. He had a Gastrografin study done on 09/29/2014 which showed contrast material in the colon. At this point his NG tube was discontinued and diet was advanced. During this hospitalization he has had diarrhea with stool for C. difficile and stool pathogen panel BACK negative. Diarrhea markedly improved by 10/01/2014, rectal tube has been discontinued. Patient remains profoundly deconditioned, he was assisted out of bed to chair, was maximum assist. Physical therapy has been consulted. Plan to advance his diet today. Will require skilled nursing facility placement.   Assessment/Plan:   Principal Problem:   SBO/Ileus - Patient showing gradual clinical improvement -Clinically improving as he is tolerating clears. -Repeat Abd film showing interval elimination of colonic contrast, persistent small bowel dilatation  -Case discussed with Surgery, will advance his diet to fulls today   Active Problems:  Hypokalemia -Likely secondary to GI loss -Potassium improved to 4.4 after administering Kdur yesterday.     Dementia - Patient with history of advanced dementia -Plan for SNF placement when stable    Hypernatremia - Stable  Diarrhea -On 09/24/2014 stool for C. diff was negative -GI panel  negative -Per RN resolved. Rectal tube as been discontinued.     DVT Prophylaxis - Continue SCDs.  Code Status: Full. Family Communication: Luanna Salk [phone]), daughter called updated on patient's condition Disposition Plan: May need SNF, lives with daugther.  PT evaluation requested.   IV Access:    Peripheral IV   Procedures and diagnostic studies:   Ct Abdomen Pelvis W Contrast 09/23/2014: Distal small bowel obstruction with focal transition point at the level of the distal terminal ileum but no mass identified. This could indicate adhesion or stricture. A mass could be obscured.  Pelvic free fluid noted without free air identified.    Dg Abd Acute W/chest 09/23/2014: 1. Small bowel obstruction pattern identified. No free intraperitoneal air are identified.     Dg Abd Portable 1v 09/23/2014: Nasogastric tube tip at the distal esophagus. Advancement by 13 cm required for the side port to reach the stomach.     Medical Consultants:    Jackolyn Confer, MD, Surgery  Anti-Infectives:    None.  Subjective:    Jose Crawford is pleasant, cooperative, in no acute distress. Looks better. He states feeling better, was assisted out of bed to chair  Objective:    Filed Vitals:   09/30/14 0504 09/30/14 1427 09/30/14 2059 10/01/14 0531  BP: 133/62 122/69 139/79 123/64  Pulse: 65 79 66 72  Temp: 98.1 F (36.7 C) 98 F (36.7 C) 97.5 F (36.4 C) 98.8 F (37.1 C)  TempSrc: Oral Oral Oral Oral  Resp: 18 18 18 18   Height:      Weight: 85.1 kg (187 lb 9.8 oz)     SpO2: 96% 100% 96% 93%    Intake/Output Summary (Last 24 hours) at 10/01/14 1238 Last data filed  at 10/01/14 0830  Gross per 24 hour  Intake    840 ml  Output   4650 ml  Net  -3810 ml    Exam: Gen: Awake and alert, no acute distress, pleasantly confused.  Cardiovascular:  RRR, No M/R/G Respiratory:  Lungs CTAB Gastrointestinal:  Abdomen soft, improvement to distention, NG tube has been  removed Extremities:  No C/E/C   Data Reviewed:    Labs: Basic Metabolic Panel:  Recent Labs Lab 09/27/14 0350 09/28/14 0800 09/29/14 0435 09/30/14 0452 10/01/14 0444  NA 139 139 141 135 136  K 3.1* 3.3* 4.3 3.5 4.4  CL 108 111 110 106 108  CO2 24 23 26 24 26   GLUCOSE 86 90 103* 101* 106*  BUN 31* 26* 18 12 11   CREATININE 1.13 0.98 1.01 0.92 1.02  CALCIUM 10.3 10.3 10.9* 10.4 10.9*  MG 1.7  --   --   --   --    GFR Estimated Creatinine Clearance: 56 mL/min (by C-G formula based on Cr of 1.02). Liver Function Tests: No results for input(s): AST, ALT, ALKPHOS, BILITOT, PROT, ALBUMIN in the last 168 hours. No results for input(s): LIPASE, AMYLASE in the last 168 hours. CBC:  Recent Labs Lab 09/25/14 0436 09/26/14 0352 09/27/14 0350 09/29/14 0435 09/30/14 0452  WBC 7.0 5.8 5.9 7.5 11.3*  HGB 12.5* 11.6* 11.8* 12.0* 12.1*  HCT 38.3* 35.4* 35.5* 36.5* 36.8*  MCV 95.8 95.7 96.7 96.8 95.6  PLT 298 256 286 286 273   Cardiac Enzymes: No results for input(s): CKTOTAL, CKMB, CKMBINDEX, TROPONINI in the last 168 hours. CBG:  Recent Labs Lab 09/24/14 1807 09/25/14 0019 09/26/14 0533  GLUCAP 104* 99 75   Sepsis Labs:  Recent Labs Lab 09/26/14 0352 09/27/14 0350 09/29/14 0435 09/30/14 0452  WBC 5.8 5.9 7.5 11.3*   Microbiology Recent Results (from the past 240 hour(s))  Clostridium Difficile by PCR     Status: None   Collection Time: 09/24/14 10:56 AM  Result Value Ref Range Status   C difficile by pcr NEGATIVE NEGATIVE Final     Medications:   . antiseptic oral rinse  7 mL Mouth Rinse q12n4p  . heparin subcutaneous  5,000 Units Subcutaneous 3 times per day   Continuous Infusions: . sodium chloride 75 mL/hr at 09/30/14 2106    Time spent: 25 minutes.   LOS: 8 days   Kelvin Cellar  [facility] Pager [phone]. If unable to reach me by pager, please call my cell phone at [phone].  *Please refer to [email or web address], password [email or web address] to get  updated schedule on who will round on this patient, as hospitalists switch teams weekly. If 7PM-7AM, please contact night-coverage at [email or web address], password [email or web address] for any overnight needs.  10/01/2014, 12:38 PM

## 2014-10-01 NOTE — Progress Notes (Signed)
Physical Therapy Treatment Patient Details Name: Jose Crawford MRN: [id number] DOB: October 04, 1925 Today's Date: 10/01/2014    History of Present Illness 81 y.o. male with a PMH of dementia, thymoma status post left thoracotomy, prostate cancer in remission status post prostatectomy, history of laparoscopic cholecystectomy who was admitted 09/23/14 with chief complaint of abdominal pain, nausea and vomiting and found to have SBO    PT Comments    Pt in recliner on arrival.  Pt requiring increased assist at this time for mobility.  Recommended nursing staff use lift for OOB, as RN and NT in room for session.  Follow Up Recommendations  SNF;Supervision/Assistance - 24 hour     Equipment Recommendations  None recommended by PT    Recommendations for Other Services       Precautions / Restrictions Precautions Precautions: Fall    Mobility  Bed Mobility Overal bed mobility: Needs Assistance;+2 for physical assistance Bed Mobility: Sit to Supine       Sit to supine: Max assist;+2 for physical assistance   General bed mobility comments: assist pt with lowering trunk and lifting LEs onto bed  Transfers Overall transfer level: Needs assistance Equipment used: Rolling walker (2 wheeled) Transfers: Sit to/from [facility] Sit to Stand: Max assist;+2 physical assistance Stand pivot transfers: Max assist;+2 physical assistance       General transfer comment: increased assist to standing, max verbal and tactile cues for assisting with technique, NT assisted with holding RW for pt, nursing staff aware to use maxi move lift for OOB mobility (pt was already in recliner on arrival)  Ambulation/Gait                 Stairs            Wheelchair Mobility    Modified Rankin (Stroke Patients Only)       Balance           Standing balance support: Bilateral upper extremity supported Standing balance-Leahy Scale: Zero Standing balance comment: max  support with standing using RW                    Cognition Arousal/Alertness: Awake/alert Behavior During Therapy: WFL for tasks assessed/performed Overall Cognitive Status: No family/caregiver present to determine baseline cognitive functioning (hx dementia)                      Exercises      General Comments        Pertinent Vitals/Pain Pain Assessment: Faces Pain Score: 8  Pain Location: not stating Pain Descriptors / Indicators: Moaning;Grimacing Pain Intervention(s): Limited activity within patient's tolerance;Monitored during session;Repositioned    Home Living                      Prior Function            PT Goals (current goals can now be found in the care plan section) Acute Rehab PT Goals PT Goal Formulation: Patient unable to participate in goal setting Time For Goal Achievement: 10/08/14 Potential to Achieve Goals: Fair Progress towards PT goals: Progressing toward goals    Frequency  Min 3X/week    PT Plan Current plan remains appropriate    Co-evaluation             End of Session Equipment Utilized During Treatment: Gait belt Activity Tolerance: Patient limited by fatigue;Patient limited by pain Patient left: in bed;with call bell/phone within reach;with nursing/sitter in room  Time: 1440-1500 PT Time Calculation (min) (ACUTE ONLY): 20 min  Charges:  $Therapeutic Activity: 8-22 mins                    G Codes:      Deaundra Kutzer,KATHrine E 10-07-14, 3:57 PM Carmelia Bake, PT, DPT 10/07/2014 Pager: [phone]

## 2014-10-01 NOTE — Progress Notes (Signed)
Subjective:  It took 3 people to do it but he is out of bed.  1st time as far as I can tell.   He is anxious and just never looks comfortable or happy. Rectal tube is out and he is not having diarrhea now.   Objective: Vital signs in last 24 hours: Temp:  [97.5 F (36.4 C)-98.8 F (37.1 C)] 98.8 F (37.1 C) (03/08 0531) Pulse Rate:  [66-79] 72 (03/08 0531) Resp:  [18] 18 (03/08 0531) BP: (122-139)/(64-79) 123/64 mmHg (03/08 0531) SpO2:  [93 %-100 %] 93 % (03/08 0531) Last BM Date: 09/29/14 840 PO recorded, 4 liters of urine recorded.  He is listed as 7 liter negative.  Rectal tube is out, no further diarrhea reported.   Afebrile, VSS Labs OK, calcium is up Stool pathogen study was negative. Intake/Output from previous day: 03/07 0701 - 03/08 0700 In: 840 [P.O.:840] Out: 8182 [Urine:4050] Intake/Output this shift:    General appearance: alert and sad and a little tearful. GI: soft, but says it hurts when I palpate abdomen.  He just got out of bed for the 1st time and it took 3 people to do it., +BS,   Lab Results:   Recent Labs  09/29/14 0435 09/30/14 0452  WBC 7.5 11.3*  HGB 12.0* 12.1*  HCT 36.5* 36.8*  PLT 286 273    BMET  Recent Labs  09/30/14 0452 10/01/14 0444  NA 135 136  K 3.5 4.4  CL 106 108  CO2 24 26  GLUCOSE 101* 106*  BUN 12 11  CREATININE 0.92 1.02  CALCIUM 10.4 10.9*   PT/INR No results for input(s): LABPROT, INR in the last 72 hours.   Recent Labs Lab 09/24/14 0829  AST 20  ALT 18  ALKPHOS 50  BILITOT 0.9  PROT 5.3*  ALBUMIN 3.2*     Lipase     Component Value Date/Time   LIPASE 18 09/23/2014 1639     Studies/Results: Dg Abd Portable 1v  09/30/2014   CLINICAL DATA:  Complaining of feeling bad and moaning. Cannot express where the pain is. Nontender abdomen. Foley catheter and rectal tube.  EXAM: PORTABLE ABDOMEN - 1 VIEW  COMPARISON:  09/27/2014  FINDINGS: Nasogastric tube is no longer visible and may have been  withdrawn or the note. Surgical clips overlie the right upper quadrant of the abdomen. Colonic contrast has largely passed from the gastrointestinal tract. Small amounts of contrast are identified within colonic loops. There is persistent mild dilatation of small bowel loops. No evidence for free intraperitoneal air or pneumatosis.  IMPRESSION: 1. Interval elimination of majority of colonic contrast. 2. Persistent small bowel dilatation.   Electronically Signed   By: Nolon Nations M.D.   On: 09/30/2014 09:12    Medications: . antiseptic oral rinse  7 mL Mouth Rinse q12n4p  . chlorhexidine  15 mL Mouth Rinse BID  . heparin subcutaneous  5,000 Units Subcutaneous 3 times per day    Assessment/Plan 1. Probably ileus (vs SBO) Prior surgeries include a lap chole, and another surgical procedure, he has lower midline abdominal incision. Tolerated sips from floor - will advance to clear liquid tray. Still with loose stools. 2. Hx of thoracotomy for thymoma 3. Hx of prostate cancer 4. Hx of stroke/dementia  5. DVT prophylaxis - SQ Heparin/SCD 6.  Severe deconditioning and malnutrition   Plan:  I don't think we have an answer as to what caused his ileus/obstruction.  I will advance him to full liquids and  see how he does.  Dr. Lubertha South literally got help and got him out of bed this AM.  It took 3 people to do it.    LOS: 8 days    Fabiha Rougeau 10/01/2014

## 2014-10-02 LAB — CBC
HCT: 38.8 % — ABNORMAL LOW (ref 39.0–52.0)
Hemoglobin: 12.8 g/dL — ABNORMAL LOW (ref 13.0–17.0)
MCH: 31.8 pg (ref 26.0–34.0)
MCHC: 33 g/dL (ref 30.0–36.0)
MCV: 96.3 fL (ref 78.0–100.0)
Platelets: 284 10*3/uL (ref 150–400)
RBC: 4.03 MIL/uL — ABNORMAL LOW (ref 4.22–5.81)
RDW: 13.4 % (ref 11.5–15.5)
WBC: 12.1 10*3/uL — ABNORMAL HIGH (ref 4.0–10.5)

## 2014-10-02 LAB — BASIC METABOLIC PANEL
Anion gap: 6 (ref 5–15)
BUN: 13 mg/dL (ref 6–23)
CALCIUM: 11.2 mg/dL — AB (ref 8.4–10.5)
CO2: 25 mmol/L (ref 19–32)
CREATININE: 1.11 mg/dL (ref 0.50–1.35)
Chloride: 100 mmol/L (ref 96–112)
GFR, EST AFRICAN AMERICAN: 67 mL/min — AB (ref >90)
GFR, EST NON AFRICAN AMERICAN: 58 mL/min — AB (ref >90)
Glucose, Bld: 107 mg/dL — ABNORMAL HIGH (ref 70–99)
POTASSIUM: 3.9 mmol/L (ref 3.5–5.1)
Sodium: 131 mmol/L — ABNORMAL LOW (ref 135–145)

## 2014-10-02 NOTE — Plan of Care (Signed)
Problem: Phase III Progression Outcomes Goal: Voiding independently Outcome: Progressing Patient has  a condom cath.     

## 2014-10-02 NOTE — Progress Notes (Signed)
[facility] PROGRESS NOTE  Jose Crawford JJH:[id number] DOB: March 09, 1926 DOA: 09/23/2014 PCP: [facility]  Interim Summary:   Appreciate Surgery. Jose Crawford is an 81 y.o. male with a PMH of dementia, thymoma status post left thoracotomy, prostate cancer in remission status post prostatectomy, history of laparoscopic cholecystectomy who was admitted 09/23/14 with chief complaint of abdominal pain, nausea and vomiting. CT scan done on admission showed a small bowel obstruction with a transition point. Patient treated for small bowel obstruction/ileus during this hospitalization. He was treated with bowel rest, IV fluid resuscitation, NG tube placement. Symptoms improved after conservative management. He had a Gastrografin study done on 09/29/2014 which showed contrast material in the colon. At this point his NG tube was discontinued and diet was advanced. During this hospitalization he has had diarrhea with stool for C. difficile and stool pathogen panel BACK negative. Diarrhea markedly improved by 10/01/2014, rectal tube has been discontinued. Patient remains profoundly deconditioned, he was assisted out of bed to chair, was maximum assist. Physical therapy has been consulted. Plan to advance diet per surgery. Patient complaints of abdominal pain. Will require skilled nursing facility placement.   Assessment/Plan:   Principal Problem:  SBO/Ileus - Patient showing gradual clinical improvement but still with abdominal pain -Clinically improving as he is tolerating clears. -Repeat Abd film showing interval elimination of colonic contrast, persistent small bowel dilatation  -Defer to Surgery   Active Problems:  Hypokalemia -Likely secondary to GI loss -Potassium improved to 3.9.after administering Kdur    Dementia - Patient with history of advanced dementia -Plan for SNF placement when stable   Hypernatremia - Stable  Diarrhea -On 09/24/2014  stool for C. diff was negative -GI panel negative -Per RN resolved. Rectal tube has been discontinued.    DVT Prophylaxis - Continue SCDs.  Code Status: Full. Family Communication: No family at bedside Disposition Plan: May need SNF, lives with daugther.    IV Access:    Peripheral IV   Procedures and diagnostic studies:   Ct Abdomen Pelvis W Contrast 09/23/2014: Distal small bowel obstruction with focal transition point at the level of the distal terminal ileum but no mass identified. This could indicate adhesion or stricture. A mass could be obscured. Pelvic free fluid noted without free air identified.   Dg Abd Acute W/chest 09/23/2014: 1. Small bowel obstruction pattern identified. No free intraperitoneal air are identified.   Dg Abd Portable 1v 09/23/2014: Nasogastric tube tip at the distal esophagus. Advancement by 13 cm required for the side port to reach the stomach.   Medical Consultants:    Jackolyn Confer, MD, Surgery  Anti-Infectives:    None.  Subjective:    Jose Crawford is pleasant, cooperative, in no acute distress. Looks better. He c/o abdominal pain.       Objective: Filed Vitals:   10/02/14 1307  BP: 123/77  Pulse: 72  Temp: 97 F (36.1 C)  Resp: 16    Intake/Output Summary (Last 24 hours) at 10/02/14 2221 Last data filed at 10/02/14 1845  Gross per 24 hour  Intake    720 ml  Output   2400 ml  Net  -1680 ml   Filed Weights   09/28/14 0544 09/30/14 0504 10/02/14 0500  Weight: 85.2 kg (187 lb 13.3 oz) 85.1 kg (187 lb 9.8 oz) 82.4 kg (181 lb 10.5 oz)    Exam:   General:  Comfortable at rest.  Cardiovascular: S1-S2 normal. No murmurs. Pulse regular.  Respiratory: Good  air entry bilaterally. No rhonchi or rales.  Abdomen: Abdominal fullness.  Musculoskeletal: No pedal edema   Neurological: Intact  Data Reviewed: Basic Metabolic Panel:  Recent Labs Lab 09/27/14 0350 09/28/14 0800  09/29/14 0435 09/30/14 0452 10/01/14 0444 10/02/14 0531  NA 139 139 141 135 136 131*  K 3.1* 3.3* 4.3 3.5 4.4 3.9  CL 108 111 110 106 108 100  CO2 24 23 26 24 26 25   GLUCOSE 86 90 103* 101* 106* 107*  BUN 31* 26* 18 12 11 13   CREATININE 1.13 0.98 1.01 0.92 1.02 1.11  CALCIUM 10.3 10.3 10.9* 10.4 10.9* 11.2*  MG 1.7  --   --   --   --   --    Liver Function Tests: No results for input(s): AST, ALT, ALKPHOS, BILITOT, PROT, ALBUMIN in the last 168 hours. No results for input(s): LIPASE, AMYLASE in the last 168 hours. No results for input(s): AMMONIA in the last 168 hours. CBC:  Recent Labs Lab 09/26/14 0352 09/27/14 0350 09/29/14 0435 09/30/14 0452 10/02/14 0531  WBC 5.8 5.9 7.5 11.3* 12.1*  HGB 11.6* 11.8* 12.0* 12.1* 12.8*  HCT 35.4* 35.5* 36.5* 36.8* 38.8*  MCV 95.7 96.7 96.8 95.6 96.3  PLT 256 286 286 273 284   Cardiac Enzymes: No results for input(s): CKTOTAL, CKMB, CKMBINDEX, TROPONINI in the last 168 hours. BNP (last 3 results) No results for input(s): BNP in the last 8760 hours.  ProBNP (last 3 results) No results for input(s): PROBNP in the last 8760 hours.  CBG:  Recent Labs Lab 09/26/14 0533  GLUCAP 75    Recent Results (from the past 240 hour(s))  Clostridium Difficile by PCR     Status: None   Collection Time: 09/24/14 10:56 AM  Result Value Ref Range Status   C difficile by pcr NEGATIVE NEGATIVE Final     Studies: No results found.  Scheduled Meds: . antiseptic oral rinse  7 mL Mouth Rinse q12n4p  . heparin subcutaneous  5,000 Units Subcutaneous 3 times per day   Continuous Infusions:    Time spent: 25 minutes    Ciella Obi  [facility] Pager [phone]. If 7PM-7AM, please contact night-coverage at [email or web address], password [facility] 10/02/2014, 10:21 PM  LOS: 9 days

## 2014-10-02 NOTE — Progress Notes (Signed)
  Subjective: He seems to be improving.  When you ask you get various answers.  On exam he seems better. He is still confused, but does not seem as sad or tearful.  The CNA says he ate very well yesterday with the full liquids.  He is incontinent of urine, but otherwise looks better.  Objective: Vital signs in last 24 hours: Temp:  [97.3 F (36.3 C)-98.1 F (36.7 C)] 97.3 F (36.3 C) (03/09 0558) Pulse Rate:  [65-71] 71 (03/09 0558) Resp:  [16] 16 (03/09 0558) BP: (110-148)/(62-76) 138/76 mmHg (03/09 0558) SpO2:  [97 %-99 %] 97 % (03/09 0558) Weight:  [181 lb 10.5 oz (82.4 kg)] 181 lb 10.5 oz (82.4 kg) (03/09 0500) Last BM Date: 10/01/14 600 PO recorded 1 BM recorded Afebrile, VSS WBC still trending up, other labs stable. No film Intake/Output from previous day: 03/08 0701 - 03/09 0700 In: 600 [P.O.:600] Out: 3701 [Urine:3700; Stool:1] Intake/Output this shift:    General appearance: alert, cooperative, no distress and very confused. GI: soft, complained of tenderness right mid abdomen once, but not really reproducible.  He is not distended, + BS, +BM  Lab Results:   Recent Labs  09/30/14 0452 10/02/14 0531  WBC 11.3* 12.1*  HGB 12.1* 12.8*  HCT 36.8* 38.8*  PLT 273 284    BMET  Recent Labs  10/01/14 0444 10/02/14 0531  NA 136 131*  K 4.4 3.9  CL 108 100  CO2 26 25  GLUCOSE 106* 107*  BUN 11 13  CREATININE 1.02 1.11  CALCIUM 10.9* 11.2*   PT/INR No results for input(s): LABPROT, INR in the last 72 hours.  No results for input(s): AST, ALT, ALKPHOS, BILITOT, PROT, ALBUMIN in the last 168 hours.   Lipase     Component Value Date/Time   LIPASE 18 09/23/2014 1639     Studies/Results: Dg Abd Portable 1v  09/30/2014   CLINICAL DATA:  Complaining of feeling bad and moaning. Cannot express where the pain is. Nontender abdomen. Foley catheter and rectal tube.  EXAM: PORTABLE ABDOMEN - 1 VIEW  COMPARISON:  09/27/2014  FINDINGS: Nasogastric tube is no  longer visible and may have been withdrawn or the note. Surgical clips overlie the right upper quadrant of the abdomen. Colonic contrast has largely passed from the gastrointestinal tract. Small amounts of contrast are identified within colonic loops. There is persistent mild dilatation of small bowel loops. No evidence for free intraperitoneal air or pneumatosis.  IMPRESSION: 1. Interval elimination of majority of colonic contrast. 2. Persistent small bowel dilatation.   Electronically Signed   By: Nolon Nations M.D.   On: 09/30/2014 09:12    Medications: . antiseptic oral rinse  7 mL Mouth Rinse q12n4p  . heparin subcutaneous  5,000 Units Subcutaneous 3 times per day    Assessment/Plan 1. Probably ileus (vs SBO) Prior surgeries include a lap chole, and another surgical procedure, he has lower midline abdominal incision. 2. Hx of thoracotomy for thymoma 3. Hx of prostate cancer 4. Hx of stroke/dementia  5. DVT prophylaxis - SQ Heparin/SCD 6. Severe deconditioning and malnutrition   Plan:   Advance to a soft diet.  See how he does. Dr. Coralyn Pear is working on mobilizing more.  He was seen yesterday by PT.   LOS: 9 days    Simeon Vera 10/02/2014

## 2014-10-03 LAB — BASIC METABOLIC PANEL
Anion gap: 5 (ref 5–15)
BUN: 18 mg/dL (ref 6–23)
CO2: 23 mmol/L (ref 19–32)
Calcium: 11.8 mg/dL — ABNORMAL HIGH (ref 8.4–10.5)
Chloride: 106 mmol/L (ref 96–112)
Creatinine, Ser: 1.12 mg/dL (ref 0.50–1.35)
GFR calc Af Amer: 66 mL/min — ABNORMAL LOW (ref >90)
GFR, EST NON AFRICAN AMERICAN: 57 mL/min — AB (ref >90)
Glucose, Bld: 111 mg/dL — ABNORMAL HIGH (ref 70–99)
POTASSIUM: 4.4 mmol/L (ref 3.5–5.1)
SODIUM: 134 mmol/L — AB (ref 135–145)

## 2014-10-03 LAB — CBC
HCT: 39.4 % (ref 39.0–52.0)
HEMOGLOBIN: 13.1 g/dL (ref 13.0–17.0)
MCH: 31.6 pg (ref 26.0–34.0)
MCHC: 33.2 g/dL (ref 30.0–36.0)
MCV: 94.9 fL (ref 78.0–100.0)
PLATELETS: 269 10*3/uL (ref 150–400)
RBC: 4.15 MIL/uL — ABNORMAL LOW (ref 4.22–5.81)
RDW: 13.2 % (ref 11.5–15.5)
WBC: 11.1 10*3/uL — AB (ref 4.0–10.5)

## 2014-10-03 NOTE — Progress Notes (Signed)
Pt for discharge to [facility].  CSW facilitated pt discharge needs including contacting facility, faxing pt discharge information via [facility], discussing with pt daughter, Otila Kluver via telephone, providing RN phone number to call report, and arranging ambulance transport via [facility] for pt to [facility].  Pt daughter appreciative of CSW assistance with transition to [facility]. Pt daughter stated that she had to work today, but pt grandson planned to complete paperwork at [place] and pt daughter would go to [place] to see pt later.   No further social work needs identified at this time.  CSW signing off.   Alison Murray, MSW, [place] Work [phone]

## 2014-10-03 NOTE — Care Management Note (Signed)
CARE MANAGEMENT NOTE 10/03/2014  Patient:  Jose Crawford, Jose Crawford   Account Number:  [id number]  Date Initiated:  10/03/2014  Documentation initiated by:  Marney Doctor  Subjective/Objective Assessment:   81 yo admitted with SBO. history of dementia, thymoma status post resection, prostate cancer in remission     Action/Plan:   From home with family   Anticipated DC Date:  10/03/2014   Anticipated DC Plan:  SKILLED NURSING FACILITY  In-house referral  Clinical Social Worker      DC Planning Services  CM consult      Choice offered to / List presented to:             Status of service:  Completed, signed off Medicare Important Message given?  YES (If response is "NO", the following Medicare IM given date fields will be blank) Date Medicare IM given:  10/03/2014 Medicare IM given by:  Marney Doctor Date Additional Medicare IM given:   Additional Medicare IM given by:    Discharge Disposition:    Per UR Regulation:  Reviewed for med. necessity/level of care/duration of stay  If discussed at [place] of Stay Meetings, dates discussed:    Comments:  10/02/14 Marney Doctor RN,BSN,NCM [phone]

## 2014-10-03 NOTE — Progress Notes (Signed)
  Subjective: He looks OK, sometimes he acts like it hurts when you press on his abdomen, other times he seems fine.  Not quite as sad this AM  He has no gown on this AM.  He cannot tell me how he did with PO's yesterday.  Condom cath.  Objective: Vital signs in last 24 hours: Temp:  [97 F (36.1 C)-98 F (36.7 C)] 97.3 F (36.3 C) (03/10 0649) Pulse Rate:  [72-77] 77 (03/10 0649) Resp:  [16] 16 (03/10 0649) BP: (123-136)/(71-86) 126/86 mmHg (03/10 0649) SpO2:  [93 %-100 %] 98 % (03/10 0649) Weight:  [176 lb 5.9 oz (80 kg)] 176 lb 5.9 oz (80 kg) (03/10 0703) Last BM Date: 10/02/14 960 PO recorded No BM recorded in I/O but one in comprehensive record. WBC trending down some. Intake/Output from previous day: 03/09 0701 - 03/10 0700 In: 960 [P.O.:960] Out: 2025 [Urine:2025] Intake/Output this shift:    General appearance: alert and confused, cannot really remember much.  Not quite as sad as earlier this week. GI: soft, at times he seems tender and other times not.  He is not distended, he has some hypoactive bowel sounds.  Lab Results:   Recent Labs  10/02/14 0531 10/03/14 0456  WBC 12.1* 11.1*  HGB 12.8* 13.1  HCT 38.8* 39.4  PLT 284 269    BMET  Recent Labs  10/02/14 0531 10/03/14 0456  NA 131* 134*  K 3.9 4.4  CL 100 106  CO2 25 23  GLUCOSE 107* 111*  BUN 13 18  CREATININE 1.11 1.12  CALCIUM 11.2* 11.8*   PT/INR No results for input(s): LABPROT, INR in the last 72 hours.  No results for input(s): AST, ALT, ALKPHOS, BILITOT, PROT, ALBUMIN in the last 168 hours.   Lipase     Component Value Date/Time   LIPASE 18 09/23/2014 1639     Studies/Results: No results found.  Medications: . antiseptic oral rinse  7 mL Mouth Rinse q12n4p  . heparin subcutaneous  5,000 Units Subcutaneous 3 times per day    Assessment/Plan  1. Probably ileus (vs SBO) Prior surgeries include a lap chole, and another surgical procedure, he has lower midline abdominal  incision. 2. Hx of thoracotomy for thymoma 3. Hx of prostate cancer 4. Hx of stroke/dementia  5. DVT prophylaxis - SQ Heparin/SCD 6. Severe deconditioning and malnutrition  Plan: He is on a soft diet. One soft BM yesterday.  If we can be of assistance please call.    LOS: 10 days    Svea Pusch 10/03/2014

## 2014-10-03 NOTE — Discharge Summary (Signed)
Jose Crawford, is a 81 y.o. male  DOB January 15, 1926  MRN [id number].  Admission date:  09/23/2014  Admitting Physician  Rise Patience, MD  Discharge Date:  10/03/2014   Primary MD  [place]  Recommendations for primary care physician for things to follow:  Please repeat CBC/CMP in the next week or so to ensure complete resolution of SBO as patient poor historian   Admission Diagnosis   Abdominal pain, generalized [R10.84] SBO (small bowel obstruction) [K56.69]   Discharge Diagnosis  Abdominal pain, generalized [R10.84] SBO (small bowel obstruction) [K56.69]   Active Problems:   Dementia      Hospital Course  Jose Crawford is an 81 y.o. male with Alzheimer's dementia, thymoma status post left thoracotomy, prostate cancer in remission status post prostatectomy, history of laparoscopic cholecystectomy who was admitted 09/23/14 with chief complaint of abdominal pain, nausea and vomiting. CT scan done on admission showed a small bowel obstruction with a transition point. Patient was conservatively treated for small bowel obstruction/ileus during this hospitalization with general surgery help. He was treated with bowel rest, IV fluid resuscitation, NG tube placement. Symptoms improved after conservative management. He had a Gastrografin study done on 09/29/2014 which showed contrast material in the colon. At this point his NG tube was discontinued and diet was advanced. During this hospitalization he has had diarrhea with stool for C. difficile and stool pathogen panel BACK negative. Diarrhea markedly improved by 10/01/2014, rectal tube was discontinued. Patient remains profoundly deconditioned therefore referral to skilled nursing facility. He appears to be close to his baseline but  unfortunately is not able to give meaningful history because of dementia. I have updated his daughter Otila Kluver over the phone regarding discharge plans. Patient will be discharged to [facility] later today.  Discharge Condition Relatively Stable.  Consults obtained  General surgery  Follow UP  PCP at SNF   Discharge Instructions  and  Discharge Medications  Discharge Instructions    Diet - low sodium heart healthy    Complete by:  As directed      Increase activity slowly    Complete by:  As directed             Medication List    STOP taking these medications        levofloxacin 500 MG tablet  Commonly known as:  LEVAQUIN      TAKE these medications        acetaminophen 500 MG tablet  Commonly known as:  TYLENOL  Take 1,000 mg by mouth at bedtime.     FISH OIL PO  Take 2 capsules by mouth every evening.     Melatonin 5 MG Caps  Take 15 capsules by mouth at bedtime.     multivitamin with minerals tablet  Take 1 tablet by mouth daily.     St Johns Wort 300 MG Tabs  Take 1 tablet by mouth at bedtime.     TURMERIC PO  Take 1,000 mg by mouth daily.  VITAMIN C PO  Take 2 capsules by mouth daily.        Diet and Activity recommendation: See Discharge Instructions above  Major procedures and Radiology Reports - PLEASE review detailed and final reports for all details, in brief -    Dg Abd 1 View  09/26/2014   CLINICAL DATA:  Nasogastric tube placement  EXAM: ABDOMEN - 1 VIEW  COMPARISON:  09/25/2014 at 10:30 a.m.  FINDINGS: Nasogastric tube tip overlaps the proximal stomach, with side port at the GE junction.  Ongoing small bowel obstruction with dilated loops still seen in the central abdomen. Chronic elevation of the right diaphragm.  IMPRESSION: 1. Nasogastric tube tip at the proximal stomach, with side port at the GE junction. Advancement by 5 cm would secure positioning. 2. Ongoing small bowel obstruction.   Electronically Signed   By: Monte Fantasia M.D.    On: 09/26/2014 00:20   Ct Abdomen Pelvis W Contrast  09/23/2014   CLINICAL DATA:  Nausea, vomiting, diarrhea for 3 days  EXAM: CT ABDOMEN AND PELVIS WITH CONTRAST  TECHNIQUE: Multidetector CT imaging of the abdomen and pelvis was performed using the standard protocol following bolus administration of intravenous contrast.  CONTRAST:  89mL OMNIPAQUE IOHEXOL 300 MG/ML SOLN, 141mL OMNIPAQUE IOHEXOL 300 MG/ML SOLN  COMPARISON:  Abdominal radiographs same date, CT abdomen/ pelvis 07/29/2012  FINDINGS: Lower chest: Bilateral lower lobe curvilinear atelectasis or scarring noted. Trace left pleural fluid is present.  Hepatobiliary: Sub cm too small to characterize hepatic hypodense lesions are reidentified. Cholecystectomy clips are noted.  Pancreas: Pancreatic calcifications are identified, some new since the prior exam. This may signify interval pancreatitis or progression of adjacent vascular calcification with volume averaging. No pancreatic ductal dilatation or peripancreatic collection is identified.  Spleen: Normal  Adrenals/Urinary Tract: Mild bilateral adrenal fullness is reidentified without measurable mass. The right adrenal gland is imaged at an angle, simulating a mass. Nonobstructing 7 mm dominant right mid renal calculus identified. Bilateral renal cortical cysts and too small to characterize hypodense lesions are identified, largest 1.9 cm at the mid left kidney image 42. No hydroureteronephrosis. Retro aortic left renal vein.  Stomach/Bowel: Diffuse small bowel dilatation is identified to the level of the distal terminal ileum, with apparent focal transition point image 67 series 2, image 60 series 4, and image 49 series 5. Maximal small bowel diameter measures 4.2 cm image 54. The appendix is not identified. A small amount of free fluid is noted in the pelvis. Colon is decompressed and unremarkable with the exception of a few colonic diverticuli without evidence for diverticulitis.  Vascular/Lymphatic:  Severe atheromatous aortic calcification without aneurysm. No lymphadenopathy.  Reproductive: Penile implant partly visualized. Prostate presumed surgically absent with probable TURP defect.  Musculoskeletal: Multilevel disc degenerative changes noted in the spine. No lytic or sclerotic osseous lesion is identified.  Other: No free air.  IMPRESSION: Distal small bowel obstruction with focal transition point at the level of the distal terminal ileum but no mass identified. This could indicate adhesion or stricture. A mass could be obscured.  Pelvic free fluid noted without free air identified.   Electronically Signed   By: Conchita Paris M.D.   On: 09/23/2014 20:11   Dg Abd 2 Views  09/24/2014   CLINICAL DATA:  Small bowel obstruction, diarrhea, abdominal pain.  EXAM: ABDOMEN - 2 VIEW  COMPARISON:  09/23/2014  FINDINGS: NG tube tip is in the proximal stomach with the side port near the GE junction. Mild prominence of  small bowel loops with scattered air-fluid levels. This has improved slightly since prior study. Gas within nondistended colon. Prior cholecystectomy.  IMPRESSION: Advancement of NG tube with the tip in the proximal stomach and side port near the GE junction.  Mildly dilated small bowel loops with air-fluid levels, slightly improved since prior study.   Electronically Signed   By: Rolm Baptise M.D.   On: 09/24/2014 09:24   Dg Abd Acute W/chest  09/23/2014   CLINICAL DATA:  Back pain  EXAM: ACUTE ABDOMEN SERIES (ABDOMEN 2 VIEW & CHEST 1 VIEW)  COMPARISON:  None.  FINDINGS: Normal heart size. Calcified atherosclerotic plaque involves the thoracic aorta. Left midlung scarring noted. There is asymmetric elevation of the right hemidiaphragm. Dilated loops of small bowel are identified measuring up to 5.6 cm. Dynamic air-fluid levels are identified on the left lateral decubitus radiograph no free intraperitoneal air are identified.  IMPRESSION: 1. Small bowel obstruction pattern identified. No free  intraperitoneal air are identified.   Electronically Signed   By: Kerby Moors M.D.   On: 09/23/2014 16:51   Dg Abd Portable 1v  09/30/2014   CLINICAL DATA:  Complaining of feeling bad and moaning. Cannot express where the pain is. Nontender abdomen. Foley catheter and rectal tube.  EXAM: PORTABLE ABDOMEN - 1 VIEW  COMPARISON:  09/27/2014  FINDINGS: Nasogastric tube is no longer visible and may have been withdrawn or the note. Surgical clips overlie the right upper quadrant of the abdomen. Colonic contrast has largely passed from the gastrointestinal tract. Small amounts of contrast are identified within colonic loops. There is persistent mild dilatation of small bowel loops. No evidence for free intraperitoneal air or pneumatosis.  IMPRESSION: 1. Interval elimination of majority of colonic contrast. 2. Persistent small bowel dilatation.   Electronically Signed   By: Nolon Nations M.D.   On: 09/30/2014 09:12   Dg Abd Portable 1v  09/27/2014   CLINICAL DATA:  Small bowel obstruction. Evaluate degree of obstruction.  EXAM: PORTABLE ABDOMEN - 1 VIEW  COMPARISON:  CT 09/23/2014.  Radiographs 09/25/2014.  FINDINGS: Supine views were obtained 8 hours after contrast administration per nasogastric tube per SBO protocol.  Contrast material is within the colon which is decompressed. The contrast extends to the rectum. There is persistent moderate small bowel distension. Nasogastric tube is in place. No extraluminal contrast or free intraperitoneal air identified.  IMPRESSION: The contrast administered per nasogastric tube has passed into the colon which is decompressed. There is persistent small bowel distension.   Electronically Signed   By: Richardean Sale M.D.   On: 09/27/2014 19:08   Dg Abd Portable 1v  09/25/2014   CLINICAL DATA:  Followup small bowel obstruction. Prostate carcinoma.  EXAM: PORTABLE ABDOMEN - 1 VIEW  COMPARISON:  09/24/2014  FINDINGS: Orogastric tube tip is again seen overlying the stomach.  Multiple dilated small bowel loops are again seen within the central abdomen. Scattered bowel gas also seen throughout colon which is nondilated. This is not significantly changed and remains suspicious for partial small bowel obstruction. Surgical clips again seen within the upper abdomen and lower pelvis.  IMPRESSION: No significant change in degree of small bowel dilatation, suspicious for partial small bowel obstruction.   Electronically Signed   By: Earle Gell M.D.   On: 09/25/2014 12:38   Dg Abd Portable 1v  09/23/2014   CLINICAL DATA:  Nasogastric tube Check  EXAM: PORTABLE ABDOMEN - 1 VIEW  COMPARISON:  Abdominal CT from the same day  FINDINGS: Nasogastric tube tip is at the level of the distal esophagus, and for the side port to reach the stomach advancement by 13 cm required.  Known small bowel obstruction. There is normal appearing urinary contrast excretion. Incidentally low bladder in the setting of prostatectomy.  These results were called by telephone at the time of interpretation on 09/23/2014 at 10:26 pm to [facility], who verbally acknowledged these results.  IMPRESSION: Nasogastric tube tip at the distal esophagus. Advancement by 13 cm required for the side port to reach the stomach.   Electronically Signed   By: Monte Fantasia M.D.   On: 09/23/2014 22:28    Micro Results   Recent Results (from the past 240 hour(s))  Clostridium Difficile by PCR     Status: None   Collection Time: 09/24/14 10:56 AM  Result Value Ref Range Status   C difficile by pcr NEGATIVE NEGATIVE Final       Today   Subjective:   Jose Crawford today has no headache,no chest or abdominal pain,no new weakness tingling or numbness, feels better.  He is not able to give a meaningful history.   Objective:   Blood pressure 126/86, pulse 77, temperature 97.3 F (36.3 C), temperature source Oral, resp. rate 16, height 6' (1.829 m), weight 80 kg (176 lb 5.9 oz), SpO2 98 %.   Intake/Output Summary (Last 24  hours) at 10/03/14 1140 Last data filed at 10/03/14 0923  Gross per 24 hour  Intake    960 ml  Output   2350 ml  Net  -1390 ml    Exam Awake Alert, Oriented in person, No new F.N deficits, Normal affect [place].AT,PERRAL Supple Neck,No JVD, No cervical lymphadenopathy appriciated.  Symmetrical Chest wall movement, Good air movement bilaterally, CTAB RRR,No Gallops,Rubs or new Murmurs, No Parasternal Heave +ve B.Sounds, Abd Soft, Non tender, No organomegaly appriciated, No rebound -guarding or rigidity. No Cyanosis, Clubbing or edema, No new Rash or bruise  Data Review   CBC w Diff: Lab Results  Component Value Date   WBC 11.1* 10/03/2014   WBC 9.3 12/21/2011   HGB 13.1 10/03/2014   HGB 13.3 12/21/2011   HCT 39.4 10/03/2014   HCT 39.8 12/21/2011   PLT 269 10/03/2014   PLT 429* 12/21/2011   LYMPHOPCT 8* 09/24/2014   LYMPHOPCT 19.8 12/21/2011   MONOPCT 11 09/24/2014   MONOPCT 5.7 12/21/2011   EOSPCT 0 09/24/2014   EOSPCT 4.5 12/21/2011   BASOPCT 0 09/24/2014   BASOPCT 1.1 12/21/2011    CMP: Lab Results  Component Value Date   NA 134* 10/03/2014   K 4.4 10/03/2014   CL 106 10/03/2014   CO2 23 10/03/2014   BUN 18 10/03/2014   CREATININE 1.12 10/03/2014   PROT 5.3* 09/24/2014   ALBUMIN 3.2* 09/24/2014   BILITOT 0.9 09/24/2014   ALKPHOS 50 09/24/2014   AST 20 09/24/2014   ALT 18 09/24/2014  .   Total Time in preparing paper work, data evaluation and todays exam - 35 minutes  Ellyssa Zagal M.D on 10/03/2014 at 11:40 AM  [facility] Office  [phone]

## 2014-10-25 ENCOUNTER — Encounter (HOSPITAL_COMMUNITY): Payer: Self-pay | Admitting: Emergency Medicine

## 2014-10-25 ENCOUNTER — Emergency Department (HOSPITAL_COMMUNITY): Payer: Medicare Other

## 2014-10-25 ENCOUNTER — Inpatient Hospital Stay (HOSPITAL_COMMUNITY)
Admission: EM | Admit: 2014-10-25 | Discharge: 2014-10-28 | DRG: 871 | Disposition: A | Discharge status: Hospice | Payer: Medicare Other | Attending: Internal Medicine | Admitting: Internal Medicine

## 2014-10-25 DIAGNOSIS — Z87891 Personal history of nicotine dependence: Secondary | ICD-10-CM

## 2014-10-25 DIAGNOSIS — Z8673 Personal history of transient ischemic attack (TIA), and cerebral infarction without residual deficits: Secondary | ICD-10-CM | POA: Diagnosis not present

## 2014-10-25 DIAGNOSIS — Z515 Encounter for palliative care: Secondary | ICD-10-CM | POA: Insufficient documentation

## 2014-10-25 DIAGNOSIS — R6520 Severe sepsis without septic shock: Secondary | ICD-10-CM | POA: Insufficient documentation

## 2014-10-25 DIAGNOSIS — K5669 Other intestinal obstruction: Secondary | ICD-10-CM | POA: Diagnosis present

## 2014-10-25 DIAGNOSIS — R451 Restlessness and agitation: Secondary | ICD-10-CM | POA: Diagnosis not present

## 2014-10-25 DIAGNOSIS — N390 Urinary tract infection, site not specified: Secondary | ICD-10-CM | POA: Diagnosis present

## 2014-10-25 DIAGNOSIS — Z8546 Personal history of malignant neoplasm of prostate: Secondary | ICD-10-CM

## 2014-10-25 DIAGNOSIS — R6521 Severe sepsis with septic shock: Secondary | ICD-10-CM | POA: Diagnosis present

## 2014-10-25 DIAGNOSIS — R109 Unspecified abdominal pain: Secondary | ICD-10-CM | POA: Insufficient documentation

## 2014-10-25 DIAGNOSIS — Z85828 Personal history of other malignant neoplasm of skin: Secondary | ICD-10-CM | POA: Diagnosis not present

## 2014-10-25 DIAGNOSIS — N179 Acute kidney failure, unspecified: Secondary | ICD-10-CM | POA: Diagnosis present

## 2014-10-25 DIAGNOSIS — Z66 Do not resuscitate: Secondary | ICD-10-CM | POA: Diagnosis present

## 2014-10-25 DIAGNOSIS — M1990 Unspecified osteoarthritis, unspecified site: Secondary | ICD-10-CM | POA: Diagnosis present

## 2014-10-25 DIAGNOSIS — A419 Sepsis, unspecified organism: Secondary | ICD-10-CM | POA: Diagnosis present

## 2014-10-25 DIAGNOSIS — K5660 Unspecified intestinal obstruction: Secondary | ICD-10-CM | POA: Diagnosis present

## 2014-10-25 DIAGNOSIS — F0390 Unspecified dementia without behavioral disturbance: Secondary | ICD-10-CM | POA: Diagnosis present

## 2014-10-25 DIAGNOSIS — G9340 Encephalopathy, unspecified: Secondary | ICD-10-CM | POA: Diagnosis present

## 2014-10-25 DIAGNOSIS — R509 Fever, unspecified: Secondary | ICD-10-CM

## 2014-10-25 DIAGNOSIS — Z7189 Other specified counseling: Secondary | ICD-10-CM | POA: Insufficient documentation

## 2014-10-25 DIAGNOSIS — Z809 Family history of malignant neoplasm, unspecified: Secondary | ICD-10-CM | POA: Diagnosis not present

## 2014-10-25 DIAGNOSIS — R1084 Generalized abdominal pain: Secondary | ICD-10-CM | POA: Diagnosis not present

## 2014-10-25 DIAGNOSIS — J189 Pneumonia, unspecified organism: Secondary | ICD-10-CM

## 2014-10-25 DIAGNOSIS — K56609 Unspecified intestinal obstruction, unspecified as to partial versus complete obstruction: Secondary | ICD-10-CM | POA: Diagnosis present

## 2014-10-25 LAB — URINALYSIS, ROUTINE W REFLEX MICROSCOPIC
GLUCOSE, UA: NEGATIVE mg/dL
KETONES UR: NEGATIVE mg/dL
Nitrite: NEGATIVE
PROTEIN: 30 mg/dL — AB
Specific Gravity, Urine: 1.018 (ref 1.005–1.030)
Urobilinogen, UA: 0.2 mg/dL (ref 0.0–1.0)
pH: 5.5 (ref 5.0–8.0)

## 2014-10-25 LAB — LIPASE, BLOOD: LIPASE: 16 U/L (ref 11–59)

## 2014-10-25 LAB — COMPREHENSIVE METABOLIC PANEL
ALT: 13 U/L (ref 0–53)
AST: 32 U/L (ref 0–37)
Albumin: 3.1 g/dL — ABNORMAL LOW (ref 3.5–5.2)
Alkaline Phosphatase: 93 U/L (ref 39–117)
Anion gap: 14 (ref 5–15)
BUN: 35 mg/dL — ABNORMAL HIGH (ref 6–23)
CALCIUM: 10.9 mg/dL — AB (ref 8.4–10.5)
CO2: 24 mmol/L (ref 19–32)
Chloride: 103 mmol/L (ref 96–112)
Creatinine, Ser: 2.71 mg/dL — ABNORMAL HIGH (ref 0.50–1.35)
GFR calc non Af Amer: 20 mL/min — ABNORMAL LOW (ref >90)
GFR, EST AFRICAN AMERICAN: 23 mL/min — AB (ref >90)
GLUCOSE: 119 mg/dL — AB (ref 70–99)
POTASSIUM: 3.7 mmol/L (ref 3.5–5.1)
Sodium: 141 mmol/L (ref 135–145)
TOTAL PROTEIN: 5.8 g/dL — AB (ref 6.0–8.3)
Total Bilirubin: 0.8 mg/dL (ref 0.3–1.2)

## 2014-10-25 LAB — URINE MICROSCOPIC-ADD ON

## 2014-10-25 LAB — CBC WITH DIFFERENTIAL/PLATELET
BASOS ABS: 0 10*3/uL (ref 0.0–0.1)
Basophils Relative: 0 % (ref 0–1)
EOS ABS: 0 10*3/uL (ref 0.0–0.7)
Eosinophils Relative: 0 % (ref 0–5)
HCT: 36.4 % — ABNORMAL LOW (ref 39.0–52.0)
Hemoglobin: 11.8 g/dL — ABNORMAL LOW (ref 13.0–17.0)
LYMPHS PCT: 5 % — AB (ref 12–46)
Lymphs Abs: 1.1 10*3/uL (ref 0.7–4.0)
MCH: 31.4 pg (ref 26.0–34.0)
MCHC: 32.4 g/dL (ref 30.0–36.0)
MCV: 96.8 fL (ref 78.0–100.0)
Monocytes Absolute: 0.4 10*3/uL (ref 0.1–1.0)
Monocytes Relative: 2 % — ABNORMAL LOW (ref 3–12)
NEUTROS ABS: 19.9 10*3/uL — AB (ref 1.7–7.7)
NEUTROS PCT: 93 % — AB (ref 43–77)
PLATELETS: 226 10*3/uL (ref 150–400)
RBC: 3.76 MIL/uL — ABNORMAL LOW (ref 4.22–5.81)
RDW: 14.1 % (ref 11.5–15.5)
WBC: 21.4 10*3/uL — AB (ref 4.0–10.5)

## 2014-10-25 LAB — I-STAT CG4 LACTIC ACID, ED
LACTIC ACID, VENOUS: 5.07 mmol/L — AB (ref 0.5–2.0)
Lactic Acid, Venous: 3.81 mmol/L (ref 0.5–2.0)

## 2014-10-25 MED ORDER — ACETAMINOPHEN 650 MG RE SUPP
650.0000 mg | Freq: Four times a day (QID) | RECTAL | Status: DC | PRN
  Administered 2014-10-26 – 2014-10-27 (×3): 650 mg via RECTAL
  Filled 2014-10-25 (×3): qty 1

## 2014-10-25 MED ORDER — ONDANSETRON HCL 4 MG PO TABS: 4.0000 mg | ORAL_TABLET | Freq: Four times a day (QID) | ORAL | Status: DC | PRN

## 2014-10-25 MED ORDER — PIPERACILLIN-TAZOBACTAM IN DEX 2-0.25 GM/50ML IV SOLN
2.2500 g | Freq: Four times a day (QID) | INTRAVENOUS | Status: DC
  Administered 2014-10-25 – 2014-10-26 (×2): 2.25 g via INTRAVENOUS
  Filled 2014-10-25 (×4): qty 50

## 2014-10-25 MED ORDER — SODIUM CHLORIDE 0.9 % IV BOLUS (SEPSIS)
500.0000 mL | INTRAVENOUS | Status: AC
  Administered 2014-10-25: 500 mL via INTRAVENOUS

## 2014-10-25 MED ORDER — VANCOMYCIN HCL IN DEXTROSE 1-5 GM/200ML-% IV SOLN: 1000.0000 mg | INTRAVENOUS | Status: DC

## 2014-10-25 MED ORDER — VANCOMYCIN HCL IN DEXTROSE 1-5 GM/200ML-% IV SOLN
1000.0000 mg | Freq: Once | INTRAVENOUS | Status: AC
  Administered 2014-10-25: 1000 mg via INTRAVENOUS
  Filled 2014-10-25: qty 200

## 2014-10-25 MED ORDER — SODIUM CHLORIDE 0.9 % IV BOLUS (SEPSIS)
1000.0000 mL | Freq: Once | INTRAVENOUS | Status: AC
  Administered 2014-10-25: 1000 mL via INTRAVENOUS

## 2014-10-25 MED ORDER — ACETAMINOPHEN 650 MG RE SUPP
650.0000 mg | Freq: Once | RECTAL | Status: AC
  Administered 2014-10-25: 650 mg via RECTAL
  Filled 2014-10-25: qty 1

## 2014-10-25 MED ORDER — LORAZEPAM 2 MG/ML IJ SOLN
0.5000 mg | Freq: Four times a day (QID) | INTRAMUSCULAR | Status: DC | PRN
  Administered 2014-10-26: 0.5 mg via INTRAVENOUS
  Filled 2014-10-25: qty 1

## 2014-10-25 MED ORDER — HYDROCODONE-ACETAMINOPHEN 5-325 MG PO TABS: 1.0000 | ORAL_TABLET | ORAL | Status: DC | PRN

## 2014-10-25 MED ORDER — SODIUM CHLORIDE 0.9 % IV BOLUS (SEPSIS)
1000.0000 mL | INTRAVENOUS | Status: AC
  Administered 2014-10-25 (×2): 1000 mL via INTRAVENOUS

## 2014-10-25 MED ORDER — SODIUM CHLORIDE 0.9 % IJ SOLN: 3.0000 mL | INTRAMUSCULAR | Status: DC | PRN

## 2014-10-25 MED ORDER — SODIUM CHLORIDE 0.9 % IV SOLN: 250.0000 mL | INTRAVENOUS | Status: DC | PRN

## 2014-10-25 MED ORDER — ENOXAPARIN SODIUM 30 MG/0.3ML ~~LOC~~ SOLN
30.0000 mg | SUBCUTANEOUS | Status: DC
  Administered 2014-10-26: 30 mg via SUBCUTANEOUS
  Filled 2014-10-25 (×3): qty 0.3

## 2014-10-25 MED ORDER — ONDANSETRON HCL 4 MG/2ML IJ SOLN: 4.0000 mg | Freq: Four times a day (QID) | INTRAMUSCULAR | Status: DC | PRN

## 2014-10-25 MED ORDER — PIPERACILLIN-TAZOBACTAM 3.375 G IVPB 30 MIN
3.3750 g | Freq: Once | INTRAVENOUS | Status: AC
Start: 2014-10-25 — End: 2014-10-25
  Administered 2014-10-25: 3.375 g via INTRAVENOUS
  Filled 2014-10-25 (×2): qty 50

## 2014-10-25 MED ORDER — MORPHINE SULFATE 2 MG/ML IJ SOLN
2.0000 mg | Freq: Once | INTRAMUSCULAR | Status: AC
  Administered 2014-10-25: 2 mg via INTRAVENOUS
  Filled 2014-10-25 (×2): qty 1

## 2014-10-25 MED ORDER — SODIUM CHLORIDE 0.9 % IJ SOLN
3.0000 mL | Freq: Two times a day (BID) | INTRAMUSCULAR | Status: DC
  Administered 2014-10-25 – 2014-10-28 (×4): 3 mL via INTRAVENOUS

## 2014-10-25 MED ORDER — ALBUTEROL SULFATE (2.5 MG/3ML) 0.083% IN NEBU: 2.5000 mg | INHALATION_SOLUTION | RESPIRATORY_TRACT | Status: DC | PRN

## 2014-10-25 MED ORDER — ACETAMINOPHEN 325 MG PO TABS: 650.0000 mg | ORAL_TABLET | Freq: Four times a day (QID) | ORAL | Status: DC | PRN

## 2014-10-25 MED ORDER — MORPHINE SULFATE 2 MG/ML IJ SOLN
2.0000 mg | INTRAMUSCULAR | Status: DC | PRN
  Administered 2014-10-26 – 2014-10-27 (×10): 2 mg via INTRAVENOUS
  Filled 2014-10-25 (×9): qty 1

## 2014-10-25 NOTE — Progress Notes (Signed)
ANTIBIOTIC CONSULT NOTE - INITIAL  Pharmacy Consult for Vancomycin / Zosyn Indication: Sepsis  Allergies  Allergen Reactions  . Other Other (See Comments)    All narcotics. Pt. Is hard to manage    Patient Measurements: Height: 6' (182.9 cm) Weight: 176 lb (79.833 kg) IBW/kg (Calculated) : 77.6 Adjusted Body Weight:   Vital Signs: Temp: 102.6 F (39.2 C) (04/01 1539) Temp Source: Rectal (04/01 1539) BP: 124/106 mmHg (04/01 1539) Pulse Rate: 110 (04/01 1539) Intake/Output from previous day:   Intake/Output from this shift:    Labs: No results for input(s): WBC, HGB, PLT, LABCREA, CREATININE in the last 72 hours. CrCl cannot be calculated (Patient has no serum creatinine result on file.). No results for input(s): VANCOTROUGH, VANCOPEAK, VANCORANDOM, GENTTROUGH, GENTPEAK, GENTRANDOM, TOBRATROUGH, TOBRAPEAK, TOBRARND, AMIKACINPEAK, AMIKACINTROU, AMIKACIN in the last 72 hours.   Microbiology: No results found for this or any previous visit (from the past 720 hour(s)).  Medical History: Past Medical History  Diagnosis Date  . Thymoma     s/p resection  . Acute cholecystitis 11/2011    s/p perc drain 12/03/11  . Basal cell carcinoma     multiple areas  . Prostate cancer   . Stroke   . Arthritis   . Depression   . Dementia     Short term  . Head injury     Concussion- has had short term memory and it deterates .  Marland Kitchen Cholecystitis     S/P cholecytostomy tube  . Dementia   . Stroke   . Hyperparathyroidism     Assessment: 44 yoM with PMHx dementia, SBO, CVA, thymoma and prostate cancer presents from NH with increased lethargy, vomiting, and diarrhea, found to be febrile, tachycardic, hypotensive with Sp02 88% on RA.  Chest x-ray ordered.  Pharmacy consulted to start Vancomycin and Zosyn for sepsis.  First doses ordered in ED.    4/1 >> Vancomycin  >> 4/1 >> Zosyn  >>    Tmax: 102.6 WBCs: Elevated, 21.4K Renal: AKI - SCr 2.71, CrCl 21 (N20) Lactic Acid: 5.07  (4/1)  4/1 blood: collected 4/1 urine: collected  Goal of Therapy:  Vancomycin trough level 15-20 mcg/ml  Eradication of infection  Plan:  Vancomycin 1g IV q48h  Zosyn 2.25g IV q6h BMET tomorrow morning -f/u to adjust doses as CrCl is borderline  Ralene Bathe, PharmD, BCPS 10/25/2014, 7:16 PM  Pager: [phone]

## 2014-10-25 NOTE — ED Notes (Signed)
Bed: HJ64 Expected date:  Expected time:  Means of arrival:  Comments: EMS- elderly, lethargic, PNA

## 2014-10-25 NOTE — Progress Notes (Signed)
Patient's family agreeable to speak with chaplain.  Jeannett Senior made aware.

## 2014-10-25 NOTE — Progress Notes (Signed)
EDCM spoke to patient's family while patient getting xray.  Patient is currently a resident at [facility].  Patient's daughter Otila Kluver [phone] confirms patient's pcp is located at [facility] in [place].  Patient's daughter reports the patient does not see anyone in particular.  Patient's daughter reports the physician at [facility] has sent the patient here today.  Patient's daughter thinks doctor at [facility] is Dr. Deforest Hoyles.  Patient's daughter does not recall receiving a discharge follow up phone call.  No further EDCM needs at this time.

## 2014-10-25 NOTE — ED Notes (Signed)
[facility] presents with a 81 yo male from [place] with lethargy for 3 days leading to having vomiting and one episode of diarrhea today and hx. Of advanced stage dementia.  Pt increasingly became lethargic over a three day period, prior to this patient more active and alert than appears today.  Pt had one episode of a large amount of vomiting and one episode of diarrhea before staff at rehab center called for G[facility]#MS.  Pt was found by [facility] tachycardic, ATs below 90% on room air and lethargic.  2 liters of O2 given with improvement to 94% and 500 ml bolus given IV.

## 2014-10-25 NOTE — ED Notes (Signed)
PA made aware of patient  CG4 lactic result.

## 2014-10-25 NOTE — Progress Notes (Addendum)
CSW attempted to meet with pt at bedside. However, the pt was asleep. Family was present. Family confirms that the pt is from [facility]. According to daughter, the pt has been staying at the facility for the past 3 weeks. Daughter states that he began staying at the facility due to having a bowel obstruction and is now there for rehab.  Daughter confirms that the pt presents to [facility] today due to N/V.  Also, she states while at the facility the pt receives assistance with completing his ADL's. CSW asked if the pt has fallen within the past 6 months. Daughter replied " Oh yeah, I'm sure he has."  Daughter informed CSW that she is the pt's primary support and his POA. Daughter appears to be supportive and states she visits the pt at least once a week. Daughter states that she lives in [place].  Daughter states that she does not have any questions.   Daughter/Tina Harrel [phone]  Willette Brace [phone] ED CSW 10/25/2014 9:49 PM

## 2014-10-25 NOTE — ED Provider Notes (Signed)
CSN: [id number]     Arrival date & time 10/25/14  1521 History   First MD Initiated Contact with Patient 10/25/14 1559     Chief Complaint  Patient presents with  . Altered Mental Status  . Pneumonia  . Abdominal Pain     (Consider location/radiation/quality/duration/timing/severity/associated sxs/prior Treatment) HPI   Jose Crawford is a 81 y.o. male  with a hx of SBO, thymomoa, CVA, dementia, prostate cancer presents to the Emergency Department via EMS from SNF with increased AMS, fever, nausea and vomiting.  Level 5 caveat for AMS.    Pt is febrile and septic.  SIL at bedside reports SBO in early March 2016 managed medically. RN note reports increasing lethargy over the last 3 days at SNF and then large bout of vomiting and diarrhea prior to arrival in the ED.    Paperwork send with patient reports pt evaluated by Crist Infante, NP at [facility] today at 12:33 and was sent to ED for increased lethargy and generalized weakness.  Pt was sent to SNF for deconditioning after SBO on 09/23/14.  Note reports that pt was able to eat breakfast and was afebrile at the time of evaluation.    Past Medical History  Diagnosis Date  . Thymoma     s/p resection  . Acute cholecystitis 11/2011    s/p perc drain 12/03/11  . Basal cell carcinoma     multiple areas  . Prostate cancer   . Stroke   . Arthritis   . Depression   . Dementia     Short term  . Head injury     Concussion- has had short term memory and it deterates .  Marland Kitchen Cholecystitis     S/P cholecytostomy tube  . Dementia   . Stroke   . Hyperparathyroidism    Past Surgical History  Procedure Laterality Date  . Prostatectomy    . Colon surgery  1994    Removal of parts of colon  . Thoracotomy  01/13/2012    Procedure: THORACOTOMY MAJOR;  Surgeon: Gaye Pollack, MD;  Location: [facility] OR;  Service: Thoracic;  Laterality: Left;  LEFT THORACOTOMY, RESECTION OF THYMOMA  . Cholecystectomy  02/01/2012    Procedure:  LAPAROSCOPIC CHOLECYSTECTOMY WITH INTRAOPERATIVE CHOLANGIOGRAM;  Surgeon: Adin Hector, MD;  Location: [facility] ORS;  Service: General;  Laterality: N/A;  4 port   Family History  Problem Relation Age of Onset  . Cancer Mother   . Aneurysm Mother    History  Substance Use Topics  . Smoking status: Former Smoker    Types: Cigarettes    Quit date: 07/26/1968  . Smokeless tobacco: Never Used  . Alcohol Use: No    Review of Systems  Unable to perform ROS: Mental status change      Allergies  Other  Home Medications   Prior to Admission medications   Medication Sig Start Date End Date Taking? Authorizing Provider  acetaminophen (TYLENOL) 500 MG tablet Take 1,000 mg by mouth at bedtime.   Yes Historical Provider, MD  Ascorbic Acid (VITAMIN C PO) Take 2 capsules by mouth daily.   Yes Historical Provider, MD  Melatonin 5 MG CAPS Take 15 capsules by mouth at bedtime.   Yes Historical Provider, MD  Multiple Vitamins-Minerals (MULTIVITAMIN WITH MINERALS) tablet Take 1 tablet by mouth daily.   Yes Historical Provider, MD  Omega-3 Fatty Acids (FISH OIL PO) Take 2 capsules by mouth every evening.   Yes Historical Provider,  MD  [place] Wort 300 MG TABS Take 1 tablet by mouth at bedtime.   Yes Historical Provider, MD  TURMERIC PO Take 1,000 mg by mouth daily.   Yes Historical Provider, MD   BP 58/39 mmHg  Pulse 115  Temp(Src) 99.7 F (37.6 C) (Oral)  Resp 24  Ht 6' (1.829 m)  Wt 176 lb (79.833 kg)  BMI 23.86 kg/m2  SpO2 95% Physical Exam  Constitutional: He appears well-developed and well-nourished. No distress.  Ill appearing, diaphoretic  HENT:  Head: Normocephalic and atraumatic.  Mouth/Throat: Oropharynx is clear and moist. Mucous membranes are dry. No oropharyngeal exudate.  Dry mucous membranes  Eyes: Conjunctivae are normal. No scleral icterus.  Neck: Normal range of motion. Neck supple.  Cardiovascular: Regular rhythm, normal heart sounds and intact distal pulses.    Pulses:      Radial pulses are 1+ on the right side, and 1+ on the left side.  Tachycardia  Pulmonary/Chest: He is in respiratory distress. He has decreased breath sounds.  Equal chest expansion Decreased breath sounds bilaterally  Abdominal: Soft. He exhibits distension. There is tenderness.  Abd soft; with mild distention; tender  Musculoskeletal: Normal range of motion. He exhibits no edema.  Neurological: GCS eye subscore is 3. GCS verbal subscore is 2. GCS motor subscore is 4.  Skin: Skin is warm. He is diaphoretic. There is pallor.  Pale  Psychiatric: He has a normal mood and affect.  Nursing note and vitals reviewed.   ED Course  Procedures (including critical care time) Labs Review Labs Reviewed  CBC WITH DIFFERENTIAL/PLATELET - Abnormal; Notable for the following:    WBC 21.4 (*)    RBC 3.76 (*)    Hemoglobin 11.8 (*)    HCT 36.4 (*)    Neutrophils Relative % 93 (*)    Lymphocytes Relative 5 (*)    Monocytes Relative 2 (*)    Neutro Abs 19.9 (*)    All other components within normal limits  COMPREHENSIVE METABOLIC PANEL - Abnormal; Notable for the following:    Glucose, Bld 119 (*)    BUN 35 (*)    Creatinine, Ser 2.71 (*)    Calcium 10.9 (*)    Total Protein 5.8 (*)    Albumin 3.1 (*)    GFR calc non Af Amer 20 (*)    GFR calc Af Amer 23 (*)    All other components within normal limits  URINALYSIS, ROUTINE W REFLEX MICROSCOPIC - Abnormal; Notable for the following:    Color, Urine AMBER (*)    APPearance TURBID (*)    Hgb urine dipstick LARGE (*)    Bilirubin Urine SMALL (*)    Protein, ur 30 (*)    Leukocytes, UA LARGE (*)    All other components within normal limits  URINE MICROSCOPIC-ADD ON - Abnormal; Notable for the following:    Bacteria, UA MANY (*)    Crystals CA OXALATE CRYSTALS (*)    All other components within normal limits  I-STAT CG4 LACTIC ACID, ED - Abnormal; Notable for the following:    Lactic Acid, Venous 5.07 (*)    All other  components within normal limits  I-STAT CG4 LACTIC ACID, ED - Abnormal; Notable for the following:    Lactic Acid, Venous 3.81 (*)    All other components within normal limits  CULTURE, BLOOD (ROUTINE X 2)  CULTURE, BLOOD (ROUTINE X 2)  URINE CULTURE  LIPASE, BLOOD  BASIC METABOLIC PANEL  I-STAT CG4 LACTIC  ACID, ED    Imaging Review Dg Chest Port 1 View  10/25/2014   CLINICAL DATA:  Fever. Altered mental status. Vomiting. Advanced dementia.  EXAM: PORTABLE CHEST - 1 VIEW  COMPARISON:  229 eighteen acute abdomen series.  FINDINGS: Minimally motion degraded portable radiograph. Moderate right hemidiaphragm elevation. Midline trachea. Cardiomegaly accentuated by AP portable technique. Surgical clips along the left heart border. No pleural effusion or pneumothorax. Left mid lung opacity is chronic and likely related to scarring. There is also a right base atelectasis adjacent to the elevated right hemidiaphragm. No new pulmonary opacities are identified. No congestive failure. Right upper quadrant surgical clips.  IMPRESSION: Cardiomegaly and moderate right hemidiaphragm elevation. Scarring, but no acute disease.   Electronically Signed   By: Abigail Miyamoto M.D.   On: 10/25/2014 17:51   Dg Abd Portable 1v  10/25/2014   CLINICAL DATA:  Three days of worsening lethargy. Vomiting and diarrhea today.  EXAM: PORTABLE ABDOMEN - 1 VIEW  COMPARISON:  09/30/2014 radiographs.  09/23/2014 CT  FINDINGS: There is moderate dilatation and stacking of mid abdominal bowel loops, suspicious for small bowel obstruction. No free intraperitoneal air is evident. There is gas throughout the colon and stool in the rectum.  IMPRESSION: Abnormal dilatation and stacking of mid abdominal small bowel loops, suspicious for partial or early small bowel obstruction.   Electronically Signed   By: Andreas Newport M.D.   On: 10/25/2014 17:52     EKG Interpretation   Date/Time:  Friday October 25 2014 15:51:02 EDT Ventricular Rate:   110 PR Interval:  174 QRS Duration: 96 QT Interval:  362 QTC Calculation: 490 R Axis:   16 Text Interpretation:  Sinus tachycardia Borderline prolonged QT interval  No significant change was found Confirmed by [facility]  MD, TREY ([id number]) on  10/25/2014 3:58:25 PM       CRITICAL CARE Performed by: Abigail Butts Total critical care time: 1 hour Critical care time was exclusive of separately billable procedures and treating other patients. Critical care was necessary to treat or prevent imminent or life-threatening deterioration. Critical care was time spent personally by me on the following activities: development of treatment plan with patient and/or surrogate as well as nursing, discussions with consultants, evaluation of patient's response to treatment, examination of patient, obtaining history from patient or surrogate, ordering and performing treatments and interventions, ordering and review of laboratory studies, ordering and review of radiographic studies, pulse oximetry and re-evaluation of patient's condition.   MDM   Final diagnoses:  Fever  Abdominal pain  SBO (small bowel obstruction)  Septic shock  UTI (lower urinary tract infection)   Jose Crawford presents with sepsis.  Pt with hypoxia, fever, tachycardia and hypotension.  Pt with hx of dementia, increased AMS and unable to answer questions at this time. Abd tender on exam without rigidity.  Pt is hot to touch  4:34 PM Discussed pt's situation with SIL who was on the phone with pt's daughter (medical POA) at the time.  Pt is to be a DNR.    5:03 PM Lactic acid 5.07  5:22 PM Pt with increasing tachycardia.  Improved BP.  Labs and x-rays pending.  Pt has gotten abx.  7:24 PM Discussed with family (daughter who is POA and others) options for further treatment including intubation and central line placement for pressors.  Family wishes for no intubation or central line placement.  They wish for continued  antibiotics, but no seriously aggressive measures.  Patient with leukocytosis  of 21, lactic acid of greater than 5 and acute renal failure with a creatinine of 2.71.   No pneumonia on chest x-ray. Patient with abnormal dilation of midabdominal's small bowel loops suggestive for partial or early small bowel obstruction. We will insert NG tube for comfort and medical treatment. Consult hospitalist. Patient with blood pressure in the 41O systolic at this time.  The patient was discussed with and seen by Dr. Ralene Bathe who agrees with the treatment plan.  9:09 PM Pt admitted by Dr. Darnell Level.  He continues to have tachycardia and hypotension.  BP 58/39 mmHg  Pulse 115  Temp(Src) 99.7 F (37.6 C) (Oral)  Resp 24  Ht 6' (1.829 m)  Wt 176 lb (79.833 kg)  BMI 23.86 kg/m2  SpO2 95%   Abigail Butts, PA-C 10/25/14 2111  Quintella Reichert, MD 10/25/14 2336

## 2014-10-25 NOTE — Progress Notes (Signed)
Pt was asleep when I arrived. Pt's daughter, son-in-law, and grandson were bedside. Referred to Jose Crawford family by Ed nurse. Pt's daughter Jose Crawford) told me of the pt's diagnosis and said "it is all in God's hands."  She said her mother passed several years ago and she has been primary caregiver. I listened as daughter told me a little about pt and his faith and background. She said he had not been to church lately because of condition. She and family members wanted prayer and gathered around pt bedside during prayer. Pt's daughter was very appreciative of visit and prayer. Ernest Haber Chaplain   10/25/14 1800  Clinical Encounter Type  Visited With Family;Other (Comment)

## 2014-10-25 NOTE — ED Notes (Signed)
First liter finished at [place]; [place], [place] notified at bedside

## 2014-10-25 NOTE — ED Notes (Signed)
PA made aware of patient CG4 lactic result.

## 2014-10-25 NOTE — H&P (Signed)
PCP: [place]    Chief Complaint:  Lethargy, material and diarrhea  HPI: Jose Crawford is a 81 y.o. male   has a past medical history of Thymoma; Acute cholecystitis (11/2011); Basal cell carcinoma; Prostate cancer; Stroke; Arthritis; Depression; Dementia; Head injury; Cholecystitis; Dementia; Stroke; and Hyperparathyroidism.   Presented with  Patient was transferred by EMS from [place] facility if 3 days history of increased lethargy. If serum nausea and profuse vomiting. Today he had one episode of diarrhea. At his baseline patient has advanced dementia but is usually more active and alert. On arrival to emergency department patient was loaded to be tachycardic this height is 152 his blood pressure gradually declined down to 58/30 daily. Despite aggressive fluid resuscitation. Patient is suspected to have severe sepsis with septic shock This was discussed by ER physician the family his prognosis is very poor at this point family chose for comfort measures. They do not wish patient to be intubated they do not wish and initiation of pressors  Patient had been recently admitted from 29th of February 2 10. March for small bowel obstruction with conservative management His hospital stay was complicated by diarrhea which was C. difficile negative.    Hospitalist was called for admission for comfort care admission for septic shock  Review of Systems: unable to obtain due to patient's dementia Past Medical History: Past Medical History  Diagnosis Date  . Thymoma     s/p resection  . Acute cholecystitis 11/2011    s/p perc drain 12/03/11  . Basal cell carcinoma     multiple areas  . Prostate cancer   . Stroke   . Arthritis   . Depression   . Dementia     Short term  . Head injury     Concussion- has had short term memory and it deterates .  Marland Kitchen Cholecystitis     S/P cholecytostomy tube  . Dementia   . Stroke   . Hyperparathyroidism     Past Surgical History  Procedure Laterality Date  . Prostatectomy    . Colon surgery  1994    Removal of parts of colon  . Thoracotomy  01/13/2012    Procedure: THORACOTOMY MAJOR;  Surgeon: Gaye Pollack, MD;  Location: [facility] OR;  Service: Thoracic;  Laterality: Left;  LEFT THORACOTOMY, RESECTION OF THYMOMA  . Cholecystectomy  02/01/2012    Procedure: LAPAROSCOPIC CHOLECYSTECTOMY WITH INTRAOPERATIVE CHOLANGIOGRAM;  Surgeon: Adin Hector, MD;  Location: [facility] ORS;  Service: General;  Laterality: N/A;  4 port     Medications: Prior to Admission medications   Medication Sig Start Date End Date Taking? Authorizing Provider  acetaminophen (TYLENOL) 500 MG tablet Take 1,000 mg by mouth at bedtime.   Yes Historical Provider, MD  Ascorbic Acid (VITAMIN C PO) Take 2 capsules by mouth daily.   Yes Historical Provider, MD  Melatonin 5 MG CAPS Take 15 capsules by mouth at bedtime.   Yes Historical Provider, MD  Multiple Vitamins-Minerals (MULTIVITAMIN WITH MINERALS) tablet Take 1 tablet by mouth daily.   Yes Historical Provider, MD  Omega-3 Fatty Acids (FISH OIL PO) Take 2 capsules by mouth every evening.   Yes Historical Provider, MD  [facility] Wort 300 MG TABS Take 1 tablet by mouth at bedtime.   Yes Historical Provider, MD  TURMERIC PO Take 1,000 mg by mouth daily.   Yes Historical Provider, MD   Allergies:   Allergies  Allergen Reactions  . Other Other (  See Comments)    All narcotics. Pt. Is hard to manage    Social History:  Ambulatory  From facility [facility]. daughter Otila Kluver [phone]   reports that he quit smoking about 46 years ago. His smoking use included Cigarettes. He has never used smokeless tobacco. He reports that he does not drink alcohol or use illicit drugs.    Family History: family history includes Aneurysm in his mother; Cancer in his mother.    Physical Exam: Patient Vitals for the past 24 hrs:  BP Temp Temp src Pulse Resp SpO2 Height Weight   10/25/14 1940 (!) 58/39 mmHg 99.7 F (37.6 C) Oral 115 24 95 % - -  10/25/14 1830 (!) 60/40 mmHg - - 113 (!) 29 95 % - -  10/25/14 1820 (!) 59/38 mmHg - - 116 (!) 28 94 % - -  10/25/14 1810 (!) 58/36 mmHg - - 117 (!) 28 94 % - -  10/25/14 1800 (!) 58/36 mmHg - - (!) 121 (!) 30 96 % - -  10/25/14 1750 (!) 58/43 mmHg - - (!) 128 (!) 28 97 % - -  10/25/14 1740 (!) 90/45 mmHg - - (!) 140 23 97 % - -  10/25/14 1730 124/60 mmHg - - (!) 149 - 100 % - -  10/25/14 1720 128/78 mmHg - - (!) 153 - 91 % - -  10/25/14 1700 130/97 mmHg - - (!) 148 (!) 32 91 % - -  10/25/14 1615 (!) 67/46 mmHg - - (!) 33 25 (!) 52 % - -  10/25/14 1600 (!) 71/39 mmHg - - 110 25 96 % - -  10/25/14 1545 (!) 67/39 mmHg - - 109 26 96 % - -  10/25/14 1539 (!) 124/106 mmHg 102.6 F (39.2 C) Rectal 110 23 (!) 88 % 6' (1.829 m) 79.833 kg (176 lb)    1. General:  in No Acute distress 2. Psychological: lethargic not Oriented 3. Head/ENT:     Dry Mucous Membranes                          Head Non traumatic, neck supple                          Normal Dentition 4. SKIN: normal   Skin turgor,  Skin clean Dry and intact no rash 5. Heart: Regular rate and rhythm no Murmur, Rub or gallop 6. Lungs: some  Crackles, coarse breath sounds 7. Abdomen: Soft, generalized tenderness, Non distended 8. Lower extremities: no clubbing, cyanosis, or edema 9. Neurologically Grossly intact, moving all 4 extremities equally 10. MSK: Normal range of motion  body mass index is 23.86 kg/(m^2).   Labs on Admission:   Results for orders placed or performed during the hospital encounter of 10/25/14 (from the past 24 hour(s))  Urinalysis with microscopic     Status: Abnormal   Collection Time: 10/25/14  4:19 PM  Result Value Ref Range   Color, Urine AMBER (A) YELLOW   APPearance TURBID (A) CLEAR   Specific Gravity, Urine 1.018 1.005 - 1.030   pH 5.5 5.0 - 8.0   Glucose, UA NEGATIVE NEGATIVE mg/dL   Hgb urine dipstick LARGE (A) NEGATIVE    Bilirubin Urine SMALL (A) NEGATIVE   Ketones, ur NEGATIVE NEGATIVE mg/dL   Protein, ur 30 (A) NEGATIVE mg/dL   Urobilinogen, UA 0.2 0.0 - 1.0 mg/dL   Nitrite NEGATIVE NEGATIVE  Leukocytes, UA LARGE (A) NEGATIVE  Urine microscopic-add on     Status: Abnormal   Collection Time: 10/25/14  4:19 PM  Result Value Ref Range   Squamous Epithelial / LPF RARE RARE   WBC, UA TOO NUMEROUS TO COUNT <3 WBC/hpf   RBC / HPF 21-50 <3 RBC/hpf   Bacteria, UA MANY (A) RARE   Crystals CA OXALATE CRYSTALS (A) NEGATIVE  I-Stat CG4 Lactic Acid, ED     Status: Abnormal   Collection Time: 10/25/14  4:25 PM  Result Value Ref Range   Lactic Acid, Venous 5.07 (HH) 0.5 - 2.0 mmol/L   Comment NOTIFIED PHYSICIAN   CBC WITH DIFFERENTIAL     Status: Abnormal   Collection Time: 10/25/14  5:10 PM  Result Value Ref Range   WBC 21.4 (H) 4.0 - 10.5 K/uL   RBC 3.76 (L) 4.22 - 5.81 MIL/uL   Hemoglobin 11.8 (L) 13.0 - 17.0 g/dL   HCT 36.4 (L) 39.0 - 52.0 %   MCV 96.8 78.0 - 100.0 fL   MCH 31.4 26.0 - 34.0 pg   MCHC 32.4 30.0 - 36.0 g/dL   RDW 14.1 11.5 - 15.5 %   Platelets 226 150 - 400 K/uL   Neutrophils Relative % 93 (H) 43 - 77 %   Lymphocytes Relative 5 (L) 12 - 46 %   Monocytes Relative 2 (L) 3 - 12 %   Eosinophils Relative 0 0 - 5 %   Basophils Relative 0 0 - 1 %   Neutro Abs 19.9 (H) 1.7 - 7.7 K/uL   Lymphs Abs 1.1 0.7 - 4.0 K/uL   Monocytes Absolute 0.4 0.1 - 1.0 K/uL   Eosinophils Absolute 0.0 0.0 - 0.7 K/uL   Basophils Absolute 0.0 0.0 - 0.1 K/uL   WBC Morphology TOXIC GRANULATION   Comprehensive metabolic panel     Status: Abnormal   Collection Time: 10/25/14  5:10 PM  Result Value Ref Range   Sodium 141 135 - 145 mmol/L   Potassium 3.7 3.5 - 5.1 mmol/L   Chloride 103 96 - 112 mmol/L   CO2 24 19 - 32 mmol/L   Glucose, Bld 119 (H) 70 - 99 mg/dL   BUN 35 (H) 6 - 23 mg/dL   Creatinine, Ser 2.71 (H) 0.50 - 1.35 mg/dL   Calcium 10.9 (H) 8.4 - 10.5 mg/dL   Total Protein 5.8 (L) 6.0 - 8.3 g/dL    Albumin 3.1 (L) 3.5 - 5.2 g/dL   AST 32 0 - 37 U/L   ALT 13 0 - 53 U/L   Alkaline Phosphatase 93 39 - 117 U/L   Total Bilirubin 0.8 0.3 - 1.2 mg/dL   GFR calc non Af Amer 20 (L) >90 mL/min   GFR calc Af Amer 23 (L) >90 mL/min   Anion gap 14 5 - 15  Lipase, blood     Status: None   Collection Time: 10/25/14  5:10 PM  Result Value Ref Range   Lipase 16 11 - 59 U/L  I-Stat CG4 Lactic Acid, ED (not at [facility])     Status: Abnormal   Collection Time: 10/25/14  7:58 PM  Result Value Ref Range   Lactic Acid, Venous 3.81 (HH) 0.5 - 2.0 mmol/L   Comment NOTIFIED PHYSICIAN     UA too numerous to count, many bacteria  No results found for: HGBA1C  Estimated Creatinine Clearance: 21.1 mL/min (by C-G formula based on Cr of 2.71).  BNP (last 3 results) No results  for input(s): PROBNP in the last 8760 hours.  Other results:  I have pearsonaly reviewed this: ECG REPORT  Rate:110  Rhythm: Sinus tachycardia  ST&T Change: No ischemic changes qtc 490   Filed Weights   10/25/14 1539  Weight: 79.833 kg (176 lb)     Cultures:    Component Value Date/Time   SDES PERITONEAL CAVITY 01/31/2012 [place] 01/31/2012 1324   SPECREQUEST Normal 01/31/2012 1324   SPECREQUEST Normal 01/31/2012 1324   CULT MODERATE ESCHERICHIA COLI 01/31/2012 1324   CULT NO ANAEROBES ISOLATED 01/31/2012 1324   REPTSTATUS 02/03/2012 FINAL 01/31/2012 1324   REPTSTATUS 02/05/2012 FINAL 01/31/2012 1324     Radiological Exams on Admission: Dg Chest Port 1 View  10/25/2014   CLINICAL DATA:  Fever. Altered mental status. Vomiting. Advanced dementia.  EXAM: PORTABLE CHEST - 1 VIEW  COMPARISON:  229 eighteen acute abdomen series.  FINDINGS: Minimally motion degraded portable radiograph. Moderate right hemidiaphragm elevation. Midline trachea. Cardiomegaly accentuated by AP portable technique. Surgical clips along the left heart border. No pleural effusion or pneumothorax. Left mid lung opacity is  chronic and likely related to scarring. There is also a right base atelectasis adjacent to the elevated right hemidiaphragm. No new pulmonary opacities are identified. No congestive failure. Right upper quadrant surgical clips.  IMPRESSION: Cardiomegaly and moderate right hemidiaphragm elevation. Scarring, but no acute disease.   Electronically Signed   By: Abigail Miyamoto M.D.   On: 10/25/2014 17:51   Dg Abd Portable 1v  10/25/2014   CLINICAL DATA:  Three days of worsening lethargy. Vomiting and diarrhea today.  EXAM: PORTABLE ABDOMEN - 1 VIEW  COMPARISON:  09/30/2014 radiographs.  09/23/2014 CT  FINDINGS: There is moderate dilatation and stacking of mid abdominal bowel loops, suspicious for small bowel obstruction. No free intraperitoneal air is evident. There is gas throughout the colon and stool in the rectum.  IMPRESSION: Abnormal dilatation and stacking of mid abdominal small bowel loops, suspicious for partial or early small bowel obstruction.   Electronically Signed   By: Andreas Newport M.D.   On: 10/25/2014 17:52    Chart has been reviewed  Assessment/Plan 81 yo m here with SBO and Septic shock due to UTI on comfort care, prognosis hours to days.   Present on Admission:  . SBO (small bowel obstruction) - conservative management, at this point no nausea or vomiting hold off on NG due to comfort measures . Septic shock - family wishes for comfort care, continue antibiotics avoid over agressive fluids given increasing work of breathing and patient being on comfort care. IV morphine PRN pain and air hunger. Very poor prognosis hours to days   . UTI (lower urinary tract infection) - for now cover with zosyn   Prophylaxis:  Lovenox,   CODE STATUS: DNR/DNI comfort care  Other plan as per orders.  I have spent a total of 65 min on this admission extra time was taken on goals of care discussion.  [place] 10/25/2014, 8:21 PM  [facility]  Pager [phone]   after 2 AM  please page floor coverage PA If 7AM-7PM, please contact the day team taking care of the patient  [email or web address]  Password [email or web address]

## 2014-10-26 DIAGNOSIS — N390 Urinary tract infection, site not specified: Secondary | ICD-10-CM

## 2014-10-26 DIAGNOSIS — R1084 Generalized abdominal pain: Secondary | ICD-10-CM

## 2014-10-26 DIAGNOSIS — K5669 Other intestinal obstruction: Secondary | ICD-10-CM

## 2014-10-26 DIAGNOSIS — A419 Sepsis, unspecified organism: Secondary | ICD-10-CM | POA: Insufficient documentation

## 2014-10-26 DIAGNOSIS — R109 Unspecified abdominal pain: Secondary | ICD-10-CM | POA: Insufficient documentation

## 2014-10-26 DIAGNOSIS — R6520 Severe sepsis without septic shock: Secondary | ICD-10-CM

## 2014-10-26 DIAGNOSIS — Z7189 Other specified counseling: Secondary | ICD-10-CM | POA: Insufficient documentation

## 2014-10-26 LAB — BASIC METABOLIC PANEL
Anion gap: 11 (ref 5–15)
BUN: 38 mg/dL — ABNORMAL HIGH (ref 6–23)
CO2: 18 mmol/L — AB (ref 19–32)
Calcium: 9.4 mg/dL (ref 8.4–10.5)
Chloride: 109 mmol/L (ref 96–112)
Creatinine, Ser: 2.8 mg/dL — ABNORMAL HIGH (ref 0.50–1.35)
GFR calc Af Amer: 22 mL/min — ABNORMAL LOW (ref >90)
GFR calc non Af Amer: 19 mL/min — ABNORMAL LOW (ref >90)
Glucose, Bld: 88 mg/dL (ref 70–99)
POTASSIUM: 4.4 mmol/L (ref 3.5–5.1)
Sodium: 138 mmol/L (ref 135–145)

## 2014-10-26 MED ORDER — LORAZEPAM 2 MG/ML IJ SOLN
0.5000 mg | INTRAMUSCULAR | Status: DC | PRN
  Administered 2014-10-26 – 2014-10-28 (×10): 0.5 mg via INTRAVENOUS
  Filled 2014-10-26 (×10): qty 1

## 2014-10-26 NOTE — Progress Notes (Signed)
Pt's grandson was bedside when I arrived. Pt was resting. Grandson said they are just making pt comfortable now and "it was pretty much almost down to the wire." Asked grandson to let his mom know I'm thinking of her and family and have nurse page if they need. He was thankful for visit. Ernest Haber Chaplain   10/26/14 1300  Clinical Encounter Type  Visited With Family

## 2014-10-26 NOTE — Progress Notes (Signed)
Pt arrived to unit room 1505 via stretcher. . VS taken BP 74/47 97.8 axillary, resp 24 02 95% 2L.Marland Kitchen Pt lethargic. Pts daughter and family members at bedside.Initial assessment completed. Will continue to monitor and intervene as appropriate.comfort care measures in place

## 2014-10-26 NOTE — Progress Notes (Addendum)
[facility] PROGRESS NOTE  Jose Crawford HAL:[id number] DOB: [birth year] DOA: 10/25/2014 PCP: [place]  Assessment/Plan: 1. Goals of care. -Held 30 minute discussion with his daughter discussing goals of care. Patient with multiple comorbidities, presenting with sepsis secondary to UTI versus intra-abdominal infection, with blood cultures growing gram-negative rods. Family members expressing wishes to focus his care on comfort rather than pursuing further aggressive or invasive interventions including administration of IV pressors. A DO NOT RESUSCITATE. I discussed antimicrobial therapy which would not change and point nor make him comfortable/improved quality of life. They agreed with discontinuing antimicrobial therapy.   2.  Severe Sepsis. -Present on admission, evidenced by positive blood cultures, hypotension, acute encephalopathy, acute kidney injury, with source of infection likely to be UTI versus intra-abdominal. -He initially had been started on empiric IV antimicrobial therapy with vancomycin and Zosyn. Goals of care discussed with family members today explained that antimicrobial therapy would likely not contributing to first. Antibiotic therapy has been discontinued.  3.  Small bowel obstruction -NG tube was not placed for comfort. He does not have evidence of nausea or vomiting. -Reported care only  Code Status: DO NOT RESUSCITATE Family Communication: Spoke to his daughter who was present at bedside Disposition Plan: Comfort care, I think he may pass in the next 24-48 hours   HPI/Subjective: Patient is a pleasant 81 year old gentleman with a past medical history of cognitive impairment, history of CVA, nursing home resident, who was transferred to the emergency room at [facility] on 10/25/2014 presenting with a steep functional decline, increasingly lethargic, having several episodes of nausea vomiting and diarrhea. Abdominal film  showed abnormal dilatation and stacking of mid abdominal small bowel loops suspicious for partial or early small bowel obstruction. Lab work revealed elevated creatinine of 2.8 with BUN of 38. His found to have elevated lactate of 5.07. Patient was given aggressive IV fluid resuscitation however remained hypotensive. Numbers did not wish to pursue further aggressive or invasive interventions and reported that he would've wanted his care to be focused on comfort at this point.    Objective: Filed Vitals:   10/26/14 0402  BP: 91/61  Pulse: 111  Temp: 98 F (36.7 C)  Resp: 26    Intake/Output Summary (Last 24 hours) at 10/26/14 1323 Last data filed at 10/25/14 1945  Gross per 24 hour  Intake   3750 ml  Output      0 ml  Net   3750 ml   Filed Weights   10/25/14 1539 10/25/14 2157  Weight: 79.833 kg (176 lb) 79.6 kg (175 lb 7.8 oz)    Exam:   General:  Patient is nonresponsive, appears comfortable, in no acute distress   Cardiovascular: Tachycardic, regular rate and rhythm   Respiratory: Lungs overall clear to auscultation bilaterally no wheezing rhonchi or rales   Abdomen: Abdomen is mildly distended did not elicit pain to palpation   Musculoskeletal: No edema  Data Reviewed: Basic Metabolic Panel:  Recent Labs Lab 10/25/14 1710 10/26/14 0525  NA 141 138  K 3.7 4.4  CL 103 109  CO2 24 18*  GLUCOSE 119* 88  BUN 35* 38*  CREATININE 2.71* 2.80*  CALCIUM 10.9* 9.4   Liver Function Tests:  Recent Labs Lab 10/25/14 1710  AST 32  ALT 13  ALKPHOS 93  BILITOT 0.8  PROT 5.8*  ALBUMIN 3.1*    Recent Labs Lab 10/25/14 1710  LIPASE 16   No results for input(s): AMMONIA in the  last 168 hours. CBC:  Recent Labs Lab 10/25/14 1710  WBC 21.4*  NEUTROABS 19.9*  HGB 11.8*  HCT 36.4*  MCV 96.8  PLT 226   Cardiac Enzymes: No results for input(s): CKTOTAL, CKMB, CKMBINDEX, TROPONINI in the last 168 hours. BNP (last 3 results) No results for input(s): BNP in  the last 8760 hours.  ProBNP (last 3 results) No results for input(s): PROBNP in the last 8760 hours.  CBG: No results for input(s): GLUCAP in the last 168 hours.  Recent Results (from the past 240 hour(s))  Culture, blood (routine x 2)     Status: None (Preliminary result)   Collection Time: 10/25/14  4:07 PM  Result Value Ref Range Status   Specimen Description BLOOD LEFT ARM  3 ML IN [facility] BOTTLE  Final   Special Requests NONE  Final   Culture   Final    GRAM NEGATIVE RODS Note: [place] TO Abigail Butts Percell Miller [id number]$ BJENN Performed at :79KW    Report Status PENDING  Incomplete  Culture, blood (routine x 2)     Status: None (Preliminary result)   Collection Time: 10/25/14  4:17 PM  Result Value Ref Range Status   Specimen Description BLOOD LEFT HAND  3 ML IN [facility] BOTTLE  Final   Special Requests NONE  Final   Culture   Final    GRAM NEGATIVE RODS Note: Gram Stain Report Called to,Read Back By and Verified With: [facility] WILLARD [id number]@7 [facility] BJENN Performed at :40XB    Report Status PENDING  Incomplete     Studies: Dg Chest Port 1 View  10/25/2014   CLINICAL DATA:  Fever. Altered mental status. Vomiting. Advanced dementia.  EXAM: PORTABLE CHEST - 1 VIEW  COMPARISON:  229 eighteen acute abdomen series.  FINDINGS: Minimally motion degraded portable radiograph. Moderate right hemidiaphragm elevation. Midline trachea. Cardiomegaly accentuated by AP portable technique. Surgical clips along the left heart border. No pleural effusion or pneumothorax. Left mid lung opacity is chronic and likely related to scarring. There is also a right base atelectasis adjacent to the elevated right hemidiaphragm. No new pulmonary opacities are identified. No congestive failure. Right upper quadrant surgical clips.  IMPRESSION: Cardiomegaly and moderate right hemidiaphragm elevation. Scarring, but no acute disease.   Electronically Signed   By: 17/07/2014 M.D.   On: 10/25/2014  17:51   Dg Abd Portable 1v  10/25/2014   CLINICAL DATA:  Three days of worsening lethargy. Vomiting and diarrhea today.  EXAM: PORTABLE ABDOMEN - 1 VIEW  COMPARISON:  09/30/2014 radiographs.  09/23/2014 CT  FINDINGS: There is moderate dilatation and stacking of mid abdominal bowel loops, suspicious for small bowel obstruction. No free intraperitoneal air is evident. There is gas throughout the colon and stool in the rectum.  IMPRESSION: Abnormal dilatation and stacking of mid abdominal small bowel loops, suspicious for partial or early small bowel obstruction.   Electronically Signed   By: 10/06/2014 M.D.   On: 10/25/2014 17:52    Scheduled Meds: . enoxaparin (LOVENOX) injection  30 mg Subcutaneous Q24H  . sodium chloride  3 mL Intravenous Q12H   Continuous Infusions:   Active Problems:   SBO (small bowel obstruction)   Septic shock   UTI (lower urinary tract infection)    Time spent: 30 min    17/07/2014  [facility] Pager [phone]. If 7PM-7AM, please contact night-coverage at [email or web address], password [facility] 10/26/2014, 1:23 PM  LOS: 1 day

## 2014-10-26 NOTE — Progress Notes (Signed)
MD present on floor and alerted by writer of Gram + rods in pt's blood cultures.

## 2014-10-27 DIAGNOSIS — Z515 Encounter for palliative care: Secondary | ICD-10-CM | POA: Insufficient documentation

## 2014-10-27 DIAGNOSIS — R451 Restlessness and agitation: Secondary | ICD-10-CM | POA: Insufficient documentation

## 2014-10-27 MED ORDER — MORPHINE SULFATE 2 MG/ML IJ SOLN
2.0000 mg | Freq: Four times a day (QID) | INTRAMUSCULAR | Status: DC
  Administered 2014-10-28 (×3): 2 mg via INTRAVENOUS
  Filled 2014-10-27 (×4): qty 1

## 2014-10-27 MED ORDER — ATROPINE SULFATE 1 % OP SOLN
4.0000 [drp] | OPHTHALMIC | Status: DC | PRN
  Filled 2014-10-27: qty 2

## 2014-10-27 NOTE — Clinical Social Work Psychosocial (Signed)
Clinical Social Work Department BRIEF PSYCHOSOCIAL ASSESSMENT 10/27/2014  Patient:  Jose Crawford, Jose Crawford     Account Number:  [id number]     [place] date:  10/25/2014  Clinical Social Worker:  Dede Query, CLINICAL SOCIAL WORKER  Date/Time:  10/26/2014 01:00 PM  Referred by:  Physician  Date Referred:  10/26/2014 Referred for  Other - See comment   Other Referral:   Pt from [place] type:  Family Other interview type:   daughter Jose Crawford    PSYCHOSOCIAL DATA Living Status:  [place] Admitted from facility:  [facility] Level of care:  [place] Primary support name:  Jose Crawford Primary support relationship to patient:  CHILD, ADULT Degree of support available:   High/ Pt has been living with his daughter for past 7 years.  Recently went into SNF for rehab    CURRENT CONCERNS  Other Concerns:    SOCIAL WORK ASSESSMENT / PLAN CSW reviewed pt chart which reflected pt was from admitted from [facility].  Pt's chart reflected that MD noted pt may pass away within 24 to 48 hours so CSW met with pt and family at bedside.  CSW introduced herself and explained role.  CSW prompted family to discuss pt's history and current plan for services.  CSW provided positive support for daughter and family members at pt's bedside encouraging them to talk about pt, his history and current needs he or the family had.  CSW provided support for family regarding pt's status of being in the dying process and reflected the support they were providing to each other.  CSW explored pt and family's need for chaplain and explained that [place] and chaplain staff were available for pt and family at this time of need.   Assessment/plan status:  Psychosocial Support/Ongoing Assessment of Needs Other assessment/ plan:   Information/referral to community resources:    PATIENT'S/FAMILY'S RESPONSE TO PLAN OF CARE: Pt was not awake when CSW met with family at  his bedside. Pt's daughter stated that he responded at times when spoken to but most times remained asleep.  Pt's daughter stated that pt had been living with her since her mothers death from cancer seven years ago.  Pt's daughter stated that 3 weeks ago pt went to [facility] for rehab and was working towards gaining his strength.  Pt's daughter stated that pt "will not be going back to [facility] because he is close to dying".  Pt's daughter and family at bedside stated that pt is "good with the lord" and they do not need any spiritual support at this time.  Pt's daughter and family grateful for the offer of support and stated they were not needing any services at this time.    Dede Query, LCSW [place] Worker - Weekend Coverage cell #: [phone]

## 2014-10-27 NOTE — Consult Note (Signed)
Patient Jose Crawford      DOB: [birth year]      XBD:[id number]     Consult Note from the Palliative Medicine Team at [place] Requested by: Dr Roel Cluck     PCP: [facility] Reason for Consultation: [place]     Phone Number:[phone]  Assessment/Recommendations: 82 yo male with multiple medical issues as below. Re-admitted with recurrent SBO, probably sepsis, AKI, etc. Transitioned to comfort care  1.  Code Status: DNR  2. GOC: Comfort care I spoke with daughters at bedside and discussed care, answered questions.  They were surprised that he is still alive at this point. With his degree of illness and hypotension on admission, they though (as I suspect I would have) that he would pass in 24hrs.  We talked about how he may have potentially rallied some in light of fluid resuscitation and dose of IV abx.  Reinforced that he is still on dying trajectory and it is not all that uncommon to have a rally like this briefly.  We talked a lot about what to expect and changes they could see with him.  I think prognostically, we are very likely to see more decline in next hours to days, but his prognosis could extend into days to week range. I think I will have a better feel for this when I see him again tomorrow. We talked about potential hospice facility if things look like they may extend toward the week range.  Will evaluate again tomorrow.   I discussed all this with step-daughters at bedside and also again with daughter.   3. Symptom Management:   1. Abdominal Pain: mostly controlled at rest with PRNs. Has very tender abdomen to light touch and lots of discomfort with movement. I will go ahead and schedule low dose of morphine q6h and continue PRN dosing. Will re-assess in AM. i will also d/c lovenox for comfort 2. Agitation/Fevers: Ativan PRN controlling agitation. I will d/c tylenol suppository. If fevers making uncomfortable would favor using  ativan/morphine over suppository 3. Terminal Secretions: atropine drops PRN  4. 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 Very active in church community. Family feels he is in good place spiritually. I made them aware of chaplain availability should they request.    Brief HPI: 81 yo male with PMHx of Thymoma, dementia, CVA, prostate CA and recent admission for SBO.  He was discharged to SNF and re-admitted from [facility] with reported 3 day history of increasing lethargy.  On arrival to ED he was markedly tachycardic and hypotensive, leukocytosis, elevated lactate, AKI but did respond some to IV fluid boluses.  His KUB was again consistent with SBO and concern for sepsis given other findings. He did receive dose of IV vanc/zosyn but ultimately decision made to transition to comfort care only.  Palliative care consulted for assistance with this management.  At this time Jose Crawford is nonverbal. Seems to grimace at times and symptoms mostly controlled with PRNs per nursing and family.  Family surprised that his BP increased.     PMH:  Past Medical History  Diagnosis Date  . Thymoma     s/p resection  . Acute cholecystitis 11/2011    s/p perc drain 12/03/11  . Basal cell carcinoma     multiple areas  . Prostate cancer   . Stroke   . Arthritis   . Depression   . Dementia  Short term  . Head injury     Concussion- has had short term memory and it deterates .  Marland Kitchen Cholecystitis     S/P cholecytostomy tube  . Dementia   . Stroke   . Hyperparathyroidism      PSH: Past Surgical History  Procedure Laterality Date  . Prostatectomy    . Colon surgery  1994    Removal of parts of colon  . Thoracotomy  01/13/2012    Procedure: THORACOTOMY MAJOR;  Surgeon: Gaye Pollack, MD;  Location: [facility] OR;  Service: Thoracic;  Laterality: Left;  LEFT THORACOTOMY, RESECTION OF THYMOMA  . Cholecystectomy  02/01/2012    Procedure: LAPAROSCOPIC  CHOLECYSTECTOMY WITH INTRAOPERATIVE CHOLANGIOGRAM;  Surgeon: Adin Hector, MD;  Location: [facility] ORS;  Service: General;  Laterality: N/A;  4 port   I have reviewed the [place] and SH and  If appropriate update it with new information. Allergies  Allergen Reactions  . Other Other (See Comments)    All narcotics. Pt. Is hard to manage   Scheduled Meds: . enoxaparin (LOVENOX) injection  30 mg Subcutaneous Q24H  . sodium chloride  3 mL Intravenous Q12H   Continuous Infusions:  PRN Meds:.sodium chloride, acetaminophen **OR** acetaminophen, albuterol, HYDROcodone-acetaminophen, LORazepam, morphine injection, ondansetron **OR** ondansetron (ZOFRAN) IV, sodium chloride    BP 124/85 mmHg  Pulse 92  Temp(Src) 97.2 F (36.2 C) (Axillary)  Resp 11  Ht 6\' 1"  (1.854 m)  Wt 79.6 kg (175 lb 7.8 oz)  BMI 23.16 kg/m2  SpO2 94%   PPS: 10   Intake/Output Summary (Last 24 hours) at 10/27/14 1248 Last data filed at 10/26/14 1831  Gross per 24 hour  Intake    240 ml  Output      0 ml  Net    240 ml    Physical Exam:  General:  Lethargic, NAD HEENT:  [place], sclera anicteric Chest:   CTAB CVS:  RRR Abdomen: soft, diffusely tender to light palpation Ext:  No edema Neuro: minimally responsive Skin: warm  Labs: CBC    Component Value Date/Time   WBC 21.4* 10/25/2014 1710   WBC 9.3 12/21/2011 0910   RBC 3.76* 10/25/2014 1710   RBC 4.33 12/21/2011 0910   HGB 11.8* 10/25/2014 1710   HGB 13.3 12/21/2011 0910   HCT 36.4* 10/25/2014 1710   HCT 39.8 12/21/2011 0910   PLT 226 10/25/2014 1710   PLT 429* 12/21/2011 0910   MCV 96.8 10/25/2014 1710   MCV 91.9 12/21/2011 0910   MCH 31.4 10/25/2014 1710   MCH 30.6 12/21/2011 0910   MCHC 32.4 10/25/2014 1710   MCHC 33.3 12/21/2011 0910   RDW 14.1 10/25/2014 1710   RDW 13.5 12/21/2011 0910   LYMPHSABS 1.1 10/25/2014 1710   LYMPHSABS 1.8 12/21/2011 0910   MONOABS 0.4 10/25/2014 1710   MONOABS 0.5 12/21/2011 0910   EOSABS 0.0 10/25/2014 1710    EOSABS 0.4 12/21/2011 0910   BASOSABS 0.0 10/25/2014 1710   BASOSABS 0.1 12/21/2011 0910    BMET    Component Value Date/Time   NA 138 10/26/2014 0525   K 4.4 10/26/2014 0525   CL 109 10/26/2014 0525   CO2 18* 10/26/2014 0525   GLUCOSE 88 10/26/2014 0525   BUN 38* 10/26/2014 0525   CREATININE 2.80* 10/26/2014 0525   CALCIUM 9.4 10/26/2014 0525   GFRNONAA 19* 10/26/2014 0525   GFRAA 22* 10/26/2014 0525    CMP     Component Value Date/Time   NA 138  10/26/2014 0525   K 4.4 10/26/2014 0525   CL 109 10/26/2014 0525   CO2 18* 10/26/2014 0525   GLUCOSE 88 10/26/2014 0525   BUN 38* 10/26/2014 0525   CREATININE 2.80* 10/26/2014 0525   CALCIUM 9.4 10/26/2014 0525   PROT 5.8* 10/25/2014 1710   ALBUMIN 3.1* 10/25/2014 1710   AST 32 10/25/2014 1710   ALT 13 10/25/2014 1710   ALKPHOS 93 10/25/2014 1710   BILITOT 0.8 10/25/2014 1710   GFRNONAA 19* 10/26/2014 0525   GFRAA 22* 10/26/2014 0525   4/1 KUB IMPRESSION: Abnormal dilatation and stacking of mid abdominal small bowel loops, suspicious for partial or early small bowel obstruction.  4/1 CXR  IMPRESSION: Cardiomegaly and moderate right hemidiaphragm elevation. Scarring, but no acute disease.  Total Time: 80 minutes Greater than 50%  of this time was spent counseling and coordinating care related to the above assessment and plan.  Doran Clay D.O. Palliative Medicine Team at [facility]  Pager: [phone] Team Phone: [phone]

## 2014-10-27 NOTE — Progress Notes (Signed)
There were a number of family members present today including daughter, son-in-law and grandson. Most were tearful but said they were ok. On member also remarked they had already had prayer bedside. I encouraged them to have [place] paged whenever they felt like they needed. Jose Crawford (pts daughter) was grateful for Chaplain visit. Ernest Haber Chaplain   10/27/14 1600  Clinical Encounter Type  Visited With Family

## 2014-10-27 NOTE — Progress Notes (Signed)
[facility] PROGRESS NOTE  Jose Crawford HBZ:[id number] DOB: 07/24/1926 DOA: 10/25/2014 PCP: [place]  Assessment/Plan: 1. Goals of care. -Patient with multiple comorbidities, presenting with sepsis secondary to UTI versus intra-abdominal infection, with blood cultures growing gram-negative rods. Family members expressing wishes to focus his care on comfort rather than pursuing further aggressive or invasive interventions including administration of IV pressors. A DO NOT RESUSCITATE. I went over antimicrobial therapy which would not change and point nor make him comfortable/improved quality of life. They agreed with discontinuing antimicrobial therapy.   2.  Severe Sepsis. -Present on admission, evidenced by positive blood cultures, hypotension, acute encephalopathy, acute kidney injury, with source of infection likely to be UTI versus intra-abdominal. -He initially had been started on empiric IV antimicrobial therapy with vancomycin and Zosyn. Goals of care discussed with family members today explained that antimicrobial therapy would likely not contributing to first.  -Antimicrobial therapy has been discontinued. -Blood cultures growing gram-negative rods, organism identification pending at the time of this dictation.  3.  Small bowel obstruction -NG tube was not placed for comfort. He does not have evidence of nausea or vomiting. -Comfort care only  Code Status: DO NOT RESUSCITATE Family Communication: Spoke to his daughter who was present at bedside Disposition Plan: Comfort care, I think he may pass in the next 24-48 hours   HPI/Subjective: Patient is a pleasant 81 year old gentleman with a past medical history of cognitive impairment, history of CVA, nursing home resident, who was transferred to the emergency room at [facility] on 10/25/2014 presenting with a steep functional decline, increasingly lethargic, having several episodes of  nausea vomiting and diarrhea. Abdominal film showed abnormal dilatation and stacking of mid abdominal small bowel loops suspicious for partial or early small bowel obstruction. Lab work revealed elevated creatinine of 2.8 with BUN of 38. His found to have elevated lactate of 5.07. Patient was given aggressive IV fluid resuscitation however remained hypotensive. Numbers did not wish to pursue further aggressive or invasive interventions and reported that he would've wanted his care to be focused on comfort at this point.    Objective: Filed Vitals:   10/27/14 0513  BP: 124/85  Pulse: 92  Temp: 97.2 F (36.2 C)  Resp: 11    Intake/Output Summary (Last 24 hours) at 10/27/14 1256 Last data filed at 10/26/14 1831  Gross per 24 hour  Intake    240 ml  Output      0 ml  Net    240 ml   Filed Weights   10/25/14 1539 10/25/14 2157  Weight: 79.833 kg (176 lb) 79.6 kg (175 lb 7.8 oz)    Exam:   General:  Patient is nonresponsive, appears comfortable, in no acute distress   Cardiovascular: Tachycardic, regular rate and rhythm   Respiratory: Lungs overall clear to auscultation bilaterally no wheezing rhonchi or rales   Abdomen: Abdomen is mildly distended did not elicit pain to palpation   Musculoskeletal: No edema  Data Reviewed: Basic Metabolic Panel:  Recent Labs Lab 10/25/14 1710 10/26/14 0525  NA 141 138  K 3.7 4.4  CL 103 109  CO2 24 18*  GLUCOSE 119* 88  BUN 35* 38*  CREATININE 2.71* 2.80*  CALCIUM 10.9* 9.4   Liver Function Tests:  Recent Labs Lab 10/25/14 1710  AST 32  ALT 13  ALKPHOS 93  BILITOT 0.8  PROT 5.8*  ALBUMIN 3.1*    Recent Labs Lab 10/25/14 1710  LIPASE 16  No results for input(s): AMMONIA in the last 168 hours. CBC:  Recent Labs Lab 10/25/14 1710  WBC 21.4*  NEUTROABS 19.9*  HGB 11.8*  HCT 36.4*  MCV 96.8  PLT 226   Cardiac Enzymes: No results for input(s): CKTOTAL, CKMB, CKMBINDEX, TROPONINI in the last 168 hours. BNP  (last 3 results) No results for input(s): BNP in the last 8760 hours.  ProBNP (last 3 results) No results for input(s): PROBNP in the last 8760 hours.  CBG: No results for input(s): GLUCAP in the last 168 hours.  Recent Results (from the past 240 hour(s))  Culture, blood (routine x 2)     Status: None (Preliminary result)   Collection Time: 10/25/14  4:07 PM  Result Value Ref Range Status   Specimen Description BLOOD LEFT ARM  3 ML IN [facility] BOTTLE  Final   Special Requests NONE  Final   Culture   Final    GRAM NEGATIVE RODS Note: [place] TO WENDY WILLARD [id number]@7 :55AM BJENN Performed at [facility]    Report Status PENDING  Incomplete  Culture, blood (routine x 2)     Status: None (Preliminary result)   Collection Time: 10/25/14  4:17 PM  Result Value Ref Range Status   Specimen Description BLOOD LEFT HAND  3 ML IN [facility] BOTTLE  Final   Special Requests NONE  Final   Culture   Final    GRAM NEGATIVE RODS Note: Gram Stain Report Called to,Read Back By and Verified With: [facility] WILLARD [id number]@7 :55AM BJENN Performed at [facility]    Report Status PENDING  Incomplete  Urine culture     Status: None (Preliminary result)   Collection Time: 10/25/14  4:19 PM  Result Value Ref Range Status   Specimen Description URINE, CATHETERIZED  Final   Special Requests NONE  Final   Colony Count   Final    >=100,000 COLONIES/ML Performed at [facility]    Culture   Final    ESCHERICHIA COLI Performed at [facility]    Report Status PENDING  Incomplete     Studies: Dg Chest Port 1 View  10/25/2014   CLINICAL DATA:  Fever. Altered mental status. Vomiting. Advanced dementia.  EXAM: PORTABLE CHEST - 1 VIEW  COMPARISON:  229 eighteen acute abdomen series.  FINDINGS: Minimally motion degraded portable radiograph. Moderate right hemidiaphragm elevation. Midline trachea. Cardiomegaly accentuated by AP portable technique. Surgical clips along the left heart border.  No pleural effusion or pneumothorax. Left mid lung opacity is chronic and likely related to scarring. There is also a right base atelectasis adjacent to the elevated right hemidiaphragm. No new pulmonary opacities are identified. No congestive failure. Right upper quadrant surgical clips.  IMPRESSION: Cardiomegaly and moderate right hemidiaphragm elevation. Scarring, but no acute disease.   Electronically Signed   By: Abigail Miyamoto M.D.   On: 10/25/2014 17:51   Dg Abd Portable 1v  10/25/2014   CLINICAL DATA:  Three days of worsening lethargy. Vomiting and diarrhea today.  EXAM: PORTABLE ABDOMEN - 1 VIEW  COMPARISON:  09/30/2014 radiographs.  09/23/2014 CT  FINDINGS: There is moderate dilatation and stacking of mid abdominal bowel loops, suspicious for small bowel obstruction. No free intraperitoneal air is evident. There is gas throughout the colon and stool in the rectum.  IMPRESSION: Abnormal dilatation and stacking of mid abdominal small bowel loops, suspicious for partial or early small bowel obstruction.   Electronically Signed   By: Andreas Newport M.D.   On:  10/25/2014 17:52    Scheduled Meds: . enoxaparin (LOVENOX) injection  30 mg Subcutaneous Q24H  . sodium chloride  3 mL Intravenous Q12H   Continuous Infusions:   Active Problems:   SBO (small bowel obstruction)   Septic shock   UTI (lower urinary tract infection)   Abdominal pain   Severe sepsis   Goals of care, counseling/discussion    Time spent: 20 min    Kelvin Cellar  [facility] Pager [phone]. If 7PM-7AM, please contact night-coverage at [email or web address], password [facility] 10/27/2014, 12:56 PM  LOS: 2 days

## 2014-10-27 NOTE — Clinical Documentation Improvement (Signed)
Presents with Sepsis, Septic Shock, Encephalopathy, UTI.   Blood cultures positive from Gm + rods per Nursing Note on 10/26/14  For Sepsis specificity, please clarify the organism likely causing the sepsis.  _______Other Condition__________________ _______Cannot Clinically Determine    Thank You, Zoila Shutter ,RN Clinical Documentation Specialist:  [facility] [employer]

## 2014-10-27 NOTE — Progress Notes (Signed)
Nutrition Brief Note  Pt identified as at nutrition risk on the Malnutrition Screen Tool  Chart reviewed. Pt now transitioning to comfort care.  No nutrition interventions warranted at this time.  Please consult as needed.   Skyleen Bentley, MS, RD, LDN Pager: [phone] After Hours Pager: [phone]    

## 2014-10-28 DIAGNOSIS — R1084 Generalized abdominal pain: Secondary | ICD-10-CM

## 2014-10-28 LAB — URINE CULTURE: Colony Count: >=100000

## 2014-10-28 LAB — CULTURE, BLOOD (ROUTINE X 2)

## 2014-10-28 NOTE — Progress Notes (Signed)
Discharge instructions accompanied pt, left the unit in stable condition via ambulance to [facility] facility.

## 2014-10-28 NOTE — Discharge Summary (Signed)
Physician Discharge Summary  Jose Crawford CVE:[id number] DOB: 1925-10-01 DOA: 10/25/2014  PCP: [place] date: 10/25/2014 Discharge date: 10/28/2014  Time spent: 35 minutes  Recommendations for Outpatient Follow-up:  1. Patient discharged to [facility], inpatient hospice  Discharge Diagnoses:  Active Problems:   SBO (small bowel obstruction)   Septic shock   UTI (lower urinary tract infection)   Abdominal pain   Severe sepsis   Goals of care, counseling/discussion   Palliative care encounter   Agitation   Discharge Condition: Comfort care  Diet recommendation: As tolerated  Filed Weights   10/25/14 1539 10/25/14 2157  Weight: 79.833 kg (176 lb) 79.6 kg (175 lb 7.8 oz)    History of present illness:  Patient was transferred by EMS from [place] facility if 3 days history of increased lethargy. If serum nausea and profuse vomiting. Today he had one episode of diarrhea. At his baseline patient has advanced dementia but is usually more active and alert. On arrival to emergency department patient was loaded to be tachycardic this height is 152 his blood pressure gradually declined down to 58/30 daily. Despite aggressive fluid resuscitation. Patient is suspected to have severe sepsis with septic shock This was discussed by ER physician the family his prognosis is very poor at this point family chose for comfort measures. They do not wish patient to be intubated they do not wish and initiation of pressors  Hospital Course:  Patient is a pleasant 81 year old gentleman with a past medical history of cognitive impairment, history of CVA, nursing home resident, who was transferred to the emergency room at [facility] on 10/25/2014 presenting with a steep functional decline, increasingly lethargic, having several episodes of nausea vomiting and diarrhea. Abdominal film showed abnormal dilatation and stacking of mid abdominal small  bowel loops suspicious for partial or early small bowel obstruction. Lab work revealed elevated creatinine of 2.8 with BUN of 38. His found to have elevated lactate of 5.07. Patient was given aggressive IV fluid resuscitation however remained hypotensive. Family did not wish to pursue further aggressive or invasive interventions and reported that he would've wanted his care to be focused on comfort at this point.He was discharged to [facility].  Consultations:  Palliative Care  Discharge Exam: Filed Vitals:   10/28/14 0419  BP: 121/65  Pulse: 80  Temp: 97.6 F (36.4 C)  Resp: 10     General: Patient is nonresponsive, appears comfortable, in no acute distress   Cardiovascular: Tachycardic, regular rate and rhythm   Respiratory: Lungs overall clear to auscultation bilaterally no wheezing rhonchi or rales   Abdomen: Abdomen is mildly distended did not elicit pain to palpation   Musculoskeletal: No edema  Discharge Instructions   Discharge Instructions    Diet - low sodium heart healthy    Complete by:  As directed      Increase activity slowly    Complete by:  As directed           Current Discharge Medication List    STOP taking these medications     acetaminophen (TYLENOL) 500 MG tablet      Ascorbic Acid (VITAMIN C PO)      Melatonin 5 MG CAPS      Multiple Vitamins-Minerals (MULTIVITAMIN WITH MINERALS) tablet      Omega-3 Fatty Acids (FISH OIL PO)      St Johns Wort 300 MG TABS      TURMERIC PO  Allergies  Allergen Reactions  . Other Other (See Comments)    All narcotics. Pt. Is hard to manage      The results of significant diagnostics from this hospitalization (including imaging, microbiology, ancillary and laboratory) are listed below for reference.    Significant Diagnostic Studies: Dg Chest Port 1 View  10/25/2014   CLINICAL DATA:  Fever. Altered mental status. Vomiting. Advanced dementia.  EXAM: PORTABLE CHEST  - 1 VIEW  COMPARISON:  229 eighteen acute abdomen series.  FINDINGS: Minimally motion degraded portable radiograph. Moderate right hemidiaphragm elevation. Midline trachea. Cardiomegaly accentuated by AP portable technique. Surgical clips along the left heart border. No pleural effusion or pneumothorax. Left mid lung opacity is chronic and likely related to scarring. There is also a right base atelectasis adjacent to the elevated right hemidiaphragm. No new pulmonary opacities are identified. No congestive failure. Right upper quadrant surgical clips.  IMPRESSION: Cardiomegaly and moderate right hemidiaphragm elevation. Scarring, but no acute disease.   Electronically Signed   By: Abigail Miyamoto M.D.   On: 10/25/2014 17:51   Dg Abd Portable 1v  10/25/2014   CLINICAL DATA:  Three days of worsening lethargy. Vomiting and diarrhea today.  EXAM: PORTABLE ABDOMEN - 1 VIEW  COMPARISON:  09/30/2014 radiographs.  09/23/2014 CT  FINDINGS: There is moderate dilatation and stacking of mid abdominal bowel loops, suspicious for small bowel obstruction. No free intraperitoneal air is evident. There is gas throughout the colon and stool in the rectum.  IMPRESSION: Abnormal dilatation and stacking of mid abdominal small bowel loops, suspicious for partial or early small bowel obstruction.   Electronically Signed   By: Andreas Newport M.D.   On: 10/25/2014 17:52   Dg Abd Portable 1v  09/30/2014   CLINICAL DATA:  Complaining of feeling bad and moaning. Cannot express where the pain is. Nontender abdomen. Foley catheter and rectal tube.  EXAM: PORTABLE ABDOMEN - 1 VIEW  COMPARISON:  09/27/2014  FINDINGS: Nasogastric tube is no longer visible and may have been withdrawn or the note. Surgical clips overlie the right upper quadrant of the abdomen. Colonic contrast has largely passed from the gastrointestinal tract. Small amounts of contrast are identified within colonic loops. There is persistent mild dilatation of small bowel loops.  No evidence for free intraperitoneal air or pneumatosis.  IMPRESSION: 1. Interval elimination of majority of colonic contrast. 2. Persistent small bowel dilatation.   Electronically Signed   By: Nolon Nations M.D.   On: 09/30/2014 09:12    Microbiology: Recent Results (from the past 240 hour(s))  Culture, blood (routine x 2)     Status: None   Collection Time: 10/25/14  4:07 PM  Result Value Ref Range Status   Specimen Description BLOOD LEFT ARM  3 ML IN [facility] BOTTLE  Final   Special Requests NONE  Final   Culture   Final    ESCHERICHIA COLI Note: Gram Stain Report Called to,Read Back By and Verified With: [facility] WILLARD [id number]@7 :55AM BJENN Performed at [facility]    Report Status 10/28/2014 FINAL  Final   Organism ID, Bacteria ESCHERICHIA COLI  Final      Susceptibility   Escherichia coli - MIC*    AMPICILLIN <=2 SENSITIVE Sensitive     AMPICILLIN/SULBACTAM <=2 SENSITIVE Sensitive     CEFAZOLIN <=4 SENSITIVE Sensitive     CEFEPIME <=1 SENSITIVE Sensitive     CEFTAZIDIME <=1 SENSITIVE Sensitive     CEFTRIAXONE <=1 SENSITIVE Sensitive     CIPROFLOXACIN <=0.25  SENSITIVE Sensitive     GENTAMICIN <=1 SENSITIVE Sensitive     IMIPENEM <=0.25 SENSITIVE Sensitive     PIP/TAZO <=4 SENSITIVE Sensitive     TOBRAMYCIN <=1 SENSITIVE Sensitive     TRIMETH/SULFA <=20 SENSITIVE Sensitive     * ESCHERICHIA COLI  Culture, blood (routine x 2)     Status: None   Collection Time: 10/25/14  4:17 PM  Result Value Ref Range Status   Specimen Description BLOOD LEFT HAND  3 ML IN [facility] BOTTLE  Final   Special Requests NONE  Final   Culture   Final    ESCHERICHIA COLI Note: SUSCEPTIBILITIES PERFORMED ON PREVIOUS CULTURE WITHIN THE LAST 5 DAYS. Note: Gram Stain Report Called to,Read Back By and Verified With: [facility] WILLARD [id number]@7 :55AM BJENN Performed at [facility]    Report Status 10/28/2014 FINAL  Final  Urine culture     Status: None   Collection Time: 10/25/14  4:19 PM   Result Value Ref Range Status   Specimen Description URINE, CATHETERIZED  Final   Special Requests NONE  Final   Colony Count   Final    >=100,000 COLONIES/ML Performed at [facility]    Culture   Final    ESCHERICHIA COLI Performed at [facility]    Report Status 10/28/2014 FINAL  Final   Organism ID, Bacteria ESCHERICHIA COLI  Final      Susceptibility   Escherichia coli - MIC*    AMPICILLIN <=2 SENSITIVE Sensitive     CEFAZOLIN <=4 SENSITIVE Sensitive     CEFTRIAXONE <=1 SENSITIVE Sensitive     CIPROFLOXACIN <=0.25 SENSITIVE Sensitive     GENTAMICIN <=1 SENSITIVE Sensitive     LEVOFLOXACIN <=0.12 SENSITIVE Sensitive     NITROFURANTOIN <=16 SENSITIVE Sensitive     TOBRAMYCIN <=1 SENSITIVE Sensitive     TRIMETH/SULFA <=20 SENSITIVE Sensitive     PIP/TAZO <=4 SENSITIVE Sensitive     * ESCHERICHIA COLI     Labs: Basic Metabolic Panel:  Recent Labs Lab 10/25/14 1710 10/26/14 0525  NA 141 138  K 3.7 4.4  CL 103 109  CO2 24 18*  GLUCOSE 119* 88  BUN 35* 38*  CREATININE 2.71* 2.80*  CALCIUM 10.9* 9.4   Liver Function Tests:  Recent Labs Lab 10/25/14 1710  AST 32  ALT 13  ALKPHOS 93  BILITOT 0.8  PROT 5.8*  ALBUMIN 3.1*    Recent Labs Lab 10/25/14 1710  LIPASE 16   No results for input(s): AMMONIA in the last 168 hours. CBC:  Recent Labs Lab 10/25/14 1710  WBC 21.4*  NEUTROABS 19.9*  HGB 11.8*  HCT 36.4*  MCV 96.8  PLT 226   Cardiac Enzymes: No results for input(s): CKTOTAL, CKMB, CKMBINDEX, TROPONINI in the last 168 hours. BNP: BNP (last 3 results) No results for input(s): BNP in the last 8760 hours.  ProBNP (last 3 results) No results for input(s): PROBNP in the last 8760 hours.  CBG: No results for input(s): GLUCAP in the last 168 hours.     SignedKelvin Cellar  [facility] 10/28/2014, 3:08 PM

## 2014-10-28 NOTE — Progress Notes (Signed)
Patient Jose Crawford      DOB: [birth year]      HUD:[id number]   Palliative Medicine Team at [facility] Progress Note    Subjective: Nonverbal, no distress   Filed Vitals:   10/28/14 0419  BP: 121/65  Pulse: 80  Temp: 97.6 F (36.4 C)  Resp: 10   Physical exam: GEN: obtunded, appears comfortable HEENT: mmm CV: RRR LUNGS: CTAB ABD: distended, less tender EXT: warm    Assessment and plan:   81 yo male with multiple medical issues as below. Re-admitted with recurrent SBO, probably sepsis, AKI, etc. Transitioned to comfort care  1. Code Status: DNR  2. GOC: Comfort care Appears comfortable today. BP, respiratory pattern remain stable. i think his prognosis is more likely in the days to week range at this point. I discussed with daughter Otila Kluver and she agrees that we should proceed with residential hospice referral at this point.  I have placed SW consult to help arrange.   3. Symptom Management:  1. Abdominal Pain: Appears better today. Continue current regimen 2. Agitation/Fevers: Ativan PRN controlling agitation. I will d/c tylenol suppository. If fevers making uncomfortable would favor using ativan/morphine over suppository 3. Terminal Secretions: atropine drops PRN  4. 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 Very active in church community. Family feels he is in good place spiritually. I made them aware of chaplain availability should they request.    Doran Clay D.O. Palliative Medicine Team at [facility]  Pager: [phone] Team Phone: [phone]

## 2014-10-28 NOTE — Progress Notes (Signed)
Report called to Presenter, broadcasting at [facility]. Awaiting transportation.

## 2014-10-28 NOTE — Progress Notes (Signed)
CSW spoke with [facility] who confirmed that they had beds available. Pt daughter agreeable to referral to [facility] and for their staff to contact her.   Pt was accepted to [place] was scheduled for 3:45. DC packet completed and placed on chart. RN notified.   CSW signing off but available if needs arise.   Peri Maris, LCSWA 10/28/2014 5:30 PM [phone]

## 2014-10-28 NOTE — Consult Note (Signed)
[place] Liaison: Received request from [facility] for family interest in [facility]. Chart reviewed and spoke with daughter by phone to confirm interest. Dr. Orpah Melter to assume care per family request. Will need DC summary faxed to [phone]. RN please call report to [phone]. Thank you. Erling Conte LCSW [phone]

## 2014-11-24 DEATH — deceased
